# Patient Record
Sex: Female | Born: 1970 | Hispanic: No | Marital: Married | Smoking: Never smoker
Health system: Southern US, Community
[De-identification: ages and names within clinical notes are randomized; demographics above are authoritative.]

## PROBLEM LIST (undated history)

## (undated) DIAGNOSIS — N83209 Unspecified ovarian cyst, unspecified side: Secondary | ICD-10-CM

## (undated) DIAGNOSIS — N736 Female pelvic peritoneal adhesions (postinfective): Secondary | ICD-10-CM

## (undated) DIAGNOSIS — D649 Anemia, unspecified: Secondary | ICD-10-CM

## (undated) DIAGNOSIS — I10 Essential (primary) hypertension: Secondary | ICD-10-CM

## (undated) DIAGNOSIS — K259 Gastric ulcer, unspecified as acute or chronic, without hemorrhage or perforation: Secondary | ICD-10-CM

## (undated) HISTORY — PX: ECTOPIC PREGNANCY SURGERY: SHX613

## (undated) HISTORY — DX: Gastric ulcer, unspecified as acute or chronic, without hemorrhage or perforation: K25.9

## (undated) HISTORY — PX: OVARIAN CYST SURGERY: SHX726

## (undated) HISTORY — DX: Female pelvic peritoneal adhesions (postinfective): N73.6

## (undated) HISTORY — DX: Unspecified ovarian cyst, unspecified side: N83.209

## (undated) HISTORY — DX: Anemia, unspecified: D64.9

---

## 2000-02-11 ENCOUNTER — Ambulatory Visit (HOSPITAL_COMMUNITY): Admission: RE | Admit: 2000-02-11 | Discharge: 2000-02-11 | Payer: Self-pay | Admitting: *Deleted

## 2000-04-06 ENCOUNTER — Ambulatory Visit (HOSPITAL_COMMUNITY): Admission: RE | Admit: 2000-04-06 | Discharge: 2000-04-06 | Payer: Self-pay | Admitting: *Deleted

## 2000-09-07 ENCOUNTER — Inpatient Hospital Stay (HOSPITAL_COMMUNITY): Admission: AD | Admit: 2000-09-07 | Discharge: 2000-09-09 | Payer: Self-pay | Admitting: *Deleted

## 2000-12-14 ENCOUNTER — Inpatient Hospital Stay (HOSPITAL_COMMUNITY): Admission: AD | Admit: 2000-12-14 | Discharge: 2000-12-14 | Payer: Self-pay | Admitting: *Deleted

## 2000-12-14 ENCOUNTER — Encounter: Payer: Self-pay | Admitting: *Deleted

## 2000-12-17 ENCOUNTER — Inpatient Hospital Stay (HOSPITAL_COMMUNITY): Admission: AD | Admit: 2000-12-17 | Discharge: 2000-12-17 | Payer: Self-pay | Admitting: *Deleted

## 2001-01-04 ENCOUNTER — Encounter: Admission: RE | Admit: 2001-01-04 | Discharge: 2001-01-04 | Payer: Self-pay | Admitting: Obstetrics & Gynecology

## 2002-01-13 ENCOUNTER — Other Ambulatory Visit: Admission: RE | Admit: 2002-01-13 | Discharge: 2002-01-13 | Payer: Self-pay | Admitting: Obstetrics & Gynecology

## 2002-04-14 ENCOUNTER — Encounter: Payer: Self-pay | Admitting: Emergency Medicine

## 2002-04-14 ENCOUNTER — Emergency Department (HOSPITAL_COMMUNITY): Admission: EM | Admit: 2002-04-14 | Discharge: 2002-04-14 | Payer: Self-pay | Admitting: Emergency Medicine

## 2005-02-12 ENCOUNTER — Other Ambulatory Visit: Admission: RE | Admit: 2005-02-12 | Discharge: 2005-02-12 | Payer: Self-pay | Admitting: Obstetrics and Gynecology

## 2005-12-14 ENCOUNTER — Inpatient Hospital Stay (HOSPITAL_COMMUNITY): Admission: AD | Admit: 2005-12-14 | Discharge: 2005-12-15 | Payer: Self-pay | Admitting: Obstetrics and Gynecology

## 2005-12-19 ENCOUNTER — Inpatient Hospital Stay (HOSPITAL_COMMUNITY): Admission: AD | Admit: 2005-12-19 | Discharge: 2005-12-19 | Payer: Self-pay | Admitting: Obstetrics and Gynecology

## 2005-12-22 ENCOUNTER — Inpatient Hospital Stay (HOSPITAL_COMMUNITY): Admission: AD | Admit: 2005-12-22 | Discharge: 2005-12-22 | Payer: Self-pay | Admitting: Obstetrics and Gynecology

## 2005-12-25 ENCOUNTER — Inpatient Hospital Stay (HOSPITAL_COMMUNITY): Admission: AD | Admit: 2005-12-25 | Discharge: 2005-12-25 | Payer: Self-pay | Admitting: Obstetrics and Gynecology

## 2006-01-01 ENCOUNTER — Inpatient Hospital Stay (HOSPITAL_COMMUNITY): Admission: AD | Admit: 2006-01-01 | Discharge: 2006-01-01 | Payer: Self-pay | Admitting: Obstetrics and Gynecology

## 2006-01-10 ENCOUNTER — Inpatient Hospital Stay (HOSPITAL_COMMUNITY): Admission: AD | Admit: 2006-01-10 | Discharge: 2006-01-10 | Payer: Self-pay | Admitting: Obstetrics and Gynecology

## 2006-01-26 ENCOUNTER — Ambulatory Visit (HOSPITAL_COMMUNITY): Admission: RE | Admit: 2006-01-26 | Discharge: 2006-01-26 | Payer: Self-pay | Admitting: Obstetrics and Gynecology

## 2006-02-15 ENCOUNTER — Other Ambulatory Visit: Admission: RE | Admit: 2006-02-15 | Discharge: 2006-02-15 | Payer: Self-pay | Admitting: Obstetrics and Gynecology

## 2006-10-27 ENCOUNTER — Emergency Department (HOSPITAL_COMMUNITY): Admission: EM | Admit: 2006-10-27 | Discharge: 2006-10-27 | Payer: Self-pay | Admitting: Emergency Medicine

## 2006-11-30 HISTORY — PX: PELVIC LAPAROSCOPY: SHX162

## 2007-07-05 ENCOUNTER — Ambulatory Visit (HOSPITAL_BASED_OUTPATIENT_CLINIC_OR_DEPARTMENT_OTHER): Admission: RE | Admit: 2007-07-05 | Discharge: 2007-07-05 | Payer: Self-pay | Admitting: Obstetrics and Gynecology

## 2007-12-12 ENCOUNTER — Ambulatory Visit (HOSPITAL_COMMUNITY): Admission: RE | Admit: 2007-12-12 | Discharge: 2007-12-12 | Payer: Self-pay | Admitting: Obstetrics and Gynecology

## 2007-12-18 ENCOUNTER — Inpatient Hospital Stay (HOSPITAL_COMMUNITY): Admission: AD | Admit: 2007-12-18 | Discharge: 2007-12-18 | Payer: Self-pay | Admitting: Obstetrics and Gynecology

## 2008-04-12 ENCOUNTER — Inpatient Hospital Stay (HOSPITAL_COMMUNITY): Admission: AD | Admit: 2008-04-12 | Discharge: 2008-04-12 | Payer: Self-pay | Admitting: Obstetrics & Gynecology

## 2008-04-28 ENCOUNTER — Inpatient Hospital Stay (HOSPITAL_COMMUNITY): Admission: AD | Admit: 2008-04-28 | Discharge: 2008-04-30 | Payer: Self-pay | Admitting: Obstetrics and Gynecology

## 2008-04-29 ENCOUNTER — Encounter: Payer: Self-pay | Admitting: Obstetrics and Gynecology

## 2008-12-01 ENCOUNTER — Emergency Department (HOSPITAL_COMMUNITY): Admission: EM | Admit: 2008-12-01 | Discharge: 2008-12-01 | Payer: Self-pay | Admitting: Emergency Medicine

## 2010-11-06 ENCOUNTER — Emergency Department (HOSPITAL_COMMUNITY)
Admission: EM | Admit: 2010-11-06 | Discharge: 2010-11-06 | Payer: Self-pay | Source: Home / Self Care | Admitting: Emergency Medicine

## 2010-11-19 ENCOUNTER — Ambulatory Visit (HOSPITAL_COMMUNITY)
Admission: RE | Admit: 2010-11-19 | Discharge: 2010-11-19 | Payer: Self-pay | Source: Home / Self Care | Attending: Gastroenterology | Admitting: Gastroenterology

## 2010-11-27 ENCOUNTER — Ambulatory Visit (HOSPITAL_COMMUNITY)
Admission: RE | Admit: 2010-11-27 | Discharge: 2010-11-27 | Payer: Self-pay | Source: Home / Self Care | Attending: Gastroenterology | Admitting: Gastroenterology

## 2011-02-09 LAB — DIFFERENTIAL
Eosinophils Relative: 1 % (ref 0–5)
Lymphocytes Relative: 31 % (ref 12–46)
Monocytes Absolute: 0.4 10*3/uL (ref 0.1–1.0)
Monocytes Relative: 6 % (ref 3–12)
Neutro Abs: 4.5 10*3/uL (ref 1.7–7.7)
Neutrophils Relative %: 62 % (ref 43–77)

## 2011-02-09 LAB — URINALYSIS, ROUTINE W REFLEX MICROSCOPIC
Bilirubin Urine: NEGATIVE
Glucose, UA: NEGATIVE mg/dL
Ketones, ur: NEGATIVE mg/dL
Urobilinogen, UA: 0.2 mg/dL (ref 0.0–1.0)
pH: 7.5 (ref 5.0–8.0)

## 2011-02-09 LAB — CBC
HCT: 33.1 % — ABNORMAL LOW (ref 36.0–46.0)
MCV: 71.2 fL — ABNORMAL LOW (ref 78.0–100.0)
RBC: 4.65 MIL/uL (ref 3.87–5.11)
RDW: 16.7 % — ABNORMAL HIGH (ref 11.5–15.5)
WBC: 7.3 10*3/uL (ref 4.0–10.5)

## 2011-02-09 LAB — COMPREHENSIVE METABOLIC PANEL
Albumin: 3.9 g/dL (ref 3.5–5.2)
Alkaline Phosphatase: 56 U/L (ref 39–117)
BUN: 8 mg/dL (ref 6–23)
CO2: 26 mEq/L (ref 19–32)
Chloride: 107 mEq/L (ref 96–112)
Creatinine, Ser: 0.63 mg/dL (ref 0.4–1.2)
Glucose, Bld: 96 mg/dL (ref 70–99)
Potassium: 4 mEq/L (ref 3.5–5.1)
Total Bilirubin: 0.6 mg/dL (ref 0.3–1.2)
Total Protein: 7.6 g/dL (ref 6.0–8.3)

## 2011-02-09 LAB — LIPASE, BLOOD: Lipase: 28 U/L (ref 11–59)

## 2011-02-09 LAB — URINE MICROSCOPIC-ADD ON

## 2011-04-14 NOTE — Op Note (Signed)
Alisha Thomas, Alisha Thomas                ACCOUNT NO.:  [id number]   MEDICAL RECORD NO.:  [id number]          PATIENT TYPE:  AMB   LOCATION:  [facility]                         FACILITY:  [facility]   PHYSICIAN:  Daniel L. Gottsegen, M.D.DATE OF BIRTH:  03/02/1971   DATE OF PROCEDURE:  07/05/2007  DATE OF DISCHARGE:                               OPERATIVE REPORT   PREOPERATIVE DIAGNOSIS:  Pelvic adhesions.   POSTOPERATIVE DIAGNOSIS:  Pelvic adhesions.   OPERATIONS:  Diagnostic laparoscopy with lysis of peritubular,  paraovarian and pelvic adhesions.   SURGEON:  Daniel L. Eda Paschal, M.D.   ANESTHESIA:  General.   INDICATIONS:  The patient is a 40 year old gravida 3, para 1, AB 2 who  had presented to the office after two ectopic pregnancies.  One of them  had been treated with a right salpingectomy, the other been treated with  a linear salpingostomy.  The patient had a postoperative HSG after the  ectopic pregnancy on the left that showed a patent left tube, right tube  was not patent obviously as it had been removed.  The patient had also  had a previous laparotomy for an ovarian cystectomy in [place] on the left  side.  As a result of the above, it was felt that if the patient tried  to conceive, she would run a very high risk of another tubal pregnancy.  In addition, the patient actually not contraceptive now for a while and  had not even reconceived.  She now enters the hospital for laparoscopy  with lysis of adhesions if appropriate.   FINDINGS:  At the time of surgery, the patient's uterus was mobile,  normal size with several small seedling myoma coming off the posterior  wall, right ovary was somewhat adherent to the broad ligament, right  fallopian tube appeared to be almost completely gone from salpingectomy,  there was a small proximal segment.  On the left side, the patient had a  normal fallopian tube with luxuriant fimbria but it was somewhat kinked  and twisted as it was adherent  both to the ovary and to the broad  ligament as well as the ovary which was densely adherent to the broad  ligament.  This certainly would have effected tubal transfer of a embryo  back into the uterus.  When indigo carmine was introduced, the left tube  was patent.  There was also significant adhesive disease involving the  patient's previous vertical incision.   PROCEDURE:  After adequate general endotracheal anesthesia, the patient  was placed in dorsal lithotomy position, prepped and draped in the usual  sterile manner.  Her bladder was emptied with a Robinson catheter.  A  Jarco catheter was placed into the uterus  both to chromotubate as well  as to elevate the uterus.  A small subumbilical incision was made in  order to place the laparoscope.  It was entered with direct insertion  using an OptiVu to direct it.  This was done without any trauma to  underlying peritoneal structures.  Initially, a very poor view of the  pelvis could be obtained because of the  adhesions from her previous  surgery.  There were a lot of avascular areas in them.  There was no  bowel attached to them and therefore the Neodymium YAG laser was placed  through the operating channel of the laparoscope.  A G4 tip was placed  over the bare fiber, power setting was 12 watts and using the laser, all  the adhesions could be taken down so that now a complete view of the  pelvis could be obtained.  There were some adhesions higher up on the  right that were not taken down they did not involve the pelvis and there  was some concern that there might be bowel adherent to them.  At this  point, now the pelvis could be adequately evaluated.  Two ports were  placed in the pelvis.  They were 5 mm ports, one was suprapubically and  one was in the left lower quadrant.  An irrigator aspirator as well as  other instrumentation was placed.  All peritubular and paraovarian  adhesions on the left could be freed up so that from a  left side that  had dense pelvic adhesive disease preoperatively, it was completely free  postoperatively.  There was one area that was oozing that was controlled  with a bipolar coagulation.  Copious irrigation was done throughout with  sterile saline.  It was finally removed.  The patient now had a much  cleaner pelvis and although it was certainly possible that she would  have repeat ectopic, the surgeon thought her chances were significantly  reduced as a result of the above.  All trocars were removed.  The  pneumoperitoneum evacuated.  The subumbilical fascial incision was  closed with 0 Vicryl.  The subumbilical skin incision was closed with 3-  0 Monocryl.  The two lower incisions were closed with Dermabond.  Blood  loss was minimal.  The patient left the operating room in satisfactory  condition.      Daniel L. Eda Paschal, M.D.  Electronically Signed     DLG/MEDQ  D:  07/05/2007  T:  07/05/2007  Job:  [id number]

## 2011-04-14 NOTE — Consult Note (Signed)
Alisha Thomas, Alisha Thomas NO.:  [id number]   MEDICAL RECORD NO.:  [id number]          PATIENT TYPE:  OUT   LOCATION:  MRI                          FACILITY:  [facility]   PHYSICIAN:  Marlan Palau, M.D.  DATE OF BIRTH:  1971-08-22   DATE OF CONSULTATION:  04/29/2008  DATE OF DISCHARGE:                                 CONSULTATION   HISTORY OF PRESENT ILLNESS:  Alisha Thomas is a 40 year old right-handed  [place] female born on 07/07/1971, with a history of prior tubal  pregnancy, but otherwise fairly unremarkable past medical history.   This patient denies any preexisting history of headaches or migraines,  although she claims she has had generalized headache for about 3 days  prior to admission and took Tylenol for this.  The patient delivered her  child on Apr 28, 2008, by vaginal delivery, but received an epidural  anesthesia for the delivery.  This occurred around 4:30 p.m. on Apr 28, 2008.  The patient was awakened with a headache at 1 a.m. on Apr 29, 2008.  The patient describes the headache as being a constant, boring,  achy pain in the right frontotemporal area.  The patient has noted that  she will get droopiness of the eyelid when the headache gets severe on  the right side.  This will come and go.  The patient denies any visual  field changes, has had no numbness or weakness on the face, arms, legs,  slurred speech, or speech changes.  The patient denies gait disturbance.  The patient, however, does note significant pain in the groin areas  since the epidural, and is not able lift or legs up well and not able to  walk well due to pain.  Due to the new onset headache, the patient is  being seen by Neurology for further evaluation.  Again, no nausea or  vomiting is noted with the headache.   PAST MEDICAL HISTORY:  Significant for;  1. New onset of right-sided headache with right eye ptosis, as above.  2. Status post delivery on Apr 28, 2008  3. History  of tubal pregnancy in the past.  4. History of iron-deficiency anemia in the past.   ALLERGIES:  The patient's allergies listed include PENICILLIN, although  the patient claims no known allergies.   CURRENT MEDICATIONS:  Include prenatal vitamins; Senokot; Tylenol, if  needed; oxycodone, if needed for pain; and Ambien 5 mg at night for  sleep.   SOCIAL HISTORY:  Does not currently smoke or drink.  This patient is  married, lives in the [place], [place] area.  Does not work.  He is a Consulting civil engineer.  This delivery represents her second child, both  children are alive and well.   FAMILY MEDICAL HISTORY:  Notable that mother is alive with hypertension.  Father died with asthma.  The patient has four brothers and four  sisters, one brother has diabetes.  There is no family history of  migraine headache.   REVIEW OF SYSTEMS:  Notable for no recent fevers or chills.  The patient  did note slight headache three days prior to delivery that was a  generalized in nature, unlike her current headache.  The patient has had  no vision changes or neck stiffness.  Denies any jaw pain.  Denies any  shortness of breath, chest pains, abdominal pain, nausea, or vomiting.  She has had some pain in the proximal legs, preventing her from walking  well and otherwise has not had any focal numbness or weakness on the  arms or legs.   PHYSICAL EXAMINATION:  VITAL SIGNS:  Blood pressure 105/63; heart rate  66; respiratory rate 20; temperature, afebrile.  GENERAL:  This patient is a fairly well-developed [place] female, who is  alert and cooperative at the time of examination.  HEENT:  Atraumatic.  Eyes, pupils at this time are equal, round, and  reactive to light.  Discs are soft and flat bilaterally.  No ptosis is  seen.  NECK:  Supple.  No carotid bruits noted.  RESPIRATORY:  Clear.  CARDIOVASCULAR:  Regular rate and rhythm.  No obvious murmurs or rubs  noted.  EXTREMITIES:  Without significant  edema.  NEUROLOGICAL:  Cranial nerves as above.  Facial symmetry is present.  Again, no ptosis is seen.  The patient has full extraocular movements,  has good sensation of face to pinprick, soft touch bilaterally.  Has  good strength to facial muscle, muscle to head turn, and shoulder shrugs  bilaterally.  Smile is symmetric.  The patient has good strength in all  fours with exception that she is not able lift the legs up off the bed  very well due to pain.  The patient has good finger-nose-finger  bilaterally.  Cannot perform toe-to-finger due to inability to lift the  legs due to pain.  The patient has good pinprick and soft touch  bilaterally, sensation throughout.  Deep tendon reflexes symmetric  throughout with depression of ankle jerk reflexes seen bilaterally.  Toes are neutral bilaterally.  The patient was not ambulated.   LABORATORY VALUES:  Notable for white count of 9.7, hemoglobin 11.3,  hematocrit 33.2, MCV of 84.6, and platelets of 144.  RPR was  unremarkable and was nonreactive.   IMPRESSION:  1. New onset headache, right frontotemporal area.  2. Recent delivery with epidural anesthesia.   This patient's current headaches are not consistent with spinal headache  as they are better with sitting and standing, worse with lying down.  Headaches are unusual for true migraine as she describes a constant  boring pain rather than a throbbing pain.  Migraine can produce a Horner  syndrome during the headache itself, but the patient has no prior  history of headache.  Given the atypical features of the headache, we  will need to pursue further workup to rule out underlying organic  neurologic disease.  Specifically with the current headache type and  Horner syndrome, would consider a right carotid dissection.   PLAN:  1. CT of the head today.  2. The patient will be sent for an MRI scan of the brain.  3. MRI angiogram of the intracranial and extracranial vessels.  4. MRV,  rule out venous sinus thrombosis.  No reported double vision      per se, I doubt pituitary apoplexy.  We will follow the patient      while in-house, headaches will be treated with analgesics.      Marlan Palau, M.D.  Electronically Signed     CKW/MEDQ  D:  04/29/2008  T:  04/30/2008  Job:  [id number]   cc:   Randye Lobo, M.D.  Fax: [phone]   [facility]  [address], [address]  [address]

## 2011-08-26 LAB — COMPREHENSIVE METABOLIC PANEL
Alkaline Phosphatase: 109
BUN: 8
CO2: 28
Chloride: 103
Creatinine, Ser: 1.17
GFR calc Af Amer: >60
GFR calc non Af Amer: 51 — ABNORMAL LOW
Potassium: 4
Sodium: 136

## 2011-08-26 LAB — CBC
Hemoglobin: 12.8
MCHC: 34.1
MCV: 87.8
Platelets: 144 — ABNORMAL LOW
Platelets: 181
RBC: 4.37
RDW: 14.6
WBC: 9.7

## 2011-08-27 LAB — COMPREHENSIVE METABOLIC PANEL
ALT: 13
AST: 19
Albumin: 2.5 — ABNORMAL LOW
Calcium: 9.2
Chloride: 101
Creatinine, Ser: 0.84
GFR calc non Af Amer: >60
Glucose, Bld: 91
Total Bilirubin: 0.5

## 2011-08-27 LAB — CBC
HCT: 33.8 — ABNORMAL LOW
Hemoglobin: 11.6 — ABNORMAL LOW
MCHC: 34.3
Platelets: 188
RBC: 3.93
RDW: 15.2

## 2011-08-27 LAB — LACTATE DEHYDROGENASE: LDH: 178

## 2011-09-03 ENCOUNTER — Encounter: Payer: Self-pay | Admitting: Gynecology

## 2011-09-03 DIAGNOSIS — N83209 Unspecified ovarian cyst, unspecified side: Secondary | ICD-10-CM | POA: Insufficient documentation

## 2011-09-03 DIAGNOSIS — N736 Female pelvic peritoneal adhesions (postinfective): Secondary | ICD-10-CM | POA: Insufficient documentation

## 2011-09-10 ENCOUNTER — Encounter: Payer: Self-pay | Admitting: Obstetrics and Gynecology

## 2011-09-10 ENCOUNTER — Ambulatory Visit (INDEPENDENT_AMBULATORY_CARE_PROVIDER_SITE_OTHER): Payer: BC Managed Care – PPO | Admitting: Obstetrics and Gynecology

## 2011-09-10 ENCOUNTER — Other Ambulatory Visit (HOSPITAL_COMMUNITY)
Admission: RE | Admit: 2011-09-10 | Discharge: 2011-09-10 | Disposition: A | Discharge status: Home / Self Care | Payer: BC Managed Care – PPO | Source: Ambulatory Visit | Attending: Obstetrics and Gynecology | Admitting: Obstetrics and Gynecology

## 2011-09-10 VITALS — BP 120/60 | Ht 64.0 in | Wt 126.0 lb

## 2011-09-10 DIAGNOSIS — Z01419 Encounter for gynecological examination (general) (routine) without abnormal findings: Secondary | ICD-10-CM | POA: Insufficient documentation

## 2011-09-10 DIAGNOSIS — N949 Unspecified condition associated with female genital organs and menstrual cycle: Secondary | ICD-10-CM

## 2011-09-10 DIAGNOSIS — N938 Other specified abnormal uterine and vaginal bleeding: Secondary | ICD-10-CM

## 2011-09-10 DIAGNOSIS — K279 Peptic ulcer, site unspecified, unspecified as acute or chronic, without hemorrhage or perforation: Secondary | ICD-10-CM

## 2011-09-10 NOTE — Progress Notes (Signed)
Patient is a 40 year old gravida 4 para 2 AB 2 who came to see me today as a new patient for both an annual GYN exam at problem visit. She used to be an infertility patient of mine but have not seen her since 2008. Since her last delivery she has had DUB consisting of frequent bleeding approximately every 15 days associated with extremely heavy periods. Recently she was diagnosed with peptic ulcer and so Dr. Evette Cristal and was also found to be anemic. She is currently on iron. I think he thinks that some of the anemia is due to her periods. She has never had a mammogram.  Physical examination: HEENT within normal limits. Neck: Thyroid not large. No masses. Supraclavicular nodes: not enlarged. Breasts: Examined in both sitting midline position. No skin changes and no masses. Abdomen: Soft no guarding rebound or masses or hernia. Pelvic: External: Within normal limits. BUS: Within normal limits. Vaginal:within normal limits. Good estrogen effect. No evidence of cystocele rectocele or enterocele. Cervix: clean. Uterus: Normal size and shape. Adnexa: No masses. Rectovaginal exam: Confirmatory and negative. Extremities: Within normal limits.  Assessment: Metromenorrhagia.  Plan: SIH scheduled. CBC and TSH done. We will get Dr. Luan Moore records. We had a preliminary discussion about her option. Information given to her. She's not sure about future pregnancies. Please note that the moment she is not having intercourse. Patient to schedule mammogram.

## 2011-09-11 ENCOUNTER — Encounter: Payer: Self-pay | Admitting: Obstetrics and Gynecology

## 2011-09-14 ENCOUNTER — Encounter: Payer: Self-pay | Admitting: Obstetrics and Gynecology

## 2011-09-28 ENCOUNTER — Ambulatory Visit: Payer: Self-pay | Admitting: Obstetrics and Gynecology

## 2011-10-07 ENCOUNTER — Ambulatory Visit (INDEPENDENT_AMBULATORY_CARE_PROVIDER_SITE_OTHER): Payer: BC Managed Care – PPO

## 2011-10-07 ENCOUNTER — Other Ambulatory Visit: Payer: Self-pay | Admitting: Obstetrics and Gynecology

## 2011-10-07 ENCOUNTER — Ambulatory Visit (INDEPENDENT_AMBULATORY_CARE_PROVIDER_SITE_OTHER): Payer: BC Managed Care – PPO | Admitting: Obstetrics and Gynecology

## 2011-10-07 ENCOUNTER — Telehealth: Payer: Self-pay | Admitting: *Deleted

## 2011-10-07 DIAGNOSIS — D219 Benign neoplasm of connective and other soft tissue, unspecified: Secondary | ICD-10-CM

## 2011-10-07 DIAGNOSIS — D252 Subserosal leiomyoma of uterus: Secondary | ICD-10-CM

## 2011-10-07 DIAGNOSIS — D251 Intramural leiomyoma of uterus: Secondary | ICD-10-CM

## 2011-10-07 DIAGNOSIS — N949 Unspecified condition associated with female genital organs and menstrual cycle: Secondary | ICD-10-CM

## 2011-10-07 DIAGNOSIS — D259 Leiomyoma of uterus, unspecified: Secondary | ICD-10-CM

## 2011-10-07 DIAGNOSIS — N938 Other specified abnormal uterine and vaginal bleeding: Secondary | ICD-10-CM

## 2011-10-07 NOTE — Telephone Encounter (Signed)
They are not big enough to remove yet.

## 2011-10-07 NOTE — Progress Notes (Signed)
Patient came back today for SIH. Her TSH and CBC were normal. Her Pap smear was normal. The study was ordered because of meno- menorrhagia for past 3 years. On ultrasound today her uterus shows a heterogeneous echo pattern. Her endometrial stripe is 9.5 mm. She has 2 small myomas in the myometrium of 2 and 1 cm. Her ovaries are normal. Saline was introduced into the endometrial cavity. She has a somewhat arcuate uterus but there is no intrauterine cavity defect. Endometrial biopsy was obtained. Cul-de-sac was free of fluid.  Assessment: Dysfunctional uterine bleeding, fibroids  Plan: We discussed oral contraceptives, fibroids, endometrial ablation, a Merina IUD. Patient had discussed ablation with her husband and they felt that was not wise idea as she may try to conceive in the future. She will decide what to do I suspect she'll do a Mirena IUD. She will inform us.

## 2011-10-07 NOTE — Telephone Encounter (Signed)
Patient informed. 

## 2011-10-07 NOTE — Telephone Encounter (Signed)
Patient had a question since she has seen you today.  Wants to know if you "Prefer" to do surgery to remove the fibroids? Not referring to the bleeding issue but just to remove the fibroids.

## 2012-01-25 ENCOUNTER — Other Ambulatory Visit: Payer: Self-pay | Admitting: *Deleted

## 2012-01-25 ENCOUNTER — Ambulatory Visit (INDEPENDENT_AMBULATORY_CARE_PROVIDER_SITE_OTHER): Payer: BC Managed Care – PPO | Admitting: Obstetrics and Gynecology

## 2012-01-25 ENCOUNTER — Other Ambulatory Visit: Payer: Self-pay | Admitting: Obstetrics and Gynecology

## 2012-01-25 ENCOUNTER — Encounter: Payer: Self-pay | Admitting: Obstetrics and Gynecology

## 2012-01-25 ENCOUNTER — Telehealth: Payer: Self-pay | Admitting: *Deleted

## 2012-01-25 DIAGNOSIS — R300 Dysuria: Secondary | ICD-10-CM

## 2012-01-25 DIAGNOSIS — N390 Urinary tract infection, site not specified: Secondary | ICD-10-CM

## 2012-01-25 DIAGNOSIS — N920 Excessive and frequent menstruation with regular cycle: Secondary | ICD-10-CM

## 2012-01-25 DIAGNOSIS — Z3049 Encounter for surveillance of other contraceptives: Secondary | ICD-10-CM

## 2012-01-25 LAB — URINALYSIS W MICROSCOPIC + REFLEX CULTURE
Glucose, UA: NEGATIVE mg/dL
Nitrite: NEGATIVE
Protein, ur: NEGATIVE mg/dL

## 2012-01-25 MED ORDER — LEVONORGESTREL 20 MCG/24HR IU IUD: INTRAUTERINE_SYSTEM | Freq: Once | INTRAUTERINE | Status: DC

## 2012-01-25 MED ORDER — NITROFURANTOIN MONOHYD MACRO 100 MG PO CAPS: 100.0000 mg | ORAL_CAPSULE | Freq: Two times a day (BID) | ORAL | Status: AC

## 2012-01-25 NOTE — Telephone Encounter (Signed)
Lm for patient to call about Mirena IUD benefits.  Covered at 100%.

## 2012-01-25 NOTE — Progress Notes (Signed)
Patient came to see me today with a 10 day history of intermittent sharp vaginal pain which only lasts several seconds. In addition starting for 5 days ago a history of severe dysuria without urgency or frequency. Patient also had a very happy cycle which is now coming to and end. She is getting approved for a Mirena IUD. She does have small fibroids.  Pelvic exam: External within normal limits. BUS within normal limits. Vaginal exam within normal limits. Cervix is clean without lesions. Uterus is top normal size and shape. Adnexa failed to reveal masses. Rectovaginal examination is confirmatory and without masses. Urinalysis done and difficult to interpret due to menstrual cycle.  Assessment: Urinary tract infection. Menorrhagia.  Plan: Macrobid twice a day for 7 days. Recheck urine in one week. Amy to get her approved for Mirena IUD.

## 2012-01-29 ENCOUNTER — Other Ambulatory Visit: Payer: Self-pay | Admitting: *Deleted

## 2012-01-29 DIAGNOSIS — Z3049 Encounter for surveillance of other contraceptives: Secondary | ICD-10-CM

## 2012-01-29 MED ORDER — LEVONORGESTREL 20 MCG/24HR IU IUD: INTRAUTERINE_SYSTEM | Freq: Once | INTRAUTERINE | Status: DC

## 2012-02-03 ENCOUNTER — Ambulatory Visit: Payer: BC Managed Care – PPO | Admitting: Obstetrics and Gynecology

## 2012-02-04 ENCOUNTER — Ambulatory Visit (INDEPENDENT_AMBULATORY_CARE_PROVIDER_SITE_OTHER): Payer: BC Managed Care – PPO | Admitting: Obstetrics and Gynecology

## 2012-02-04 ENCOUNTER — Other Ambulatory Visit: Payer: Self-pay | Admitting: Obstetrics and Gynecology

## 2012-02-04 DIAGNOSIS — Z3049 Encounter for surveillance of other contraceptives: Secondary | ICD-10-CM

## 2012-02-04 DIAGNOSIS — Z30430 Encounter for insertion of intrauterine contraceptive device: Secondary | ICD-10-CM

## 2012-02-04 DIAGNOSIS — N949 Unspecified condition associated with female genital organs and menstrual cycle: Secondary | ICD-10-CM

## 2012-02-04 DIAGNOSIS — R102 Pelvic and perineal pain: Secondary | ICD-10-CM

## 2012-02-04 LAB — URINALYSIS W MICROSCOPIC + REFLEX CULTURE
Casts: NONE SEEN
Crystals: NONE SEEN
Glucose, UA: NEGATIVE mg/dL
Ketones, ur: NEGATIVE mg/dL
Leukocytes, UA: NEGATIVE
Specific Gravity, Urine: 1.01 (ref 1.005–1.030)
pH: 7 (ref 5.0–8.0)

## 2012-02-04 NOTE — Progress Notes (Signed)
Patient came to see me today for a Mirena IUD insertion. We discussed possible risks including pelvic infection and perforation. She would like to proceed. When I last saw her her she had a UTI. She was treated with Macrobid. Her culture showed strep agalactiae. No sensitivities were done due its usual response to penicillin. She is still having discomfort but is more in her left lower quadrant without urinary symptoms. She gave [place] a urine today for followup culture. She also had microscopic hematuria last time but was bleeding. Today she is not bleeding.  Pelvic exam: External within normal limits. BUS within normal limits. Vaginal exam within normal limits. Cervix is clean without lesions. Uterus is normal size and shape. Adnexa failed to reveal masses. Rectovaginal examination is confirmatory and without masses. There is no pelvic tenderness. Kennon Portela present.  Assessment: #1. Menorrhagia #2. Possible persistent UTI #3. Pelvic pain #4. Microscopic hematuria  Plan: Urine culture obtained. Mirena IUD inserted with ease. Obviously we will treat any persistent UTI. Followup in 6 weeks with ultrasound.

## 2012-02-06 LAB — URINE CULTURE: Colony Count: NO GROWTH

## 2012-02-10 ENCOUNTER — Other Ambulatory Visit: Payer: Self-pay | Admitting: *Deleted

## 2012-02-10 ENCOUNTER — Telehealth: Payer: Self-pay | Admitting: *Deleted

## 2012-02-10 DIAGNOSIS — R3129 Other microscopic hematuria: Secondary | ICD-10-CM

## 2012-02-10 NOTE — Telephone Encounter (Signed)
Message copied by Libby Maw on Wed Feb 10, 2012  3:06 PM ------      Message from: Venora Maples      Created: Tue Feb 09, 2012 12:47 PM       THIS IS THE ONE THAT NEEDS A UROLOGY REFERRAL AND IS REQUESTING ASAP DUE TO CONTINUED PAIN. THANKS!

## 2012-02-10 NOTE — Telephone Encounter (Signed)
appt set for 02/11/12 @11 :15 with Dr. Annabell Howells. Records faxed.

## 2012-02-18 ENCOUNTER — Telehealth: Payer: Self-pay | Admitting: *Deleted

## 2012-02-18 DIAGNOSIS — R102 Pelvic and perineal pain: Secondary | ICD-10-CM

## 2012-02-18 NOTE — Telephone Encounter (Signed)
Pt informed with the below note, order in computer for ultrasound, pt transferred to appointment desk.

## 2012-02-18 NOTE — Telephone Encounter (Signed)
I could not find the report in her record. Have Dr. Belva Crome office  fax [place] to report. I do not need the CD.

## 2012-02-18 NOTE — Telephone Encounter (Signed)
Pt has appointment with Dr. Annabell Howells at [facility] urology,Dr. Annabell Howells did a CT scan which showed fluid on her fallopian tubes per pt? Pt would like you to review report.( on paper chart)  Please advise

## 2012-02-18 NOTE — Telephone Encounter (Signed)
Tell patient it appears that she may have a dilated fallopian tube. This could explain some of her pain. I believe she is scheduled for a pelvic ultrasound in our office. That is the best way to confirm whether she has a dilated tube or not. On the day of the ultrasound we will discuss it. If she does not have an appointment please make her one.

## 2012-02-18 NOTE — Telephone Encounter (Signed)
The faxed copy of the ct results are on her paper chart in your office.

## 2012-02-22 ENCOUNTER — Ambulatory Visit (INDEPENDENT_AMBULATORY_CARE_PROVIDER_SITE_OTHER): Payer: BC Managed Care – PPO | Admitting: Obstetrics and Gynecology

## 2012-02-22 ENCOUNTER — Ambulatory Visit (INDEPENDENT_AMBULATORY_CARE_PROVIDER_SITE_OTHER): Payer: BC Managed Care – PPO

## 2012-02-22 ENCOUNTER — Other Ambulatory Visit: Payer: Self-pay | Admitting: Obstetrics and Gynecology

## 2012-02-22 DIAGNOSIS — R102 Pelvic and perineal pain unspecified side: Secondary | ICD-10-CM

## 2012-02-22 DIAGNOSIS — N921 Excessive and frequent menstruation with irregular cycle: Secondary | ICD-10-CM

## 2012-02-22 DIAGNOSIS — N8300 Follicular cyst of ovary, unspecified side: Secondary | ICD-10-CM

## 2012-02-22 DIAGNOSIS — D259 Leiomyoma of uterus, unspecified: Secondary | ICD-10-CM

## 2012-02-22 DIAGNOSIS — N949 Unspecified condition associated with female genital organs and menstrual cycle: Secondary | ICD-10-CM

## 2012-02-22 DIAGNOSIS — D251 Intramural leiomyoma of uterus: Secondary | ICD-10-CM

## 2012-02-22 DIAGNOSIS — D219 Benign neoplasm of connective and other soft tissue, unspecified: Secondary | ICD-10-CM

## 2012-02-22 DIAGNOSIS — Z975 Presence of (intrauterine) contraceptive device: Secondary | ICD-10-CM

## 2012-02-22 NOTE — Progress Notes (Signed)
Patient came back today for an ultrasound with a one-month history of pelvic pain and menorrhagia. Earlier this month we inserted an IUD for control of her periods. We have referred her to urologist for microscopic hematuria and on CT scan there was a suggestion that she had a left hydrosalpinx. She said that her pain is actually improved. She continues to have spotting daily with her new IUD. On ultrasound today the uterus showed 2 small fibroids which are stable from her last ultrasound. The largest is slightly under 2 cm. Her endometrial echo is thin at 4.7 mm. Her IUD is in the proper position. Both ovaries are normal. We carefully looked for hydrosalpinx but could find none. Her cul-de-sac is free of fluid.  Assessment: Pelvic pain. Intermenstrual bleeding. Fibroids.  Plan: Patient reassured. She will inform if abnormal bleeding persists. She will inform if pain gets worse. Otherwise we will see her again in October for annual exam.

## 2012-03-09 ENCOUNTER — Telehealth: Payer: Self-pay | Admitting: *Deleted

## 2012-03-09 NOTE — Telephone Encounter (Signed)
She should not be worried as it is very common. Hopefully within 2 weeks it'll stop. If not suggest office visit.

## 2012-03-09 NOTE — Telephone Encounter (Signed)
Pt informed with the below note. 

## 2012-03-09 NOTE — Telephone Encounter (Signed)
Pt had IUD placement on 02/04/12 and has been having vaginal bleeding since 02/12/12. I explained to pt that sometimes bleeding occurs when IUD is placed. Pt told me to ask you if she should be worried & how longer should the bleeding last? Please advise

## 2012-03-10 ENCOUNTER — Other Ambulatory Visit: Payer: Self-pay | Admitting: Obstetrics and Gynecology

## 2012-03-10 ENCOUNTER — Ambulatory Visit (INDEPENDENT_AMBULATORY_CARE_PROVIDER_SITE_OTHER): Payer: BC Managed Care – PPO | Admitting: Obstetrics and Gynecology

## 2012-03-10 DIAGNOSIS — L293 Anogenital pruritus, unspecified: Secondary | ICD-10-CM

## 2012-03-10 DIAGNOSIS — Z30431 Encounter for routine checking of intrauterine contraceptive device: Secondary | ICD-10-CM

## 2012-03-10 DIAGNOSIS — R300 Dysuria: Secondary | ICD-10-CM

## 2012-03-10 DIAGNOSIS — N898 Other specified noninflammatory disorders of vagina: Secondary | ICD-10-CM

## 2012-03-10 LAB — URINALYSIS W MICROSCOPIC + REFLEX CULTURE
Bilirubin Urine: NEGATIVE
Casts: NONE SEEN
Crystals: NONE SEEN
Glucose, UA: NEGATIVE mg/dL
Ketones, ur: NEGATIVE mg/dL
Specific Gravity, Urine: 1.01 (ref 1.005–1.030)
Urobilinogen, UA: 0.2 mg/dL (ref 0.0–1.0)

## 2012-03-10 MED ORDER — FLUCONAZOLE 150 MG PO TABS: 150.0000 mg | ORAL_TABLET | Freq: Every day | ORAL | Status: AC

## 2012-03-10 NOTE — Progress Notes (Signed)
Patient came back today because she wants her IUD removed. Since we placed it 5 weeks ago she has had persistent bleeding, menstrual cramping, and mastodynia. Her mastodynia is diffuse. She can feel no masses. We had asked her to go for a mammogram but so far she has not. She has also had vulvar and vaginal itching and was treated with antibiotics last month.  Exam: Kennon Portela present. Breasts: No masses or skin changes. Patient was examined both in the sitting and lying position. Pelvic exam: External within normal limits. Vaginal exam shows fairly heavy bleeding originating from the os. Wet prep was negative but was contaminated by blood. Cervix is clean. IUD string was not visible.  Assessment: Mastodynia. Side effects from IUD. Yeast vaginitis.  Plan: Reassured her that I felt no masses on breast exam. Asked her to go get a mammogram. Treated her with Diflucan for 3 days for yeast. Mirena IUD removed. String was found in the endocervical canal. Patient is not sexually active so no birth control needed. She will return when the above symptoms go away to discuss other options for menorrhagia.

## 2012-03-12 LAB — URINE CULTURE: Colony Count: 10000

## 2012-03-16 ENCOUNTER — Other Ambulatory Visit: Payer: BC Managed Care – PPO

## 2012-03-16 ENCOUNTER — Ambulatory Visit: Payer: BC Managed Care – PPO | Admitting: Obstetrics and Gynecology

## 2012-03-17 ENCOUNTER — Ambulatory Visit: Payer: BC Managed Care – PPO | Admitting: Obstetrics and Gynecology

## 2012-03-25 ENCOUNTER — Ambulatory Visit (INDEPENDENT_AMBULATORY_CARE_PROVIDER_SITE_OTHER): Payer: BC Managed Care – PPO | Admitting: Obstetrics and Gynecology

## 2012-03-25 DIAGNOSIS — N898 Other specified noninflammatory disorders of vagina: Secondary | ICD-10-CM

## 2012-03-25 DIAGNOSIS — N899 Noninflammatory disorder of vagina, unspecified: Secondary | ICD-10-CM

## 2012-03-25 DIAGNOSIS — N9089 Other specified noninflammatory disorders of vulva and perineum: Secondary | ICD-10-CM

## 2012-03-25 LAB — WET PREP FOR TRICH, YEAST, CLUE: Yeast Wet Prep HPF POC: NONE SEEN

## 2012-03-25 MED ORDER — NYSTATIN-TRIAMCINOLONE 100000-0.1 UNIT/GM-% EX OINT: TOPICAL_OINTMENT | Freq: Two times a day (BID) | CUTANEOUS | Status: DC

## 2012-03-25 NOTE — Progress Notes (Signed)
Patient came in today with a one-week history of feeling bumps on the outside of her labia that are tender. She is finished her treatment for yeast vaginitis.  Exam: Kennon Portela present. External: On the perineum there is a firm  2 mm nodule below the surface of the skin which is slightly tender and feels like a sebaceous cyst. There is also similar one  on her left labia of comparable size. Vagina: Within normal limits with negative wet prep.  Assessment: Vulvitis with small sebacous cysts.  Plan: Warm soaks to perineum. Mytrex cream.

## 2012-03-30 ENCOUNTER — Ambulatory Visit: Payer: BC Managed Care – PPO | Admitting: Obstetrics and Gynecology

## 2012-10-20 ENCOUNTER — Other Ambulatory Visit: Payer: Self-pay | Admitting: Obstetrics and Gynecology

## 2012-10-20 DIAGNOSIS — Z1231 Encounter for screening mammogram for malignant neoplasm of breast: Secondary | ICD-10-CM

## 2012-11-09 ENCOUNTER — Ambulatory Visit (HOSPITAL_COMMUNITY)
Admission: RE | Admit: 2012-11-09 | Discharge: 2012-11-09 | Disposition: A | Discharge status: Home / Self Care | Payer: BC Managed Care – PPO | Source: Ambulatory Visit | Attending: Obstetrics and Gynecology | Admitting: Obstetrics and Gynecology

## 2012-11-09 DIAGNOSIS — Z1231 Encounter for screening mammogram for malignant neoplasm of breast: Secondary | ICD-10-CM | POA: Insufficient documentation

## 2012-11-10 ENCOUNTER — Other Ambulatory Visit (HOSPITAL_COMMUNITY)
Admission: RE | Admit: 2012-11-10 | Discharge: 2012-11-10 | Disposition: A | Discharge status: Home / Self Care | Payer: BC Managed Care – PPO | Source: Ambulatory Visit | Attending: Obstetrics and Gynecology | Admitting: Obstetrics and Gynecology

## 2012-11-10 ENCOUNTER — Encounter: Payer: Self-pay | Admitting: Obstetrics and Gynecology

## 2012-11-10 ENCOUNTER — Ambulatory Visit (INDEPENDENT_AMBULATORY_CARE_PROVIDER_SITE_OTHER): Payer: BC Managed Care – PPO | Admitting: Obstetrics and Gynecology

## 2012-11-10 VITALS — BP 120/70 | Ht 66.0 in | Wt 125.0 lb

## 2012-11-10 DIAGNOSIS — Z01419 Encounter for gynecological examination (general) (routine) without abnormal findings: Secondary | ICD-10-CM

## 2012-11-10 NOTE — Progress Notes (Signed)
Patient came to see me today for her annual GYN exam. She is complaining of dyspareunia. It actually has been a problem her entire sexual life but she did not have the courage to bring it up until today.  It occurs on entry and also she is uncomfortable at the introitus when they are done. She is unsure she lubricates well. 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 She had a Mirena IUD. We removed in April because of dysfunctional uterine bleeding. She is doing well without it without abnormal bleeding. 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 She has always had normal Pap smears. She has had previous surgeries for ovarian cystectomy, ectopic pregnancy. She only has one fallopian tube. She also had a laparoscopy in 2008 for  lysis of adhesions.She has several small fibroids on ultrasound( see report). She has always had normal Pap smears. Her last Pap smear was 2012. She does her lab through Dr. Charlott Rakes office. Patient has maternal aunt who died after developing breast cancer in her 30s. She has a very strong family history of cancer in addition to her but she is not sure where the primaries were.She is up-to-date on mammograms.  Physical examination:Kim Julian Reil present. HEENT within normal limits. Neck: Thyroid not large. No masses. Supraclavicular nodes: not enlarged. Breasts: Examined in both sitting and lying  position. No skin changes and no masses. Abdomen: Soft no guarding rebound or masses or hernia. Pelvic: Patient has had a clitoridectomy. All is healed without scarring: Other than this all else is within normal limits. BUS: Within normal limits. Vaginal:within normal limits. Fair estrogen effect. No evidence of cystocele rectocele or enterocele. Cervix: clean. Uterus: Normal size and shape. Adnexa: No masses. Rectovaginal exam: Confirmatory and negative. Extremities: Within normal limits.  Assessment: #1. Dyspareunia related to lack of lubrication and  clitoridectomy. #2. Fibroids #3. Pelvic inflammatory disease-in active  Plan:Patient to try halo gyn gel. If not the answer will return for vaginal estrogen. The new Pap smear guidelines were discussed with the patient. Pap done  at patient's request. Genetic counseling because of family history.

## 2012-11-10 NOTE — Patient Instructions (Signed)
Continue yearly mammograms. Make appointment at cancer Center for genetic testing due to family history of breast cancer and other cancers.

## 2012-11-11 LAB — URINALYSIS W MICROSCOPIC + REFLEX CULTURE
Bilirubin Urine: NEGATIVE
Casts: NONE SEEN
Ketones, ur: NEGATIVE mg/dL
Nitrite: NEGATIVE
pH: 7 (ref 5.0–8.0)

## 2012-11-12 LAB — URINE CULTURE: Colony Count: 40000

## 2012-11-13 IMAGING — US US ABDOMEN COMPLETE
1 series · 14 of 25 positions shown · non-contrast
Comparison: None.

CLINICAL DATA: Stomach pain

COMPLETE ABDOMINAL ULTRASOUND

[Series 1: us abdomen complete · 0.19mm/px · 14 of 94 slices shown]
[im 1/94]
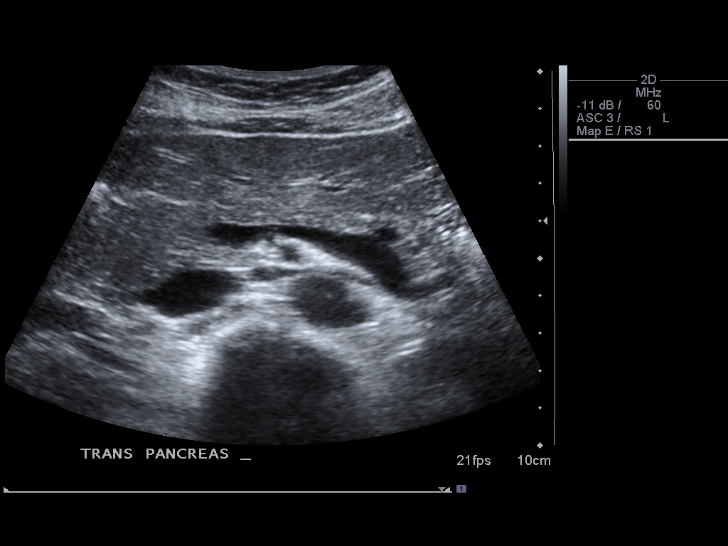
[im 8/94]
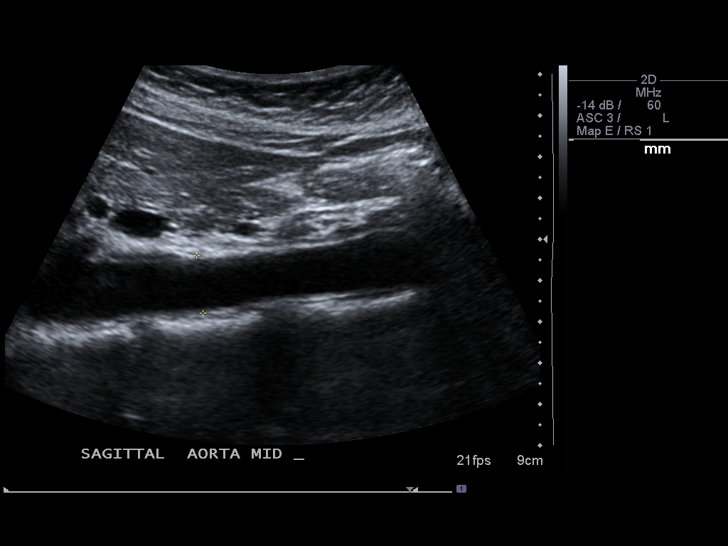
[im 16/94]
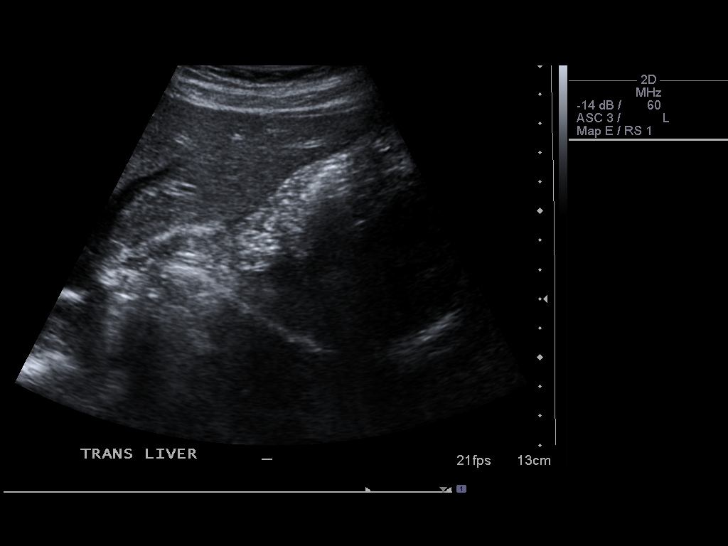
[im 24/94]
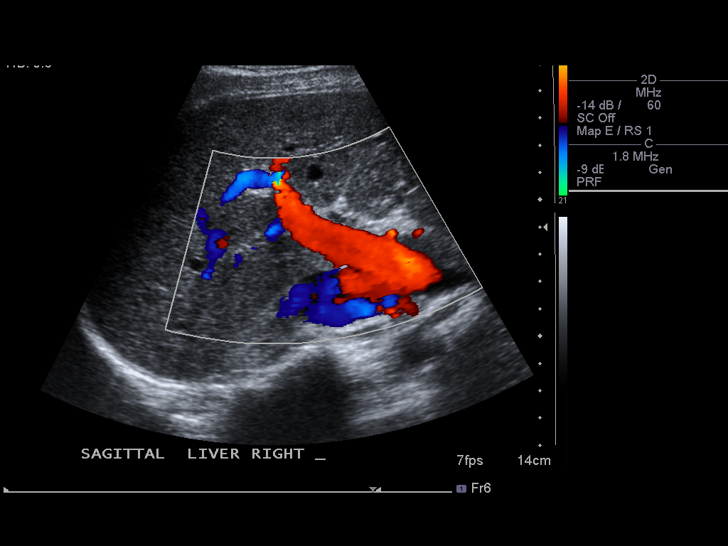
[im 32/94]
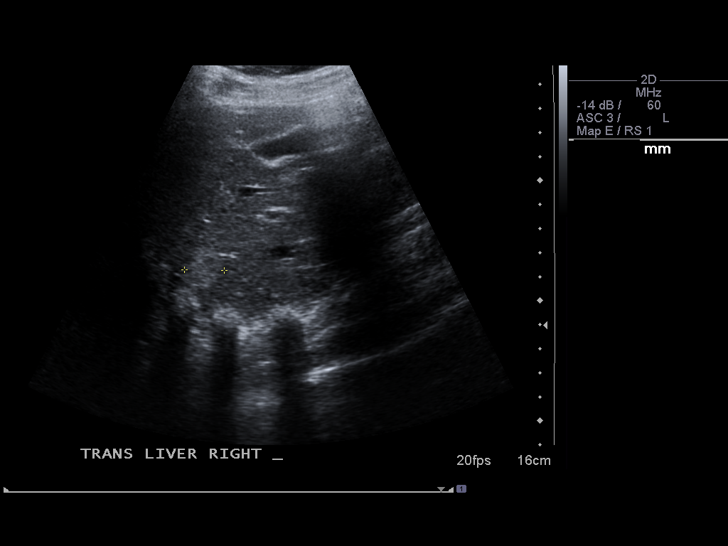
[im 35/94]
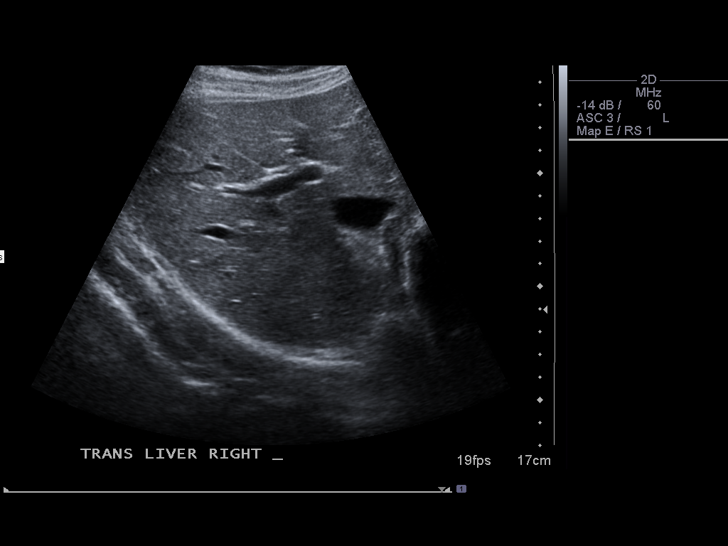
[im 43/94]
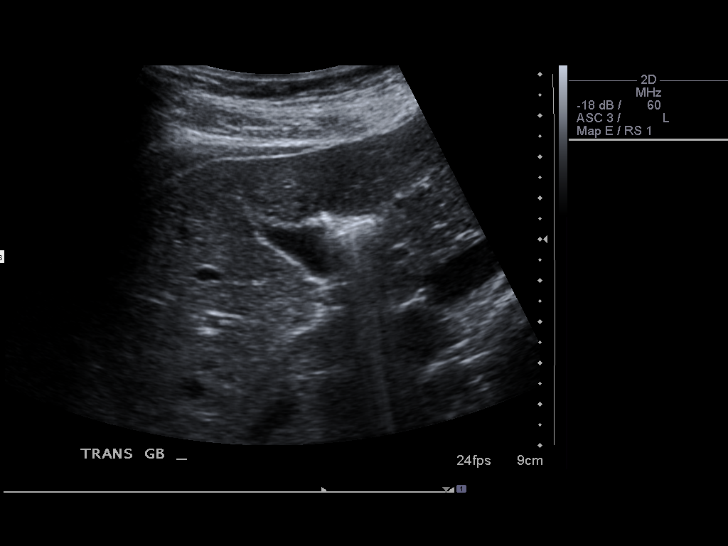
[im 51/94]
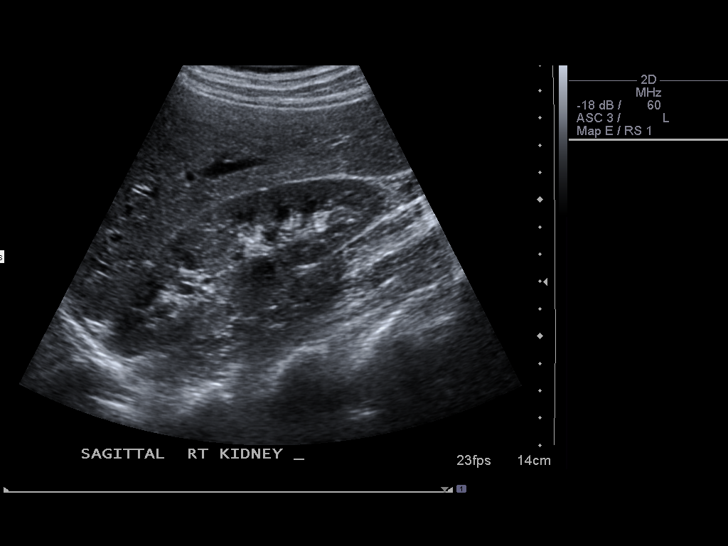
[im 59/94]
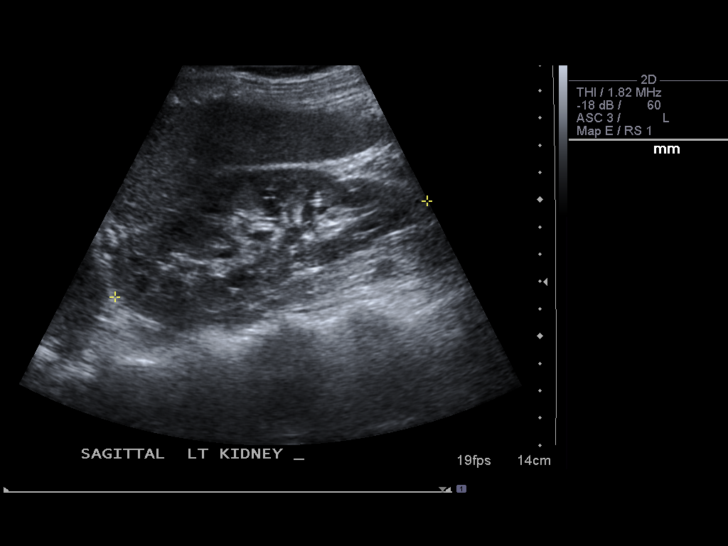
[im 63/94]
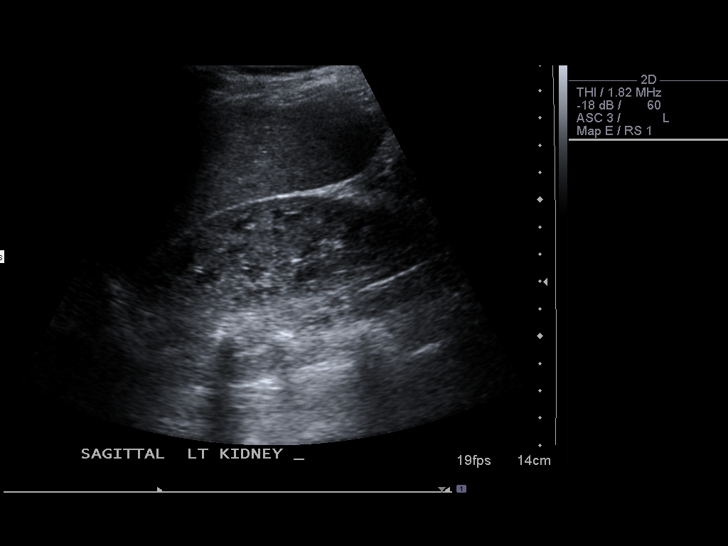
[im 70/94]
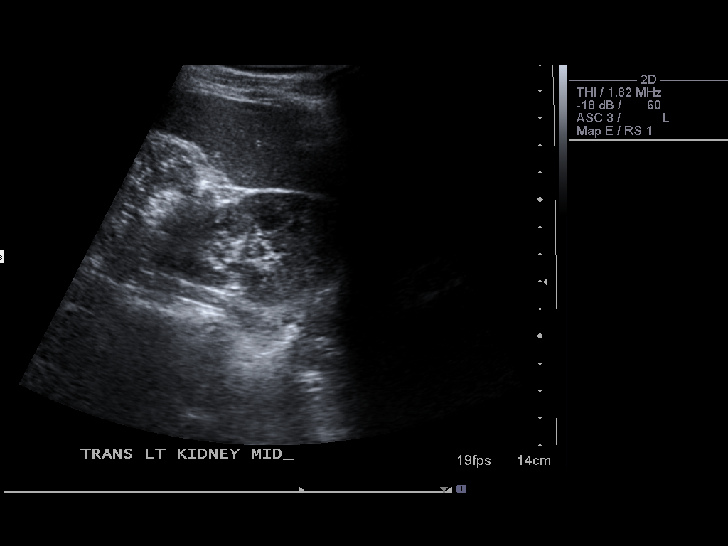
[im 78/94]
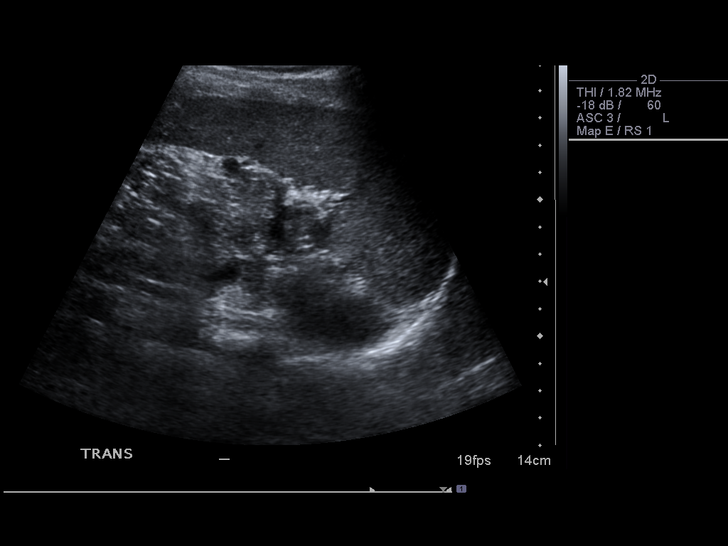
[im 86/94]
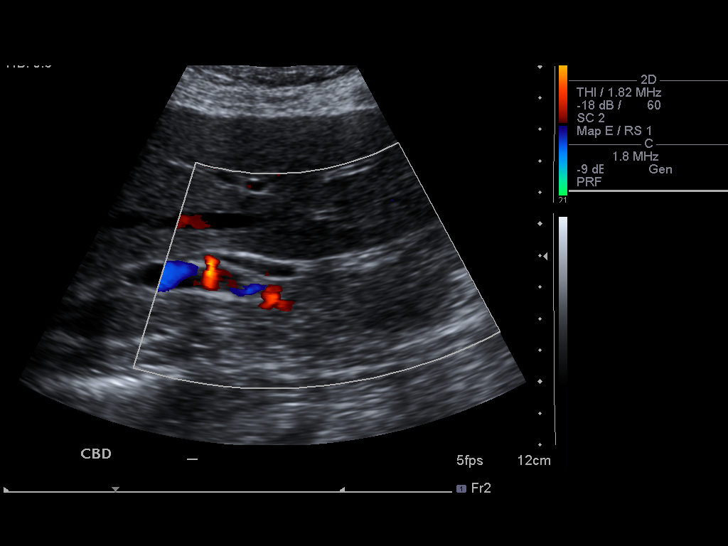
[im 94/94]
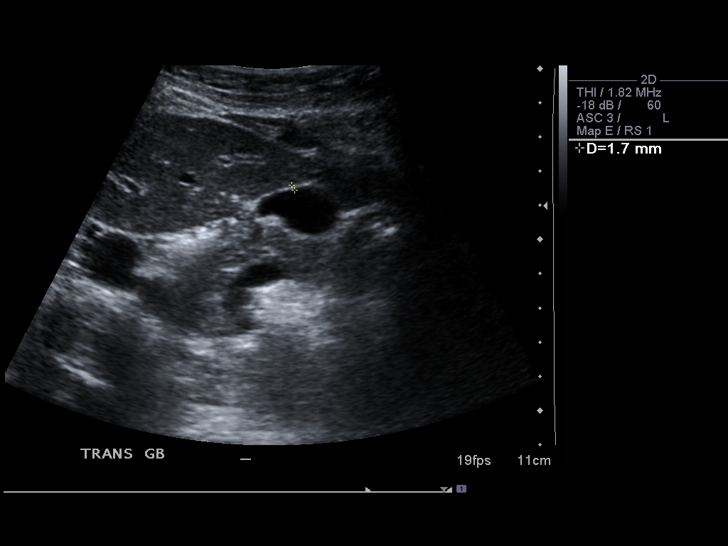

[14 of 25 positions shown; findings below may reference images not displayed]

FINDINGS: Gallbladder:  No gallstones, gallbladder wall thickening, or
pericholecystic fluid.

Common bile duct:  Normal at normal at 3.7 mm.

Liver:  Normal and parenchymal echogenicity.  No ductal dilatation.
There is a well-circumscribed hyperechoic lesion within the right
hepatic lobe which measures 1.6 x 1.5 cm.

IVC:  Appears normal.

Pancreas:  No focal abnormality seen.

Spleen:  Normal in size and echogenicity.

Right Kidney:  11.0cm in length.  No evidence of hydronephrosis or
stones.

Left Kidney:  12.0cm in length.  No evidence of hydronephrosis or
stones.

Abdominal aorta:  No aneurysm identified.
IMPRESSION: 1.  No acute abdominal process by  ultrasound.
2.  Echogenic lesion within the liver.  While this may represent
focal fat, recommend non emergent contrast MRI of the liver for
further evaluation to exclude neoplasm or infection.

## 2013-02-03 ENCOUNTER — Ambulatory Visit: Payer: BC Managed Care – PPO | Admitting: Women's Health

## 2013-02-06 ENCOUNTER — Encounter: Payer: Self-pay | Admitting: Women's Health

## 2013-02-06 ENCOUNTER — Ambulatory Visit (INDEPENDENT_AMBULATORY_CARE_PROVIDER_SITE_OTHER): Payer: BC Managed Care – PPO | Admitting: Women's Health

## 2013-02-06 DIAGNOSIS — N90810 Female genital mutilation status, unspecified: Secondary | ICD-10-CM

## 2013-02-06 DIAGNOSIS — N898 Other specified noninflammatory disorders of vagina: Secondary | ICD-10-CM

## 2013-02-06 DIAGNOSIS — N949 Unspecified condition associated with female genital organs and menstrual cycle: Secondary | ICD-10-CM

## 2013-02-06 DIAGNOSIS — L293 Anogenital pruritus, unspecified: Secondary | ICD-10-CM

## 2013-02-06 LAB — URINALYSIS W MICROSCOPIC + REFLEX CULTURE
Bilirubin Urine: NEGATIVE
Crystals: NONE SEEN
Glucose, UA: NEGATIVE mg/dL
Specific Gravity, Urine: <1.005 — ABNORMAL LOW (ref 1.005–1.030)
Urobilinogen, UA: 0.2 mg/dL (ref 0.0–1.0)

## 2013-02-06 LAB — WET PREP FOR TRICH, YEAST, CLUE: Clue Cells Wet Prep HPF POC: NONE SEEN

## 2013-02-06 MED ORDER — TERCONAZOLE 0.8 % VA CREA: 1.0000 | TOPICAL_CREAM | Freq: Every day | VAGINAL | Status: DC

## 2013-02-06 NOTE — Progress Notes (Signed)
Patient ID: Alisha Thomas, female   DOB: [birth year], 42 y.o.   MRN: [id number] Presents with vague complaints of low abdominal discomfort, occasional sharp vaginal pain, low back ache, vaginal discharge with itching. Rare sexual activity, monthly cycle. Denies pain or burning with urination. Had seen a urology for hematuria per Dr. Eda Paschal and had a negative CT scan but did not proceed with cystoscope. UA today shows small blood, 0 - 2 RBCs, 3-6 WBCs, few bacteria. Denies fever, nausea, change in bowel elimination. Reports home stressors, poor relationship with husband, denies physical abuse.  Exam: Appears well. No CVAT,  pain is more in the sacral area, abdomen soft, no rebound, radiation or pain with deep palpation. External genitalia, female circumcision, speculum exam cervix pink without lesion scant discharge, wet prep positive for yeast. Bimanual no CMT or adnexal fullness or tenderness with exam.  Yeast  Plan: Terazol 3 one applicator at bedtime x3, urine culture pending, instructed to call if pain persists, will check an ultrasound.

## 2013-02-06 NOTE — Patient Instructions (Addendum)
Monilial Vaginitis Vaginitis in a soreness, swelling and redness (inflammation) of the vagina and vulva. Monilial vaginitis is not a sexually transmitted infection. CAUSES  Yeast vaginitis is caused by yeast (candida) that is normally found in your vagina. With a yeast infection, the candida has overgrown in number to a point that upsets the chemical balance. SYMPTOMS   White, thick vaginal discharge.  Swelling, itching, redness and irritation of the vagina and possibly the lips of the vagina (vulva).  Burning or painful urination.  Painful intercourse. DIAGNOSIS  Things that may contribute to monilial vaginitis are:  Postmenopausal and virginal states.  Pregnancy.  Infections.  Being tired, sick or stressed, especially if you had monilial vaginitis in the past.  Diabetes. Good control will help lower the chance.  Birth control pills.  Tight fitting garments.  Using bubble bath, feminine sprays, douches or deodorant tampons.  Taking certain medications that kill germs (antibiotics).  Sporadic recurrence can occur if you become ill. TREATMENT  Your caregiver will give you medication.  There are several kinds of anti monilial vaginal creams and suppositories specific for monilial vaginitis. For recurrent yeast infections, use a suppository or cream in the vagina 2 times a week, or as directed.  Anti-monilial or steroid cream for the itching or irritation of the vulva may also be used. Get your caregiver's permission.  Painting the vagina with methylene blue solution may help if the monilial cream does not work.  Eating yogurt may help prevent monilial vaginitis. HOME CARE INSTRUCTIONS   Finish all medication as prescribed.  Do not have sex until treatment is completed or after your caregiver tells you it is okay.  Take warm sitz baths.  Do not douche.  Do not use tampons, especially scented ones.  Wear cotton underwear.  Avoid tight pants and panty  hose.  Tell your sexual partner that you have a yeast infection. They should go to their caregiver if they have symptoms such as mild rash or itching.  Your sexual partner should be treated as well if your infection is difficult to eliminate.  Practice safer sex. Use condoms.  Some vaginal medications cause latex condoms to fail. Vaginal medications that harm condoms are:  Cleocin cream.  Butoconazole (Femstat).  Terconazole (Terazol) vaginal suppository.  Miconazole (Monistat) (may be purchased over the counter). SEEK MEDICAL CARE IF:   You have a temperature by mouth above 102 F (38.9 C).  The infection is getting worse after 2 days of treatment.  The infection is not getting better after 3 days of treatment.  You develop blisters in or around your vagina.  You develop vaginal bleeding, and it is not your menstrual period.  You have pain when you urinate.  You develop intestinal problems.  You have pain with sexual intercourse. Document Released: 08/26/2005 Document Revised: 02/08/2012 Document Reviewed: 05/10/2009 Exit[employer] Patient Information 2013 [employer].  

## 2013-02-09 ENCOUNTER — Encounter: Payer: Self-pay | Admitting: Women's Health

## 2013-04-20 ENCOUNTER — Encounter: Payer: Self-pay | Admitting: Women's Health

## 2013-04-20 ENCOUNTER — Ambulatory Visit (INDEPENDENT_AMBULATORY_CARE_PROVIDER_SITE_OTHER): Payer: BC Managed Care – PPO | Admitting: Women's Health

## 2013-04-20 DIAGNOSIS — N899 Noninflammatory disorder of vagina, unspecified: Secondary | ICD-10-CM

## 2013-04-20 DIAGNOSIS — N898 Other specified noninflammatory disorders of vagina: Secondary | ICD-10-CM

## 2013-04-20 LAB — WET PREP FOR TRICH, YEAST, CLUE
Clue Cells Wet Prep HPF POC: NONE SEEN
Yeast Wet Prep HPF POC: NONE SEEN

## 2013-04-20 NOTE — Progress Notes (Signed)
Patient ID: Alisha Thomas, female   DOB: 05-Oct-1971, 42 y.o.   MRN: [id number] Presents with complaint of small bump on the right side of labia that was painful to touch, drained a small amount of  white drainage has now reduced in size but still has some external irritation. Denies any urinary symptoms, vaginal itching, or fever. History of female circumcision.  Exam: Right upper inner labia small erythematous spot, appears to be a papule that drained. Minimal erythema externally, speculum exam scant discharge, minimal erythema. Wet prep negative.  Resolved vaginal irritation  Plan loose clothes, instructed to call if persistent symptoms or problems.

## 2014-02-12 ENCOUNTER — Ambulatory Visit (INDEPENDENT_AMBULATORY_CARE_PROVIDER_SITE_OTHER): Payer: BC Managed Care – PPO | Admitting: Gynecology

## 2014-02-12 ENCOUNTER — Encounter: Payer: Self-pay | Admitting: Gynecology

## 2014-02-12 DIAGNOSIS — N899 Noninflammatory disorder of vagina, unspecified: Secondary | ICD-10-CM

## 2014-02-12 DIAGNOSIS — M549 Dorsalgia, unspecified: Secondary | ICD-10-CM

## 2014-02-12 DIAGNOSIS — N898 Other specified noninflammatory disorders of vagina: Secondary | ICD-10-CM

## 2014-02-12 LAB — URINALYSIS W MICROSCOPIC + REFLEX CULTURE
BILIRUBIN URINE: NEGATIVE
CRYSTALS: NONE SEEN
Casts: NONE SEEN
GLUCOSE, UA: NEGATIVE mg/dL
KETONES UR: NEGATIVE mg/dL
Leukocytes, UA: NEGATIVE
Nitrite: NEGATIVE
Protein, ur: NEGATIVE mg/dL
SPECIFIC GRAVITY, URINE: 1.015 (ref 1.005–1.030)
Urobilinogen, UA: 0.2 mg/dL (ref 0.0–1.0)
pH: 5.5 (ref 5.0–8.0)

## 2014-02-12 MED ORDER — FLUCONAZOLE 150 MG PO TABS: 150.0000 mg | ORAL_TABLET | Freq: Once | ORAL | Status: DC

## 2014-02-12 MED ORDER — CIPROFLOXACIN HCL 250 MG PO TABS
250.0000 mg | ORAL_TABLET | Freq: Two times a day (BID) | ORAL | Status: DC
Start: 2014-02-12 — End: 2014-03-07

## 2014-02-12 NOTE — Patient Instructions (Signed)
Take antibiotics twice daily for 7 days. Take Diflucan pill once. Repeat a clean-catch urinalysis in several weeks. Followup if her symptoms persist, worsen or recur.

## 2014-02-12 NOTE — Progress Notes (Signed)
Alisha Thomas 05/16/1971 [id number]        43 y.o.  [id number] presents with several weeks of vaginal irritation and low back pain. No real dysuria frequency urgency. Patient notes she gets low back pain whenever she is getting a UTI. No real discharge but more vaginal irritation.  Past medical history,surgical history, problem list, medications, allergies, family history and social history were all reviewed and documented in the EPIC chart.  Exam: Kim assistant General appearance  Normal Spine straight without CVA tenderness Abdomen soft nontender without masses guarding rebound Pelvic external BUS vagina with early menses flow. Cervix normal. Uterus normal size midline mobile nontender. Adnexa without masses or tenderness.  Assessment/Plan:  43 y.o. [id number] low back pain and vaginal irritation.  I did not do a wet prep as she's bleeding. Her urinalysis shows hematuria which I think is due to her vaginal bleeding. She does note that she thinks she is due for her menses now.  They show some bacteria and a few squamous cells. We'll cover her UTI with ciprofloxacin 250 mg twice a day x7 days. Diflucan 150 mg x1 dose given her irritation history and the recommendation to repeat a urinalysis regardless after her menses and antibiotics are over to make sure the hematuria clears. followup if symptoms persist, worsen or recur.   Note: This document was prepared with digital dictation and possible smart phrase technology. Any transcriptional errors that result from this process are unintentional.   Anastasio Auerbach MD, 11:35 AM 02/12/2014

## 2014-02-13 LAB — URINE CULTURE
Colony Count: NO GROWTH
Organism ID, Bacteria: NO GROWTH

## 2014-03-05 ENCOUNTER — Telehealth: Payer: Self-pay | Admitting: *Deleted

## 2014-03-05 DIAGNOSIS — M549 Dorsalgia, unspecified: Secondary | ICD-10-CM

## 2014-03-05 NOTE — Telephone Encounter (Signed)
I would go ahead and schedule an ultrasound with appointment for reexamination afterwards. Also to recheck a clean catch urinalysis at that time

## 2014-03-05 NOTE — Telephone Encounter (Signed)
Pt was seen on 02/12/14 OV c/o low back pain and vaginal irritation prescribed ciprofloxacin 250 mg twice a day x7 days, Diflucan 150 mg x1. Pt said back pain and pelvic pain has returned it comes and goes at times. Pt asked ultrasound should be done? Please advise

## 2014-03-05 NOTE — Telephone Encounter (Signed)
Pt informed with the below note. Front desk will call.

## 2014-03-07 ENCOUNTER — Encounter: Payer: Self-pay | Admitting: Gynecology

## 2014-03-07 ENCOUNTER — Ambulatory Visit (INDEPENDENT_AMBULATORY_CARE_PROVIDER_SITE_OTHER): Payer: BC Managed Care – PPO

## 2014-03-07 ENCOUNTER — Other Ambulatory Visit: Payer: Self-pay | Admitting: Gynecology

## 2014-03-07 ENCOUNTER — Ambulatory Visit (INDEPENDENT_AMBULATORY_CARE_PROVIDER_SITE_OTHER): Payer: BC Managed Care – PPO | Admitting: Gynecology

## 2014-03-07 DIAGNOSIS — N831 Corpus luteum cyst of ovary, unspecified side: Secondary | ICD-10-CM

## 2014-03-07 DIAGNOSIS — D251 Intramural leiomyoma of uterus: Secondary | ICD-10-CM

## 2014-03-07 DIAGNOSIS — M549 Dorsalgia, unspecified: Secondary | ICD-10-CM

## 2014-03-07 DIAGNOSIS — D259 Leiomyoma of uterus, unspecified: Secondary | ICD-10-CM

## 2014-03-07 LAB — URINALYSIS W MICROSCOPIC + REFLEX CULTURE
BILIRUBIN URINE: NEGATIVE
Casts: NONE SEEN
Crystals: NONE SEEN
Glucose, UA: NEGATIVE mg/dL
Ketones, ur: NEGATIVE mg/dL
NITRITE: NEGATIVE
PROTEIN: NEGATIVE mg/dL
UROBILINOGEN UA: 0.2 mg/dL (ref 0.0–1.0)
pH: 5.5 (ref 5.0–8.0)

## 2014-03-07 NOTE — Patient Instructions (Signed)
Office will contact you with urine culture results. Call the office if you do not hear within 2-3 days.

## 2014-03-07 NOTE — Progress Notes (Signed)
Alisha Thomas 10-01-1971 [id number]        43 y.o.  [id number] presents in followup with intermittent low back pain. Was evaluated 02/12/2014 and treated for a UTI with ciprofloxacin 250 mg 3 times a day x7 days and a Diflucan 150 mg 4 vaginal irritation. She notes that her blood back pain and some suprapubic discomfort has continued on and off since then. She called on the phone and I asked her to come back and schedule an ultrasound. And also to repeat her urinalysis as it did have blood in it before.  Past medical history,surgical history, problem list, medications, allergies, family history and social history were all reviewed and documented in the EPIC chart.  Exam: General appearance  Normal Spine straight without CVA tenderness Abdomen soft with minimal suprapubic tenderness. No guarding rebound masses.  Ultrasound shows uterus grossly normal in size with several small myomas, largest measuring 19 mm. Endometrial echo 10 mm. Right ovary with corpus luteal cyst. Left ovary normal. Cul-de-sac negative.  Assessment/Plan:  43 y.o. [id number] persistent low back pain/suprapubic discomfort. Other overt urinary symptoms such as frequency dysuria. No fever chills diarrhea constipation. Ultrasound shows several small myomas but otherwise normal. Repeat urinalysis today shows 7-10 rbc's few bacteria a few squamous cells. 3-6 WBC. Will await culture results. If positive we'll retreat with sensitivity directed antibiotics.  If negative culture then will refer to urology due to the persistent hematuria rule out interstitial cystitis or other pathology.   Note: This document was prepared with digital dictation and possible smart phrase technology. Any transcriptional errors that result from this process are unintentional.   Anastasio Auerbach MD, 11:26 AM 03/07/2014

## 2014-03-07 NOTE — Addendum Note (Signed)
Addended by: Nelva Nay on: 03/07/2014 12:26 PM   Modules accepted: Orders

## 2014-03-08 LAB — URINE CULTURE
Colony Count: NO GROWTH
ORGANISM ID, BACTERIA: NO GROWTH

## 2014-03-13 ENCOUNTER — Telehealth: Payer: Self-pay | Admitting: *Deleted

## 2014-03-13 NOTE — Telephone Encounter (Signed)
Appointment on 03/13/14 @ 9:45 am with Dr.Wrenn pt informed.

## 2014-03-13 NOTE — Telephone Encounter (Signed)
Message copied by Thamas Jaegers on Tue Mar 13, 2014 11:13 AM ------      Message from: Ramond Craver      Created: Mon Mar 12, 2014 10:54 AM      Regarding: urology referral       Tell patient urine culture was negative. Due to her symptoms and the persistent blood in her urine I want her to see a urologist. Arrange a urology appointment for her.            Patient has been informed. Knows she will hear from you.            Thanks!!!!! ------

## 2014-03-21 ENCOUNTER — Ambulatory Visit (INDEPENDENT_AMBULATORY_CARE_PROVIDER_SITE_OTHER): Payer: BC Managed Care – PPO | Admitting: Emergency Medicine

## 2014-03-21 VITALS — BP 106/76 | HR 74 | Temp 97.7°F | Resp 18 | Ht 65.0 in | Wt 123.4 lb

## 2014-03-21 DIAGNOSIS — Z0000 Encounter for general adult medical examination without abnormal findings: Secondary | ICD-10-CM

## 2014-03-21 DIAGNOSIS — Z111 Encounter for screening for respiratory tuberculosis: Secondary | ICD-10-CM

## 2014-03-21 NOTE — Progress Notes (Signed)
  Tuberculosis Risk Questionnaire  1. Yes  Were you born outside the [place] in one of the following parts of the world: [place], [place], [place], [place] or [place]?yes    2.  Have you traveled outside the [place] and lived for more than one month in one of the following parts of the world: [place], [place], [place], [place] or [place]?yes    3. No Do you have a compromised immune system such as from any of the following conditions:HIV/AIDS, organ or bone marrow transplantation, diabetes, immunosuppressive medicines (e.g. Prednisone, Remicaide), leukemia, lymphoma, cancer of the head or neck, gastrectomy or jejunal bypass, end-stage renal disease (on dialysis), or silicosis?no     4. No Have you ever or do you plan on working in: a residential care center, a health care facility, a jail or prison or homeless shelter?no    5. No Have you ever: injected illegal drugs, used crack cocaine, lived in a homeless shelter  or been in jail or prison? no    6. No Have you ever been exposed to anyone with infectious tuberculosis?no    Tuberculosis Symptom Questionnaire  Do you currently have any of the following symptoms?  1. No Unexplained cough lasting more than 3 weeks? no  2. No Unexplained fever lasting more than 3 weeks. no  3. No Night Sweats (sweating that leaves the bedclothes and sheets wet) no    4. No Shortness of Breath no  5. No Chest Pain no  6. No Unintentional weight loss no   7. No Unexplained fatigue (very tired for no reason) no

## 2014-03-21 NOTE — Progress Notes (Signed)
[facility] [address] [place] [address] [phone]  Date:  03/21/2014   Name:  Alisha Thomas   DOB:  February 03, 1971   MRN:  [id number]  PCP:   Melinda Crutch, MD    Chief Complaint: Employment Physical   History of Present Illness:  Alisha Thomas is a 43 y.o. very pleasant female patient who presents with the following:  Patient comes for examination for wellness purposes Denies other complaint or health concern today.   Patient Active Problem List   Diagnosis Date Noted  . 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 Peptic ulcer 09/10/2011  . Ovarian cyst   . Pelvic adhesions     Past Medical History  Diagnosis Date  . Ovarian cyst   . Pelvic adhesions   . Stomach ulcer     Past Surgical History  Procedure Laterality Date  . Ectopic pregnancy surgery    . Ovarian cyst surgery    . Pelvic laparoscopy  2008    Diag Lap lysis of adhesions    History  Substance Use Topics  . Smoking status: Never Smoker   . Smokeless tobacco: Never Used  . Alcohol Use: No    Family History  Problem Relation Age of Onset  . Hypertension Mother   . Breast cancer Paternal Aunt     Age 73  . Breast cancer Paternal Aunt     Age 51's    Allergies  Allergen Reactions  . Penicillins Rash    Medication list has been reviewed and updated.  Current Outpatient Prescriptions on File Prior to Visit  Medication Sig Dispense Refill  . Acetaminophen (TYLENOL 8 HOUR PO) Take by mouth.        . IBUPROFEN PO Take by mouth as needed.        . IRON PO Take by mouth.         No current facility-administered medications on file prior to visit.    Review of Systems:  As per HPI, otherwise negative.    Physical Examination: Filed Vitals:   03/21/14 1830  BP: 106/76  Pulse: 74  Temp: 97.7 F (36.5 C)  Resp: 18   Filed Vitals:   03/21/14 1830  Height: 5\' 5"  (1.651 m)  Weight: 123 lb 6.4 oz (55.974 kg)   Body mass index is 20.53 kg/(m^2). Ideal Body Weight:  Weight in (lb) to have BMI = 25: 149.9  .GEN: WDWN, NAD, Non-toxic, A & O x 3 HEENT: Atraumatic, Normocephalic. Neck supple. No masses, No LAD. Ears and Nose: No external deformity. CV: RRR, No M/G/R. No JVD. No thrill. No extra heart sounds. PULM: CTA B, no wheezes, crackles, rhonchi. No retractions. No resp. distress. No accessory muscle use. ABD: S, NT, ND, +BS. No rebound. No HSM. EXTR: No c/c/e NEURO Normal gait.  PSYCH: Normally interactive. Conversant. Not depressed or anxious appearing.  Calm demeanor.    Assessment and Plan: Wellness examination  Signed,  Ellison Carwin, MD

## 2014-03-24 ENCOUNTER — Ambulatory Visit (INDEPENDENT_AMBULATORY_CARE_PROVIDER_SITE_OTHER): Payer: BC Managed Care – PPO | Admitting: *Deleted

## 2014-03-24 DIAGNOSIS — Z111 Encounter for screening for respiratory tuberculosis: Secondary | ICD-10-CM

## 2014-03-24 LAB — TB SKIN TEST
Induration: 0 mm
TB SKIN TEST: NEGATIVE

## 2014-04-11 ENCOUNTER — Encounter: Payer: BC Managed Care – PPO | Admitting: Gynecology

## 2014-08-27 ENCOUNTER — Other Ambulatory Visit: Payer: Self-pay | Admitting: Family Medicine

## 2014-08-27 DIAGNOSIS — E010 Iodine-deficiency related diffuse (endemic) goiter: Secondary | ICD-10-CM

## 2014-09-04 ENCOUNTER — Ambulatory Visit
Admission: RE | Admit: 2014-09-04 | Discharge: 2014-09-04 | Disposition: A | Discharge status: Home / Self Care | Payer: BC Managed Care – PPO | Source: Ambulatory Visit | Attending: Family Medicine | Admitting: Family Medicine

## 2014-09-04 DIAGNOSIS — E010 Iodine-deficiency related diffuse (endemic) goiter: Secondary | ICD-10-CM

## 2015-03-27 ENCOUNTER — Ambulatory Visit (INDEPENDENT_AMBULATORY_CARE_PROVIDER_SITE_OTHER): Payer: BLUE CROSS/BLUE SHIELD | Admitting: Women's Health

## 2015-03-27 ENCOUNTER — Encounter: Payer: Self-pay | Admitting: Women's Health

## 2015-03-27 VITALS — BP 120/80 | Ht 65.0 in | Wt 126.0 lb

## 2015-03-27 DIAGNOSIS — N898 Other specified noninflammatory disorders of vagina: Secondary | ICD-10-CM

## 2015-03-27 LAB — WET PREP FOR TRICH, YEAST, CLUE
Clue Cells Wet Prep HPF POC: NONE SEEN
Trich, Wet Prep: NONE SEEN
WBC, Wet Prep HPF POC: NONE SEEN
Yeast Wet Prep HPF POC: NONE SEEN

## 2015-03-27 NOTE — Progress Notes (Signed)
Patient ID: Alisha Thomas, female   DOB: 08/29/1971, 44 y.o.   MRN: [id number] Presents with complaint of questionable vaginal cyst causing  Discomfort especially when sitting. Denies discharge, urinary symptoms, abdominal pain or fever. 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 Occasional dyspareunia.  Exam: Appears well. External genitalia female circumcision, no visible lesions, erythema or discharge noted, +1 cystocele. Discomfort at posterior  Fornix, no palpable cyst, probable scar tissue. Speculum exam scant discharge wet prep negative.  Reviewed normality of exam, reviewed area of discomfort most likely from scar tissue. Encouraged vaginal lubricants with intercourse. Overdue for annual exam, instructed to schedule.

## 2015-04-04 ENCOUNTER — Encounter: Payer: Self-pay | Admitting: Women's Health

## 2015-07-04 ENCOUNTER — Other Ambulatory Visit (HOSPITAL_COMMUNITY)
Admission: RE | Admit: 2015-07-04 | Discharge: 2015-07-04 | Disposition: A | Discharge status: Home / Self Care | Payer: BLUE CROSS/BLUE SHIELD | Source: Ambulatory Visit | Attending: Women's Health | Admitting: Women's Health

## 2015-07-04 ENCOUNTER — Ambulatory Visit (INDEPENDENT_AMBULATORY_CARE_PROVIDER_SITE_OTHER): Payer: BLUE CROSS/BLUE SHIELD | Admitting: Women's Health

## 2015-07-04 ENCOUNTER — Encounter: Payer: Self-pay | Admitting: Women's Health

## 2015-07-04 VITALS — BP 130/80 | Ht 66.0 in | Wt 127.0 lb

## 2015-07-04 DIAGNOSIS — Z01419 Encounter for gynecological examination (general) (routine) without abnormal findings: Secondary | ICD-10-CM | POA: Insufficient documentation

## 2015-07-04 DIAGNOSIS — Z1151 Encounter for screening for human papillomavirus (HPV): Secondary | ICD-10-CM | POA: Diagnosis present

## 2015-07-04 DIAGNOSIS — D219 Benign neoplasm of connective and other soft tissue, unspecified: Secondary | ICD-10-CM | POA: Insufficient documentation

## 2015-07-04 DIAGNOSIS — D259 Leiomyoma of uterus, unspecified: Secondary | ICD-10-CM | POA: Diagnosis not present

## 2015-07-04 DIAGNOSIS — N946 Dysmenorrhea, unspecified: Secondary | ICD-10-CM

## 2015-07-04 MED ORDER — IBUPROFEN 600 MG PO TABS: 600.0000 mg | ORAL_TABLET | Freq: Three times a day (TID) | ORAL | Status: DC | PRN

## 2015-07-04 NOTE — Addendum Note (Signed)
Addended by: Thurnell Garbe A on: 07/04/2015 11:49 AM   Modules accepted: Orders, SmartSet

## 2015-07-04 NOTE — Progress Notes (Signed)
Alisha Thomas [birth year] [id number]    History:    Presents for annual exam.  Monthly cycle/no contraception history of secondary infertility. Normal Pap and mammogram history. History of small fibroids 3 1-1/2 cm fibroids, dysmenorrhea. 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 History of a cystectomy and ectopic pregnancy. (Reports age as being 1, paperwork with green card age was wrong.)  Past medical history, past surgical history, family history and social history were all reviewed and documented in the EPIC chart. Substitute teacher. Daughter 2, son 7 both doing well.  ROS:  A ROS was performed and pertinent positives and negatives are included.  Exam:  Filed Vitals:   07/04/15 1011  BP: 130/80    General appearance:  Normal Thyroid:  Symmetrical, normal in size, without palpable masses or nodularity. Respiratory  Auscultation:  Clear without wheezing or rhonchi Cardiovascular  Auscultation:  Regular rate, without rubs, murmurs or gallops  Edema/varicosities:  Not grossly evident Abdominal  Soft,nontender, without masses, guarding or rebound.  Liver/spleen:  No organomegaly noted  Hernia:  None appreciated  Skin  Inspection:  Grossly normal   Breasts: Examined lying and sitting.     Right: Without masses, retractions, discharge or axillary adenopathy.     Left: Without masses, retractions, discharge or axillary adenopathy. Gentitourinary   Inguinal/mons:  Normal without inguinal adenopathy  External genitalia: Female circumcision  BUS/Urethra/Skene's glands:  Normal  Vagina:  Normal  Cervix:  Normal  Uterus:   normal in size, shape and contour.  Midline and mobile  Adnexa/parametria:     Rt: Without masses or tenderness.   Lt: Without masses or tenderness.  Anus and perineum: Normal  Digital rectal exam: Normal sphincter tone without palpated masses or tenderness  Assessment/Plan:  44 y.o. MBFG3 P2 for annual exam.    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 Contraception options reviewed and declined,  pregnancy okay. Encouraged lubricants with intercourse. UA, Pap with HR HPV typing, new screening guidelines reviewed. Pap normal 2013. SBE's, reviewed importance of annual screening mammogram, normal mammogram 2013. Breast center information given and reviewed instructed to schedule. Regular exercise, calcium rich diet, vitamin D 1000 daily encouraged.   Huel Cote [facility], 10:57 AM 07/04/2015

## 2015-07-04 NOTE — Patient Instructions (Signed)

## 2015-07-05 LAB — URINALYSIS W MICROSCOPIC + REFLEX CULTURE
BILIRUBIN URINE: NEGATIVE
Casts: NONE SEEN [LPF]
Crystals: NONE SEEN [HPF]
Glucose, UA: NEGATIVE
HGB URINE DIPSTICK: NEGATIVE
Ketones, ur: NEGATIVE
Leukocytes, UA: NEGATIVE
Nitrite: NEGATIVE
PH: 7.5 (ref 5.0–8.0)
Protein, ur: NEGATIVE
Specific Gravity, Urine: 1.01 (ref 1.001–1.035)
WBC, UA: NONE SEEN WBC/HPF (ref <=5)
Yeast: NONE SEEN [HPF]

## 2015-07-06 LAB — URINE CULTURE

## 2015-07-08 LAB — CYTOLOGY - PAP

## 2016-08-06 ENCOUNTER — Ambulatory Visit (INDEPENDENT_AMBULATORY_CARE_PROVIDER_SITE_OTHER): Payer: BLUE CROSS/BLUE SHIELD | Admitting: Women's Health

## 2016-08-06 ENCOUNTER — Encounter: Payer: Self-pay | Admitting: Women's Health

## 2016-08-06 VITALS — BP 124/80 | Ht 66.0 in | Wt 129.0 lb

## 2016-08-06 DIAGNOSIS — N3000 Acute cystitis without hematuria: Secondary | ICD-10-CM

## 2016-08-06 DIAGNOSIS — Z01419 Encounter for gynecological examination (general) (routine) without abnormal findings: Secondary | ICD-10-CM

## 2016-08-06 DIAGNOSIS — Z13220 Encounter for screening for lipoid disorders: Secondary | ICD-10-CM | POA: Diagnosis not present

## 2016-08-06 LAB — CBC WITH DIFFERENTIAL/PLATELET
BASOS ABS: 56 {cells}/uL (ref 0–200)
BASOS PCT: 1 %
Eosinophils Absolute: 112 cells/uL (ref 15–500)
Eosinophils Relative: 2 %
HCT: 35.2 % (ref 35.0–45.0)
HEMOGLOBIN: 11.3 g/dL — AB (ref 11.7–15.5)
LYMPHS ABS: 2128 {cells}/uL (ref 850–3900)
Lymphocytes Relative: 38 %
MCH: 24.9 pg — ABNORMAL LOW (ref 27.0–33.0)
MCHC: 32.1 g/dL (ref 32.0–36.0)
MCV: 77.7 fL — ABNORMAL LOW (ref 80.0–100.0)
MONO ABS: 448 {cells}/uL (ref 200–950)
MPV: 10.6 fL (ref 7.5–12.5)
Monocytes Relative: 8 %
Neutro Abs: 2856 cells/uL (ref 1500–7800)
Neutrophils Relative %: 51 %
Platelets: 356 10*3/uL (ref 140–400)
RBC: 4.53 MIL/uL (ref 3.80–5.10)
RDW: 16.3 % — ABNORMAL HIGH (ref 11.0–15.0)
WBC: 5.6 10*3/uL (ref 3.8–10.8)

## 2016-08-06 LAB — LIPID PANEL
CHOLESTEROL: 167 mg/dL (ref 125–200)
HDL: 83 mg/dL (ref >=46)
LDL Cholesterol: 71 mg/dL (ref <130)
Total CHOL/HDL Ratio: 2 Ratio (ref <=5.0)
Triglycerides: 67 mg/dL (ref <150)
VLDL: 13 mg/dL (ref <30)

## 2016-08-06 LAB — URINALYSIS W MICROSCOPIC + REFLEX CULTURE
Bilirubin Urine: NEGATIVE
Casts: NONE SEEN [LPF]
Crystals: NONE SEEN [HPF]
GLUCOSE, UA: NEGATIVE
Ketones, ur: NEGATIVE
NITRITE: NEGATIVE
Protein, ur: NEGATIVE
SPECIFIC GRAVITY, URINE: 1.01 (ref 1.001–1.035)
pH: 7 (ref 5.0–8.0)

## 2016-08-06 LAB — COMPREHENSIVE METABOLIC PANEL
ALBUMIN: 4.2 g/dL (ref 3.6–5.1)
ALK PHOS: 54 U/L (ref 33–115)
ALT: 8 U/L (ref 6–29)
AST: 13 U/L (ref 10–35)
BUN: 8 mg/dL (ref 7–25)
CO2: 22 mmol/L (ref 20–31)
Calcium: 9.5 mg/dL (ref 8.6–10.2)
Chloride: 104 mmol/L (ref 98–110)
Creat: 0.66 mg/dL (ref 0.50–1.10)
Glucose, Bld: 85 mg/dL (ref 65–99)
POTASSIUM: 4.4 mmol/L (ref 3.5–5.3)
Sodium: 136 mmol/L (ref 135–146)
TOTAL PROTEIN: 7.2 g/dL (ref 6.1–8.1)
Total Bilirubin: 0.3 mg/dL (ref 0.2–1.2)

## 2016-08-06 MED ORDER — SULFAMETHOXAZOLE-TRIMETHOPRIM 800-160 MG PO TABS: 1.0000 | ORAL_TABLET | Freq: Two times a day (BID) | ORAL | 0 refills | Status: DC

## 2016-08-06 MED ORDER — FLUCONAZOLE 150 MG PO TABS: 150.0000 mg | ORAL_TABLET | Freq: Once | ORAL | 1 refills | Status: AC

## 2016-08-06 NOTE — Progress Notes (Signed)
Alisha Thomas [birth year] [id number]    History:    Presents for annual exam.  Monthly cycle using no contraception history of infertility. Normal Pap history overdue for mammogram. History of 3 small fibroids mild cramping. Complains of frequency with pain and burning with urination and bladder pain without fever.   Past medical history, past surgical history, family history and social history were all reviewed and documented in the EPIC chart. Daughter 6, son 8 both doing well. Father died from asthma. Mother hypertension.  ROS:  A ROS was performed and pertinent positives and negatives are included.  Exam:  Vitals:   08/06/16 0905  BP: 124/80  Weight: 129 lb (58.5 kg)  Height: 5\' 6"  (1.676 m)   Body mass index is 20.82 kg/m.   General appearance:  Normal Thyroid:  Symmetrical, normal in size, without palpable masses or nodularity. Respiratory  Auscultation:  Clear without wheezing or rhonchi Cardiovascular  Auscultation:  Regular rate, without rubs, murmurs or gallops  Edema/varicosities:  Not grossly evident Abdominal  Soft,nontender, without masses, guarding or rebound.  Liver/spleen:  No organomegaly noted  Hernia:  None appreciated  Skin  Inspection:  Grossly normal   Breasts: Examined lying and sitting.     Right: Without masses, retractions, discharge or axillary adenopathy.     Left: Without masses, retractions, discharge or axillary adenopathy. Gentitourinary   Inguinal/mons:  Normal without inguinal adenopathy  External genitalia:  Female circumcision  BUS/Urethra/Skene's glands:  Normal  Vagina:  Normal  Cervix:  Normal  Uterus:   normal in size, shape and contour.  Midline and mobile  Adnexa/parametria:     Rt: Without masses or tenderness.   Lt: Without masses or tenderness.  Anus and perineum: Normal  Digital rectal exam: Normal sphincter tone without palpated masses or tenderness  Assessment/Plan:  45 y.o. MBF G4 P2  for annual exam with urinary  complaints  UA +1 leukocytes, 10-20 WBCs, moderate bacteria, few yeast  Monthly cycle/no contraception/infertility UTI Small fibroids with mild dysmenorrhea  Plan: Septra twice daily for 3 days prescription, proper use given and reviewed. Diflucan 150 by mouth times one dose with refill. Instructed to call if no relief of symptoms UTI prevention discussed. Urine culture pending. Contraception declines need. SBE's, encouraged to schedule 3-D mammogram has several aunts with breast cancer history (in [place]). Encouraged regular exercise, calcium rich diet, vitamin D 2000 IUs daily. Instructed to call if menstrual cramping persists. CBC, lipid panel, CMP, vitamin D, UA, Pap normal 2016, new screening guidelines reviewed.  Huel Cote [facility], 10:23 AM 08/06/2016

## 2016-08-06 NOTE — Patient Instructions (Signed)
[facility] [phone]  Health Maintenance, Female Adopting a healthy lifestyle and getting preventive care can go a long way to promote health and wellness. Talk with your health care provider about what schedule of regular examinations is right for you. This is a good chance for you to check in with your provider about disease prevention and staying healthy. In between checkups, there are plenty of things you can do on your own. Experts have done a lot of research about which lifestyle changes and preventive measures are most likely to keep you healthy. Ask your health care provider for more information. WEIGHT AND DIET  Eat a healthy diet  Be sure to include plenty of vegetables, fruits, low-fat dairy products, and lean protein.  Do not eat a lot of foods high in solid fats, added sugars, or salt.  Get regular exercise. This is one of the most important things you can do for your health.  Most adults should exercise for at least 150 minutes each week. The exercise should increase your heart rate and make you sweat (moderate-intensity exercise).  Most adults should also do strengthening exercises at least twice a week. This is in addition to the moderate-intensity exercise.  Maintain a healthy weight  Body mass index (BMI) is a measurement that can be used to identify possible weight problems. It estimates body fat based on height and weight. Your health care provider can help determine your BMI and help you achieve or maintain a healthy weight.  For females 20 years of age and older:   A BMI below 18.5 is considered underweight.  A BMI of 18.5 to 24.9 is normal.  A BMI of 25 to 29.9 is considered overweight.  A BMI of 30 and above is considered obese.  Watch levels of cholesterol and blood lipids  You should start having your blood tested for lipids and cholesterol at 47 years of age, then have this test every 5 years.  You may need to have your cholesterol levels checked  more often if:  Your lipid or cholesterol levels are high.  You are older than 47 years of age.  You are at high risk for heart disease.  CANCER SCREENING   Lung Cancer  Lung cancer screening is recommended for adults 55-80 years old who are at high risk for lung cancer because of a history of smoking.  A yearly low-dose CT scan of the lungs is recommended for people who:  Currently smoke.  Have quit within the past 15 years.  Have at least a 30-pack-year history of smoking. A pack year is smoking an average of one pack of cigarettes a day for 1 year.  Yearly screening should continue until it has been 15 years since you quit.  Yearly screening should stop if you develop a health problem that would prevent you from having lung cancer treatment.  Breast Cancer  Practice breast self-awareness. This means understanding how your breasts normally appear and feel.  It also means doing regular breast self-exams. Let your health care provider know about any changes, no matter how small.  If you are in your 20s or 30s, you should have a clinical breast exam (CBE) by a health care provider every 1-3 years as part of a regular health exam.  If you are 40 or older, have a CBE every year. Also consider having a breast X-ray (mammogram) every year.  If you have a family history of breast cancer, talk to your health care provider about genetic   screening.  If you are at high risk for breast cancer, talk to your health care provider about having an MRI and a mammogram every year.  Breast cancer gene (BRCA) assessment is recommended for women who have family members with BRCA-related cancers. BRCA-related cancers include:  Breast.  Ovarian.  Tubal.  Peritoneal cancers.  Results of the assessment will determine the need for genetic counseling and BRCA1 and BRCA2 testing. Cervical Cancer Your health care provider may recommend that you be screened regularly for cancer of the pelvic  organs (ovaries, uterus, and vagina). This screening involves a pelvic examination, including checking for microscopic changes to the surface of your cervix (Pap test). You may be encouraged to have this screening done every 3 years, beginning at age 21.  For women ages 30-65, health care providers may recommend pelvic exams and Pap testing every 3 years, or they may recommend the Pap and pelvic exam, combined with testing for human papilloma virus (HPV), every 5 years. Some types of HPV increase your risk of cervical cancer. Testing for HPV may also be done on women of any age with unclear Pap test results.  Other health care providers may not recommend any screening for nonpregnant women who are considered low risk for pelvic cancer and who do not have symptoms. Ask your health care provider if a screening pelvic exam is right for you.  If you have had past treatment for cervical cancer or a condition that could lead to cancer, you need Pap tests and screening for cancer for at least 20 years after your treatment. If Pap tests have been discontinued, your risk factors (such as having a new sexual partner) need to be reassessed to determine if screening should resume. Some women have medical problems that increase the chance of getting cervical cancer. In these cases, your health care provider may recommend more frequent screening and Pap tests. Colorectal Cancer  This type of cancer can be detected and often prevented.  Routine colorectal cancer screening usually begins at 47 years of age and continues through 47 years of age.  Your health care provider may recommend screening at an earlier age if you have risk factors for colon cancer.  Your health care provider may also recommend using home test kits to check for hidden blood in the stool.  A small camera at the end of a tube can be used to examine your colon directly (sigmoidoscopy or colonoscopy). This is done to check for the earliest forms  of colorectal cancer.  Routine screening usually begins at age 50.  Direct examination of the colon should be repeated every 5-10 years through 47 years of age. However, you may need to be screened more often if early forms of precancerous polyps or small growths are found. Skin Cancer  Check your skin from head to toe regularly.  Tell your health care provider about any new moles or changes in moles, especially if there is a change in a mole's shape or color.  Also tell your health care provider if you have a mole that is larger than the size of a pencil eraser.  Always use sunscreen. Apply sunscreen liberally and repeatedly throughout the day.  Protect yourself by wearing long sleeves, pants, a wide-brimmed hat, and sunglasses whenever you are outside. HEART DISEASE, DIABETES, AND HIGH BLOOD PRESSURE   High blood pressure causes heart disease and increases the risk of stroke. High blood pressure is more likely to develop in:  People who have blood pressure   in the high end of the normal range (130-139/85-89 mm Hg).  People who are overweight or obese.  People who are African American.  If you are 13-33 years of age, have your blood pressure checked every 3-5 years. If you are 45 years of age or older, have your blood pressure checked every year. You should have your blood pressure measured twice--once when you are at a hospital or clinic, and once when you are not at a hospital or clinic. Record the average of the two measurements. To check your blood pressure when you are not at a hospital or clinic, you can use:  An automated blood pressure machine at a pharmacy.  A home blood pressure monitor.  If you are between 23 years and 90 years old, ask your health care provider if you should take aspirin to prevent strokes.  Have regular diabetes screenings. This involves taking a blood sample to check your fasting blood sugar level.  If you are at a normal weight and have a low risk for  diabetes, have this test once every three years after 47 years of age.  If you are overweight and have a high risk for diabetes, consider being tested at a younger age or more often. PREVENTING INFECTION  Hepatitis B  If you have a higher risk for hepatitis B, you should be screened for this virus. You are considered at high risk for hepatitis B if:  You were born in a country where hepatitis B is common. Ask your health care provider which countries are considered high risk.  Your parents were born in a high-risk country, and you have not been immunized against hepatitis B (hepatitis B vaccine).  You have HIV or AIDS.  You use needles to inject street drugs.  You live with someone who has hepatitis B.  You have had sex with someone who has hepatitis B.  You get hemodialysis treatment.  You take certain medicines for conditions, including cancer, organ transplantation, and autoimmune conditions. Hepatitis C  Blood testing is recommended for:  Everyone born from [birth year] through 1967.  Anyone with known risk factors for hepatitis C. Sexually transmitted infections (STIs)  You should be screened for sexually transmitted infections (STIs) including gonorrhea and chlamydia if:  You are sexually active and are younger than 47 years of age.  You are older than 47 years of age and your health care provider tells you that you are at risk for this type of infection.  Your sexual activity has changed since you were last screened and you are at an increased risk for chlamydia or gonorrhea. Ask your health care provider if you are at risk.  If you do not have HIV, but are at risk, it may be recommended that you take a prescription medicine daily to prevent HIV infection. This is called pre-exposure prophylaxis (PrEP). You are considered at risk if:  You are sexually active and do not regularly use condoms or know the HIV status of your partner(s).  You take drugs by injection.  You are  sexually active with a partner who has HIV. Talk with your health care provider about whether you are at high risk of being infected with HIV. If you choose to begin PrEP, you should first be tested for HIV. You should then be tested every 3 months for as long as you are taking PrEP.  PREGNANCY   If you are premenopausal and you may become pregnant, ask your health care provider about preconception counseling.  If you may become pregnant, take 400 to 800 micrograms (mcg) of folic acid every day.  If you want to prevent pregnancy, talk to your health care provider about birth control (contraception). OSTEOPOROSIS AND MENOPAUSE   Osteoporosis is a disease in which the bones lose minerals and strength with aging. This can result in serious bone fractures. Your risk for osteoporosis can be identified using a bone density scan.  If you are 67 years of age or older, or if you are at risk for osteoporosis and fractures, ask your health care provider if you should be screened.  Ask your health care provider whether you should take a calcium or vitamin D supplement to lower your risk for osteoporosis.  Menopause may have certain physical symptoms and risks.  Hormone replacement therapy may reduce some of these symptoms and risks. Talk to your health care provider about whether hormone replacement therapy is right for you.  HOME CARE INSTRUCTIONS   Schedule regular health, dental, and eye exams.  Stay current with your immunizations.   Do not use any tobacco products including cigarettes, chewing tobacco, or electronic cigarettes.  If you are pregnant, do not drink alcohol.  If you are breastfeeding, limit how much and how often you drink alcohol.  Limit alcohol intake to no more than 1 drink per day for nonpregnant women. One drink equals 12 ounces of beer, 5 ounces of wine, or 1 ounces of hard liquor.  Do not use street drugs.  Do not share needles.  Ask your health care provider for  help if you need support or information about quitting drugs.  Tell your health care provider if you often feel depressed.  Tell your health care provider if you have ever been abused or do not feel safe at home.   This information is not intended to replace advice given to you by your health care provider. Make sure you discuss any questions you have with your health care provider.   Document Released: 06/01/2011 Document Revised: 12/07/2014 Document Reviewed: 10/18/2013 Elsevier Interactive Patient Education [employer].

## 2016-08-07 LAB — VITAMIN D 25 HYDROXY (VIT D DEFICIENCY, FRACTURES): VIT D 25 HYDROXY: 60 ng/mL (ref 30–100)

## 2016-08-07 LAB — URINE CULTURE: ORGANISM ID, BACTERIA: NO GROWTH

## 2016-08-10 ENCOUNTER — Other Ambulatory Visit: Payer: Self-pay | Admitting: Women's Health

## 2016-08-10 DIAGNOSIS — Z1231 Encounter for screening mammogram for malignant neoplasm of breast: Secondary | ICD-10-CM

## 2016-09-08 ENCOUNTER — Ambulatory Visit (INDEPENDENT_AMBULATORY_CARE_PROVIDER_SITE_OTHER): Payer: BLUE CROSS/BLUE SHIELD | Admitting: Women's Health

## 2016-09-08 ENCOUNTER — Other Ambulatory Visit: Payer: Self-pay | Admitting: Women's Health

## 2016-09-08 ENCOUNTER — Ambulatory Visit (INDEPENDENT_AMBULATORY_CARE_PROVIDER_SITE_OTHER): Payer: BLUE CROSS/BLUE SHIELD

## 2016-09-08 ENCOUNTER — Encounter: Payer: Self-pay | Admitting: Women's Health

## 2016-09-08 VITALS — BP 126/80 | Ht 66.0 in | Wt 129.0 lb

## 2016-09-08 DIAGNOSIS — N946 Dysmenorrhea, unspecified: Secondary | ICD-10-CM | POA: Diagnosis not present

## 2016-09-08 DIAGNOSIS — N839 Noninflammatory disorder of ovary, fallopian tube and broad ligament, unspecified: Secondary | ICD-10-CM

## 2016-09-08 DIAGNOSIS — D251 Intramural leiomyoma of uterus: Secondary | ICD-10-CM | POA: Diagnosis not present

## 2016-09-08 DIAGNOSIS — R9389 Abnormal findings on diagnostic imaging of other specified body structures: Secondary | ICD-10-CM

## 2016-09-08 DIAGNOSIS — N898 Other specified noninflammatory disorders of vagina: Secondary | ICD-10-CM

## 2016-09-08 DIAGNOSIS — N83201 Unspecified ovarian cyst, right side: Secondary | ICD-10-CM | POA: Diagnosis not present

## 2016-09-08 DIAGNOSIS — N838 Other noninflammatory disorders of ovary, fallopian tube and broad ligament: Secondary | ICD-10-CM

## 2016-09-08 DIAGNOSIS — B9689 Other specified bacterial agents as the cause of diseases classified elsewhere: Secondary | ICD-10-CM | POA: Diagnosis not present

## 2016-09-08 DIAGNOSIS — R1030 Lower abdominal pain, unspecified: Secondary | ICD-10-CM

## 2016-09-08 DIAGNOSIS — N760 Acute vaginitis: Secondary | ICD-10-CM

## 2016-09-08 DIAGNOSIS — R938 Abnormal findings on diagnostic imaging of other specified body structures: Secondary | ICD-10-CM | POA: Diagnosis not present

## 2016-09-08 DIAGNOSIS — R350 Frequency of micturition: Secondary | ICD-10-CM

## 2016-09-08 LAB — WET PREP FOR TRICH, YEAST, CLUE
TRICH WET PREP: NONE SEEN
YEAST WET PREP: NONE SEEN

## 2016-09-08 LAB — URINALYSIS W MICROSCOPIC + REFLEX CULTURE
Bilirubin Urine: NEGATIVE
CRYSTALS: NONE SEEN [HPF]
Casts: NONE SEEN [LPF]
GLUCOSE, UA: NEGATIVE
KETONES UR: NEGATIVE
NITRITE: NEGATIVE
PH: 5.5 (ref 5.0–8.0)
Protein, ur: NEGATIVE
Specific Gravity, Urine: 1.01 (ref 1.001–1.035)
Yeast: NONE SEEN [HPF]

## 2016-09-08 MED ORDER — METRONIDAZOLE 0.75 % VA GEL: VAGINAL | 0 refills | Status: DC

## 2016-09-08 MED ORDER — METRONIDAZOLE 500 MG PO TABS: 500.0000 mg | ORAL_TABLET | Freq: Two times a day (BID) | ORAL | 0 refills | Status: DC

## 2016-09-08 MED ORDER — IBUPROFEN 600 MG PO TABS
600.0000 mg | ORAL_TABLET | Freq: Three times a day (TID) | ORAL | 1 refills | Status: DC | PRN
Start: 2016-09-08 — End: 2018-09-04

## 2016-09-08 NOTE — Progress Notes (Signed)
Presents with complaint of low abdominal sharp intermittent pain, irritation with initiation of urination, denies pain at end of stream of urination. Was treated for UTI in September symptoms resolved for 1 week, urine culture was negative. Painful periods that have increased over the last few years. Does have a monthly cycle, history of infertility. Has occasional nausea none today. Denies vaginal discharge, fever, obstipation or change in elimination.  Exam: Appears uncomfortable. UA: Trace blood, +2 leukocytes, 40-60 WBCs, 3-10 RBCs, 20-40 squamous epithelials many bacteria, clue cells present. External genitalia, female circumcision, speculum exam scant discharge without odor noted, wet prep positive for few clues, many bacteria Ultrasound: T/V anteverted uterus with intramural fibroids 27 x 28 mm, 20 x 18 mm, 20 x 21 mm. Fluid in cul-de-sac 35 x 8 mm. Right ovary thick walled corpus luteal cysts with positive CFD periphery 25 x 20 x 13 mm. Left ovary rim of ovarian tissue with thin walled cystic mass 38 x 37 x 37 reticular echo pattern. Positive CFD to periphery arterial blood flow seen in ovary normal. Prominent endometrium.  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 Repeat ultrasound in 3 months after cycle. Reviewed possible fibroids causing dysmenorrhea, cysts functional. Reviewed fluid in the cul-de-sac possible ruptured cyst. Motrin 600 every 8 hours with menses.

## 2016-09-08 NOTE — Patient Instructions (Signed)
Bacterial Vaginosis Bacterial vaginosis is a vaginal infection that occurs when the normal balance of bacteria in the vagina is disrupted. It results from an overgrowth of certain bacteria. This is the most common vaginal infection in women of childbearing age. Treatment is important to prevent complications, especially in pregnant women, as it can cause a premature delivery. CAUSES  Bacterial vaginosis is caused by an increase in harmful bacteria that are normally present in smaller amounts in the vagina. Several different kinds of bacteria can cause bacterial vaginosis. However, the reason that the condition develops is not fully understood. RISK FACTORS Certain activities or behaviors can put you at an increased risk of developing bacterial vaginosis, including:  Having a new sex partner or multiple sex partners.  Douching.  Using an intrauterine device (IUD) for contraception. Women do not get bacterial vaginosis from toilet seats, bedding, swimming pools, or contact with objects around them. SIGNS AND SYMPTOMS  Some women with bacterial vaginosis have no signs or symptoms. Common symptoms include:  Grey vaginal discharge.  A fishlike odor with discharge, especially after sexual intercourse.  Itching or burning of the vagina and vulva.  Burning or pain with urination. DIAGNOSIS  Your health care provider will take a medical history and examine the vagina for signs of bacterial vaginosis. A sample of vaginal fluid may be taken. Your health care provider will look at this sample under a microscope to check for bacteria and abnormal cells. A vaginal pH test may also be done.  TREATMENT  Bacterial vaginosis may be treated with antibiotic medicines. These may be given in the form of a pill or a vaginal cream. A second round of antibiotics may be prescribed if the condition comes back after treatment. Because bacterial vaginosis increases your risk for sexually transmitted diseases, getting  treated can help reduce your risk for chlamydia, gonorrhea, HIV, and herpes. HOME CARE INSTRUCTIONS   Only take over-the-counter or prescription medicines as directed by your health care provider.  If antibiotic medicine was prescribed, take it as directed. Make sure you finish it even if you start to feel better.  Tell all sexual partners that you have a vaginal infection. They should see their health care provider and be treated if they have problems, such as a mild rash or itching.  During treatment, it is important that you follow these instructions:  Avoid sexual activity or use condoms correctly.  Do not douche.  Avoid alcohol as directed by your health care provider.  Avoid breastfeeding as directed by your health care provider. SEEK MEDICAL CARE IF:   Your symptoms are not improving after 3 days of treatment.  You have increased discharge or pain.  You have a fever. MAKE SURE YOU:   Understand these instructions.  Will watch your condition.  Will get help right away if you are not doing well or get worse. FOR MORE INFORMATION  Centers for Disease Control and Prevention, Division of STD Prevention: [employer] American Sexual Health Association (ASHA): www.ashastd.org    This information is not intended to replace advice given to you by your health care provider. Make sure you discuss any questions you have with your health care provider.   Document Released: 11/16/2005 Document Revised: 12/07/2014 Document Reviewed: 06/28/2013 Elsevier Interactive Patient Education 2016 [employer]. Ovarian Cyst An ovarian cyst is a fluid-filled sac that forms on an ovary. The ovaries are small organs that produce eggs in women. Various types of cysts can form on the ovaries. Most are not cancerous.  Many do not cause problems, and they often go away on their own. Some may cause symptoms and require treatment. Common types of ovarian cysts include:  Functional cysts--These  cysts may occur every month during the menstrual cycle. This is normal. The cysts usually go away with the next menstrual cycle if the woman does not get pregnant. Usually, there are no symptoms with a functional cyst.  Endometrioma cysts--These cysts form from the tissue that lines the uterus. They are also called "chocolate cysts" because they become filled with blood that turns brown. This type of cyst can cause pain in the lower abdomen during intercourse and with your menstrual period.  Cystadenoma cysts--This type develops from the cells on the outside of the ovary. These cysts can get very big and cause lower abdomen pain and pain with intercourse. This type of cyst can twist on itself, cut off its blood supply, and cause severe pain. It can also easily rupture and cause a lot of pain.  Dermoid cysts--This type of cyst is sometimes found in both ovaries. These cysts may contain different kinds of body tissue, such as skin, teeth, hair, or cartilage. They usually do not cause symptoms unless they get very big.  Theca lutein cysts--These cysts occur when too much of a certain hormone (human chorionic gonadotropin) is produced and overstimulates the ovaries to produce an egg. This is most common after procedures used to assist with the conception of a baby (in vitro fertilization). CAUSES   Fertility drugs can cause a condition in which multiple large cysts are formed on the ovaries. This is called ovarian hyperstimulation syndrome.  A condition called polycystic ovary syndrome can cause hormonal imbalances that can lead to nonfunctional ovarian cysts. SIGNS AND SYMPTOMS  Many ovarian cysts do not cause symptoms. If symptoms are present, they may include:  Pelvic pain or pressure.  Pain in the lower abdomen.  Pain during sexual intercourse.  Increasing girth (swelling) of the abdomen.  Abnormal menstrual periods.  Increasing pain with menstrual periods.  Stopping having menstrual  periods without being pregnant. DIAGNOSIS  These cysts are commonly found during a routine or annual pelvic exam. Tests may be ordered to find out more about the cyst. These tests may include:  Ultrasound.  X-ray of the pelvis.  CT scan.  MRI.  Blood tests. TREATMENT  Many ovarian cysts go away on their own without treatment. Your health care provider may want to check your cyst regularly for 2-3 months to see if it changes. For women in menopause, it is particularly important to monitor a cyst closely because of the higher rate of ovarian cancer in menopausal women. When treatment is needed, it may include any of the following:  A procedure to drain the cyst (aspiration). This may be done using a long needle and ultrasound. It can also be done through a laparoscopic procedure. This involves using a thin, lighted tube with a tiny camera on the end (laparoscope) inserted through a small incision.  Surgery to remove the whole cyst. This may be done using laparoscopic surgery or an open surgery involving a larger incision in the lower abdomen.  Hormone treatment or birth control pills. These methods are sometimes used to help dissolve a cyst. HOME CARE INSTRUCTIONS   Only take over-the-counter or prescription medicines as directed by your health care provider.  Follow up with your health care provider as directed.  Get regular pelvic exams and Pap tests. SEEK MEDICAL CARE IF:   Your periods  are late, irregular, or painful, or they stop.  Your pelvic pain or abdominal pain does not go away.  Your abdomen becomes larger or swollen.  You have pressure on your bladder or trouble emptying your bladder completely.  You have pain during sexual intercourse.  You have feelings of fullness, pressure, or discomfort in your stomach.  You lose weight for no apparent reason.  You feel generally ill.  You become constipated.  You lose your appetite.  You develop acne.  You have an  increase in body and facial hair.  You are gaining weight, without changing your exercise and eating habits.  You think you are pregnant. SEEK IMMEDIATE MEDICAL CARE IF:   You have increasing abdominal pain.  You feel sick to your stomach (nauseous), and you throw up (vomit).  You develop a fever that comes on suddenly.  You have abdominal pain during a bowel movement.  Your menstrual periods become heavier than usual. MAKE SURE YOU:  Understand these instructions.  Will watch your condition.  Will get help right away if you are not doing well or get worse.   This information is not intended to replace advice given to you by your health care provider. Make sure you discuss any questions you have with your health care provider.   Document Released: 11/16/2005 Document Revised: 11/21/2013 Document Reviewed: 07/24/2013 Elsevier Interactive Patient Education [employer].

## 2016-09-09 LAB — URINE CULTURE

## 2016-09-18 ENCOUNTER — Ambulatory Visit: Payer: BLUE CROSS/BLUE SHIELD

## 2018-01-09 ENCOUNTER — Encounter (HOSPITAL_COMMUNITY): Payer: Self-pay

## 2018-01-09 ENCOUNTER — Emergency Department (HOSPITAL_COMMUNITY): Payer: Medicaid Other

## 2018-01-09 ENCOUNTER — Emergency Department (HOSPITAL_COMMUNITY)
Admission: EM | Admit: 2018-01-09 | Discharge: 2018-01-09 | Disposition: A | Discharge status: Home / Self Care | Payer: Medicaid Other | Attending: Emergency Medicine | Admitting: Emergency Medicine

## 2018-01-09 ENCOUNTER — Other Ambulatory Visit: Payer: Self-pay

## 2018-01-09 DIAGNOSIS — Z79899 Other long term (current) drug therapy: Secondary | ICD-10-CM | POA: Insufficient documentation

## 2018-01-09 DIAGNOSIS — R05 Cough: Secondary | ICD-10-CM | POA: Diagnosis present

## 2018-01-09 DIAGNOSIS — R059 Cough, unspecified: Secondary | ICD-10-CM

## 2018-01-09 LAB — CBC WITH DIFFERENTIAL/PLATELET
BASOS ABS: 0.1 10*3/uL (ref 0.0–0.1)
Basophils Relative: 1 %
EOS PCT: 2 %
Eosinophils Absolute: 0.1 10*3/uL (ref 0.0–0.7)
HEMATOCRIT: 30.1 % — AB (ref 36.0–46.0)
Hemoglobin: 8.4 g/dL — ABNORMAL LOW (ref 12.0–15.0)
Lymphocytes Relative: 24 %
Lymphs Abs: 1.5 10*3/uL (ref 0.7–4.0)
MCH: 18.3 pg — ABNORMAL LOW (ref 26.0–34.0)
MCHC: 27.9 g/dL — ABNORMAL LOW (ref 30.0–36.0)
MCV: 65.7 fL — AB (ref 78.0–100.0)
MONOS PCT: 7 %
Monocytes Absolute: 0.4 10*3/uL (ref 0.1–1.0)
NEUTROS PCT: 66 %
Neutro Abs: 4.3 10*3/uL (ref 1.7–7.7)
Platelets: 487 10*3/uL — ABNORMAL HIGH (ref 150–400)
RBC: 4.58 MIL/uL (ref 3.87–5.11)
RDW: 18.3 % — ABNORMAL HIGH (ref 11.5–15.5)
WBC: 6.4 10*3/uL (ref 4.0–10.5)

## 2018-01-09 LAB — I-STAT TROPONIN, ED: Troponin i, poc: 0 ng/mL (ref 0.00–0.08)

## 2018-01-09 LAB — URINALYSIS, ROUTINE W REFLEX MICROSCOPIC
Bilirubin Urine: NEGATIVE
GLUCOSE, UA: NEGATIVE mg/dL
HGB URINE DIPSTICK: NEGATIVE
KETONES UR: NEGATIVE mg/dL
Leukocytes, UA: NEGATIVE
Nitrite: NEGATIVE
Protein, ur: NEGATIVE mg/dL
Specific Gravity, Urine: 1.008 (ref 1.005–1.030)
pH: 7 (ref 5.0–8.0)

## 2018-01-09 LAB — TSH: TSH: 2.476 u[IU]/mL (ref 0.350–4.500)

## 2018-01-09 LAB — COMPREHENSIVE METABOLIC PANEL
ALT: 22 U/L (ref 14–54)
AST: 24 U/L (ref 15–41)
Albumin: 3.6 g/dL (ref 3.5–5.0)
Alkaline Phosphatase: 56 U/L (ref 38–126)
Anion gap: 11 (ref 5–15)
BUN: 7 mg/dL (ref 6–20)
CHLORIDE: 103 mmol/L (ref 101–111)
CO2: 23 mmol/L (ref 22–32)
Calcium: 9.3 mg/dL (ref 8.9–10.3)
Creatinine, Ser: 0.63 mg/dL (ref 0.44–1.00)
Glucose, Bld: 96 mg/dL (ref 65–99)
POTASSIUM: 3.6 mmol/L (ref 3.5–5.1)
Sodium: 137 mmol/L (ref 135–145)
Total Bilirubin: 0.5 mg/dL (ref 0.3–1.2)
Total Protein: 6.9 g/dL (ref 6.5–8.1)

## 2018-01-09 LAB — I-STAT BETA HCG BLOOD, ED (MC, WL, AP ONLY)

## 2018-01-09 MED ORDER — BENZONATATE 100 MG PO CAPS: 100.0000 mg | ORAL_CAPSULE | Freq: Three times a day (TID) | ORAL | 0 refills | Status: DC

## 2018-01-09 MED ORDER — FERROUS SULFATE 325 (65 FE) MG PO TABS: 325.0000 mg | ORAL_TABLET | Freq: Every day | ORAL | 0 refills | Status: DC

## 2018-01-09 NOTE — ED Triage Notes (Signed)
Patient complains of cold symptoms with cough and URI symptoms intermittent since Thanksgiving. Has been taking otc meds with minimal relief. States she has a feeling that her heart is beating fast and unsure if related to meds. no Cp

## 2018-01-09 NOTE — ED Provider Notes (Signed)
[facility] EMERGENCY DEPARTMENT Provider Note   CSN: [id number] Arrival date & time: 01/09/18  4268     History   Chief Complaint No chief complaint on file.   HPI Alisha Thomas is a 47 y.o. female.  HPI   Alisha Thomas is a 47 y.o. female, with a history of ovarian cyst and pelvic adhesions, presenting to the ED with cough, congestion, fatigue, headache, and fever intermittent for the last three months. States symptoms will last for about 2 weeks, resolve for two weeks, then recur.  Also complains of intermittent feeling of heart racing. Has lost weight, states she has dropped from a size 8 to size 6 in last few months. Endorses brittle hair, brittle nails, and heat intolerance.   Denies shortness of breath, chest pain, abdominal pain, N/V/D, rash, neck pain/stiffness, neuro deficits, syncope, dizziness, or any other complaints.     Past Medical History:  Diagnosis Date  . Ovarian cyst   . Pelvic adhesions   . Stomach ulcer     Patient Active Problem List   Diagnosis Date Noted  . Fibroids 07/04/2015  . 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 Peptic ulcer 09/10/2011  . Ovarian cyst   . Pelvic adhesions     Past Surgical History:  Procedure Laterality Date  . ECTOPIC PREGNANCY SURGERY    . OVARIAN CYST SURGERY    . PELVIC LAPAROSCOPY  2008   Diag Lap lysis of adhesions    OB History    Gravida Para Term Preterm AB Living   4 2 2   2 2    SAB TAB Ectopic Multiple Live Births   2               Home Medications    Prior to Admission medications   Medication Sig Start Date End Date Taking? Authorizing Provider  Acetaminophen (TYLENOL 8 HOUR PO) Take by mouth.      [provider]  benzonatate (TESSALON) 100 MG capsule Take 1 capsule (100 mg total) by mouth every 8 (eight) hours. 01/09/18   Joy, Shawn C, PA-C  ferrous sulfate 325 (65 FE) MG tablet Take 1 tablet (325 mg total) by mouth daily. 01/09/18   Joy, Shawn C, PA-C  ibuprofen  (ADVIL,MOTRIN) 600 MG tablet Take 1 tablet (600 mg total) by mouth every 8 (eight) hours as needed. 09/08/16   Huel Cote, NP  IRON PO Take 1 tablet by mouth daily.     [provider]  metroNIDAZOLE (FLAGYL) 500 MG tablet Take 1 tablet (500 mg total) by mouth 2 (two) times daily. 09/08/16   Huel Cote, NP  metroNIDAZOLE (METROGEL VAGINAL) 0.75 % vaginal gel 1 applicator per vagina at HS x 5 09/08/16   Huel Cote, NP    Family History Family History  Problem Relation Age of Onset  . Hypertension Mother   . Breast cancer Paternal Aunt        Age 54  . Breast cancer Paternal Aunt        Age 17's    Social History Social History   Tobacco Use  . Smoking status: Never Smoker  . Smokeless tobacco: Never Used  Substance Use Topics  . Alcohol use: No    Alcohol/week: 0.0 oz  . Drug use: No     Allergies   Penicillins   Review of Systems Review of Systems  Constitutional: Positive for fatigue and fever.  HENT: Positive for congestion.   Eyes: Negative for visual  disturbance.  Respiratory: Positive for cough. Negative for shortness of breath.   Cardiovascular: Negative for chest pain.  Gastrointestinal: Negative for abdominal pain, diarrhea, nausea and vomiting.  Endocrine: Positive for heat intolerance.       Brittle hair and nails  Genitourinary: Negative for difficulty urinating.  Musculoskeletal: Negative for back pain, neck pain and neck stiffness.  Skin: Negative for rash.  Neurological: Positive for headaches. Negative for weakness and numbness.  All other systems reviewed and are negative.    Physical Exam Updated Vital Signs BP (!) 150/79 (BP Location: Right Arm)   Pulse 74   Temp 97.7 F (36.5 C) (Oral)   Resp 16   SpO2 98%   Physical Exam  Constitutional: She appears well-developed and well-nourished. No distress.  HENT:  Head: Normocephalic and atraumatic.  Eyes: Conjunctivae and EOM are normal. Pupils are equal, round, and  reactive to light.  Neck: Normal range of motion. Neck supple. No thyromegaly present.  Cardiovascular: Normal rate, regular rhythm, normal heart sounds and intact distal pulses.  Pulmonary/Chest: Effort normal and breath sounds normal. No respiratory distress.  Patient appears relaxed.  No increased work of breathing.  Speaks in full sentences without difficulty.  Abdominal: Soft. There is no tenderness. There is no guarding.  Musculoskeletal: She exhibits no edema.  Lymphadenopathy:    She has no cervical adenopathy.  Neurological: She is alert.  No sensory deficits.  No noted speech deficits. No aphasia. Patient handles oral secretions without difficulty. No noted swallowing defects.  Equal grip strength bilaterally. Strength 5/5 in the upper extremities. Strength 5/5 with flexion and extension of the hips, knees, and ankles bilaterally.  Patellar DTRs 2+ bilaterally. Negative Romberg. No gait disturbance.  Coordination intact including heel to shin and finger to nose.  Cranial nerves III-XII grossly intact.  No facial droop.   Skin: Skin is warm and dry. Capillary refill takes less than 2 seconds. She is not diaphoretic.  Psychiatric: She has a normal mood and affect. Her behavior is normal.  Nursing note and vitals reviewed.    ED Treatments / Results  Labs (all labs ordered are listed, but only abnormal results are displayed) Labs Reviewed  URINALYSIS, ROUTINE W REFLEX MICROSCOPIC - Abnormal; Notable for the following components:      Result Value   Color, Urine STRAW (*)    All other components within normal limits  CBC WITH DIFFERENTIAL/PLATELET - Abnormal; Notable for the following components:   Hemoglobin 8.4 (*)    HCT 30.1 (*)    MCV 65.7 (*)    MCH 18.3 (*)    MCHC 27.9 (*)    RDW 18.3 (*)    Platelets 487 (*)    All other components within normal limits  COMPREHENSIVE METABOLIC PANEL  TSH  I-STAT TROPONIN, ED  I-STAT BETA HCG BLOOD, ED ([facility], [facility], [facility] ONLY)    Hemoglobin  Date Value Ref Range Status  01/09/2018 8.4 (L) 12.0 - 15.0 g/dL Final  08/06/2016 11.3 (L) 11.7 - 15.5 g/dL Final  11/06/2010 10.2 (L) 12.0 - 15.0 g/dL Final  04/30/2008 11.6 (L)  Final    EKG  EKG Interpretation  Date/Time:  Sunday January 09 2018 08:45:00 EST Ventricular Rate:  75 PR Interval:  128 QRS Duration: 82 QT Interval:  368 QTC Calculation: 410 R Axis:   80 Text Interpretation:  Normal sinus rhythm Cannot rule out Anterior infarct , age undetermined Abnormal ECG No STEMI.  Confirmed by Nanda Quinton [phone]) on 01/09/2018 10:49:10  AM       Radiology Dg Chest 2 View  Result Date: 01/09/2018 CLINICAL DATA:  Cough. EXAM: CHEST  2 VIEW COMPARISON:  12/13/2014 FINDINGS: Normal heart size and mediastinal contours. No acute infiltrate or edema. No effusion or pneumothorax. No acute osseous findings. IMPRESSION: Negative chest. Electronically Signed   By: Monte Fantasia M.D.   On: 01/09/2018 09:07    Procedures Procedures (including critical care time)  Medications Ordered in ED Medications - No data to display   Initial Impression / Assessment and Plan / ED Course  I have reviewed the triage vital signs and the nursing notes.  Pertinent labs & imaging results that were available during my care of the patient were reviewed by me and considered in my medical decision making (see chart for details).  Clinical Course as of Jan 09 1054  Sun Jan 09, 2018  1042 Patient states she is supposed to be taking iron supplements, but stopped because they were making her constipated. She verifies she is not bleeding from anywhere.  Denies SOB, CP, or any other emergent symptoms.  Hemoglobin: (!) 8.4 [SJ]    Clinical Course User Index [SJ] Joy, Shawn C, PA-C    Patient presents with recurrent upper respiratory symptoms for the past few months.  Also notes some symptoms that give suspicion for thyroid dysfunction.  Doubt emergent conditions, such as thyroid storm  at this time. PERC negative.  Troponin negative.  Anemia noted, however, patient does not appear to be emergently symptomatic to this at this time.  Recommend PCP follow-up. The patient was given instructions for home care as well as return precautions. Patient voices understanding of these instructions, accepts the plan, and is comfortable with discharge.   Findings and plan of care discussed with Gara Kroner, MD.    Final Clinical Impressions(s) / ED Diagnoses   Final diagnoses:  Cough    ED Discharge Orders        Ordered    benzonatate (TESSALON) 100 MG capsule  Every 8 hours     01/09/18 1044    ferrous sulfate 325 (65 FE) MG tablet  Daily     01/09/18 1044       Lorayne Bender, PA-C 01/09/18 1057    Margette Fast, MD 01/10/18 [phone]

## 2018-01-09 NOTE — Discharge Instructions (Signed)
There were no acute abnormalities on the chest x-ray.  There was evidence of anemia on the blood work.  Your lab results were otherwise encouraging.  Hand washing: Wash your hands throughout the day, but especially before and after touching the face, using the restroom, sneezing, coughing, or touching surfaces that have been coughed or sneezed upon. Hydration: Symptoms will be intensified and complicated by dehydration. Dehydration can also extend the duration of symptoms. Drink plenty of fluids and get plenty of rest. You should be drinking at least half a liter of water an hour to stay hydrated. Electrolyte drinks (ex. Gatorade, Powerade, Pedialyte) are also encouraged. You should be drinking enough fluids to make your urine light yellow, almost clear. If this is not the case, you are not drinking enough water. Please note that some of the treatments indicated below will not be effective if you are not adequately hydrated. Pain or fever: Ibuprofen, Naproxen, or Tylenol for pain or fever.  Cough: Use the Tessalon for cough.  Zyrtec or Claritin: May add these medication daily to control underlying symptoms of congestion, sneezing, and other signs of allergies. Sleep: May use Benadryl or melatonin.  Use caution as these medications can make you drowsy. Flonase: Use this medication, as directed, for nasal and sinus congestion. Congestion: Plain Mucinex may help relieve congestion. Saline sinus rinses and saline nasal sprays may also help relieve congestion.  Sore throat: Warm liquids or Chloraseptic spray may help soothe a sore throat. Gargle twice a day with a salt water solution made from a half teaspoon of salt in a cup of warm water.  Follow up: Follow up with a primary care provider as soon as possible for your symptoms and on the anemia.

## 2018-02-24 DIAGNOSIS — D500 Iron deficiency anemia secondary to blood loss (chronic): Secondary | ICD-10-CM | POA: Insufficient documentation

## 2018-09-04 ENCOUNTER — Encounter (HOSPITAL_COMMUNITY): Payer: Self-pay

## 2018-09-04 ENCOUNTER — Ambulatory Visit (HOSPITAL_COMMUNITY)
Admission: EM | Admit: 2018-09-04 | Discharge: 2018-09-04 | Disposition: A | Discharge status: Home / Self Care | Payer: Medicaid Other | Attending: Internal Medicine | Admitting: Internal Medicine

## 2018-09-04 DIAGNOSIS — R69 Illness, unspecified: Secondary | ICD-10-CM | POA: Diagnosis not present

## 2018-09-04 DIAGNOSIS — J040 Acute laryngitis: Secondary | ICD-10-CM

## 2018-09-04 DIAGNOSIS — J111 Influenza due to unidentified influenza virus with other respiratory manifestations: Secondary | ICD-10-CM

## 2018-09-04 MED ORDER — BENZONATATE 100 MG PO CAPS: 200.0000 mg | ORAL_CAPSULE | Freq: Three times a day (TID) | ORAL | 0 refills | Status: DC | PRN

## 2018-09-04 MED ORDER — IBUPROFEN 800 MG PO TABS: 800.0000 mg | ORAL_TABLET | Freq: Three times a day (TID) | ORAL | 0 refills | Status: DC

## 2018-09-04 NOTE — Discharge Instructions (Signed)
Push fluids to ensure adequate hydration and keep secretions thin.  Tylenol and/or ibuprofen as needed for pain or fevers.  May continue with over the counter treatments as needed.  Tessalon as needed for cough.  Rest, rest voice as able. Throat lozenges, gargles, chloraseptic spray, warm teas, popsicles etc to help with throat pain.   If symptoms worsen or do not improve in the next week to return to be seen or to follow up with your PCP.

## 2018-09-04 NOTE — ED Triage Notes (Signed)
Pt presents with complaints of body aches, fever, loss of voice and fatigue since Thursday.

## 2018-09-04 NOTE — ED Provider Notes (Signed)
Alisha Thomas    CSN: [id number] Arrival date & time: 09/04/18  1049     History   Chief Complaint Chief Complaint  Patient presents with  . Influenza    HPI Alisha Thomas is a 47 y.o. female.   [name] presents with complaints of body aches, headache, cough, loss of voice, fevers which started three days ago. Cough is productive. Some congestion. Generalized head, neck, eye and ear pain. Has tried OTC cough treatments which have not helped. No gi/gu complaints. 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 No rash. Upper abdomen sore with coughing. No chest pain . Tylenol last at 0800 this morning. Hx of fibroids, ovarian cyst. No asthma, copd, DM or cardiac history.    ROS per HPI.      Past Medical History:  Diagnosis Date  . Ovarian cyst   . Pelvic adhesions   . Stomach ulcer     Patient Active Problem List   Diagnosis Date Noted  . Fibroids 07/04/2015  . 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 Peptic ulcer 09/10/2011  . Ovarian cyst   . Pelvic adhesions     Past Surgical History:  Procedure Laterality Date  . ECTOPIC PREGNANCY SURGERY    . OVARIAN CYST SURGERY    . PELVIC LAPAROSCOPY  2008   Diag Lap lysis of adhesions    OB History    Gravida  4   Para  2   Term  2   Preterm      AB  2   Living  2     SAB  2   TAB      Ectopic      Multiple      Live Births               Home Medications    Prior to Admission medications   Medication Sig Start Date End Date Taking? Authorizing Provider  Acetaminophen (TYLENOL 8 HOUR PO) Take by mouth.      [provider]  benzonatate (TESSALON) 100 MG capsule Take 2 capsules (200 mg total) by mouth 3 (three) times daily as needed for cough. 09/04/18   Augusto Gamble B, NP  ferrous sulfate 325 (65 FE) MG tablet Take 1 tablet (325 mg total) by mouth daily. 01/09/18   Joy, Shawn C, PA-C  ibuprofen (ADVIL,MOTRIN) 800 MG tablet Take 1 tablet (800 mg total) by mouth 3  (three) times daily. 09/04/18   Augusto Gamble B, NP  IRON PO Take 1 tablet by mouth daily.     [provider]    Family History Family History  Problem Relation Age of Onset  . Hypertension Mother   . Breast cancer Paternal Aunt        Age 43  . Breast cancer Paternal Aunt        Age 48's    Social History Social History   Tobacco Use  . Smoking status: Never Smoker  . Smokeless tobacco: Never Used  Substance Use Topics  . Alcohol use: No    Alcohol/week: 0.0 standard drinks  . Drug use: No     Allergies   Penicillins   Review of Systems Review of Systems   Physical Exam Triage Vital Signs ED Triage Vitals  Enc Vitals Group     BP 09/04/18 1133 130/79     Pulse Rate 09/04/18 1133 81     Resp 09/04/18 1133 18     Temp 09/04/18 1133 99.1 F (37.3  C)     Temp src --      SpO2 09/04/18 1133 100 %     Weight --      Height --      Head Circumference --      Peak Flow --      Pain Score 09/04/18 1131 8     Pain Loc --      Pain Edu? --      Excl. in [place]? --    No data found.  Updated Vital Signs BP 130/79   Pulse 81   Temp 99.1 F (37.3 C)   Resp 18   LMP 08/20/2018   SpO2 100%   Physical Exam  Constitutional: She is oriented to person, place, and time. She appears well-developed and well-nourished. No distress.  HENT:  Head: Normocephalic and atraumatic.  Right Ear: Tympanic membrane, external ear and ear canal normal.  Left Ear: Tympanic membrane, external ear and ear canal normal.  Nose: Nose normal.  Mouth/Throat: Uvula is midline, oropharynx is clear and moist and mucous membranes are normal. No tonsillar exudate.  Eyes: Pupils are equal, round, and reactive to light. Conjunctivae and EOM are normal.  Cardiovascular: Normal rate, regular rhythm and normal heart sounds.  Pulmonary/Chest: Effort normal and breath sounds normal.  Occasional strong cough noted; noted hoarse voice   Lymphadenopathy:    She has cervical adenopathy.    Neurological: She is alert and oriented to person, place, and time.  Skin: Skin is warm and dry.     UC Treatments / Results  Labs (all labs ordered are listed, but only abnormal results are displayed) Labs Reviewed - No data to display  EKG None  Radiology No results found.  Procedures Procedures (including critical care time)  Medications Ordered in UC Medications - No data to display  Initial Impression / Assessment and Plan / UC Course  I have reviewed the triage vital signs and the nursing notes.  Pertinent labs & imaging results that were available during my care of the patient were reviewed by me and considered in my medical decision making (see chart for details).     Non toxic, afebrile. History and physical consistent with viral illness.  Supportive cares recommended. Return precautions provided. If symptoms worsen or do not improve in the next week to return to be seen or to follow up with PCP.  Patient verbalized understanding and agreeable to plan.   Final Clinical Impressions(s) / UC Diagnoses   Final diagnoses:  Influenza-like illness  Laryngitis     Discharge Instructions     Push fluids to ensure adequate hydration and keep secretions thin.  Tylenol and/or ibuprofen as needed for pain or fevers.  May continue with over the counter treatments as needed.  Tessalon as needed for cough.  Rest, rest voice as able. Throat lozenges, gargles, chloraseptic spray, warm teas, popsicles etc to help with throat pain.   If symptoms worsen or do not improve in the next week to return to be seen or to follow up with your PCP.     ED Prescriptions    Medication Sig Dispense Auth. Provider   benzonatate (TESSALON) 100 MG capsule Take 2 capsules (200 mg total) by mouth 3 (three) times daily as needed for cough. 21 capsule Augusto Gamble B, NP   ibuprofen (ADVIL,MOTRIN) 800 MG tablet Take 1 tablet (800 mg total) by mouth 3 (three) times daily. 21 tablet Zigmund Gottron, NP     Controlled Substance  Prescriptions [place] Controlled Substance Registry consulted? Not Applicable   Zigmund Gottron, NP 09/04/18 1218

## 2018-09-06 ENCOUNTER — Encounter

## 2019-12-01 HISTORY — PX: BREAST CYST ASPIRATION: SHX578

## 2019-12-25 ENCOUNTER — Ambulatory Visit (INDEPENDENT_AMBULATORY_CARE_PROVIDER_SITE_OTHER): Payer: Medicaid Other | Admitting: Women's Health

## 2019-12-25 ENCOUNTER — Encounter: Payer: Self-pay | Admitting: Women's Health

## 2019-12-25 ENCOUNTER — Other Ambulatory Visit: Payer: Self-pay

## 2019-12-25 ENCOUNTER — Other Ambulatory Visit: Payer: Self-pay | Admitting: Women's Health

## 2019-12-25 VITALS — BP 124/80 | Ht 65.0 in | Wt 132.0 lb

## 2019-12-25 DIAGNOSIS — Z0000 Encounter for general adult medical examination without abnormal findings: Secondary | ICD-10-CM

## 2019-12-25 DIAGNOSIS — Z01419 Encounter for gynecological examination (general) (routine) without abnormal findings: Secondary | ICD-10-CM

## 2019-12-25 DIAGNOSIS — N841 Polyp of cervix uteri: Secondary | ICD-10-CM

## 2019-12-25 DIAGNOSIS — Z1231 Encounter for screening mammogram for malignant neoplasm of breast: Secondary | ICD-10-CM

## 2019-12-25 NOTE — Patient Instructions (Addendum)
Breast center [phone] Vit D 2000 iu daily Colonoscopy  [phone]  [facility]  Vag lubrication, replens or Roberts Maintenance, Female Adopting a healthy lifestyle and getting preventive care are important in promoting health and wellness. Ask your health care provider about:  The right schedule for you to have regular tests and exams.  Things you can do on your own to prevent diseases and keep yourself healthy. What should I know about diet, weight, and exercise? Eat a healthy diet   Eat a diet that includes plenty of vegetables, fruits, low-fat dairy products, and lean protein.  Do not eat a lot of foods that are high in solid fats, added sugars, or sodium. Maintain a healthy weight Body mass index (BMI) is used to identify weight problems. It estimates body fat based on height and weight. Your health care provider can help determine your BMI and help you achieve or maintain a healthy weight. Get regular exercise Get regular exercise. This is one of the most important things you can do for your health. Most adults should:  Exercise for at least 150 minutes each week. The exercise should increase your heart rate and make you sweat (moderate-intensity exercise).  Do strengthening exercises at least twice a week. This is in addition to the moderate-intensity exercise.  Spend less time sitting. Even light physical activity can be beneficial. Watch cholesterol and blood lipids Have your blood tested for lipids and cholesterol at 51 years of age, then have this test every 5 years. Have your cholesterol levels checked more often if:  Your lipid or cholesterol levels are high.  You are older than 51 years of age.  You are at high risk for heart disease. What should I know about cancer screening? Depending on your health history and family history, you may need to have cancer screening at various ages. This may include screening for:  Breast cancer.  Cervical cancer.  Colorectal  cancer.  Skin cancer.  Lung cancer. What should I know about heart disease, diabetes, and high blood pressure? Blood pressure and heart disease  High blood pressure causes heart disease and increases the risk of stroke. This is more likely to develop in people who have high blood pressure readings, are of African descent, or are overweight.  Have your blood pressure checked: ? Every 3-5 years if you are 101-30 years of age. ? Every year if you are 100 years old or older. Diabetes Have regular diabetes screenings. This checks your fasting blood sugar level. Have the screening done:  Once every three years after age 76 if you are at a normal weight and have a low risk for diabetes.  More often and at a younger age if you are overweight or have a high risk for diabetes. What should I know about preventing infection? Hepatitis B If you have a higher risk for hepatitis B, you should be screened for this virus. Talk with your health care provider to find out if you are at risk for hepatitis B infection. Hepatitis C Testing is recommended for:  Everyone born from [birth year] through 1967.  Anyone with known risk factors for hepatitis C. Sexually transmitted infections (STIs)  Get screened for STIs, including gonorrhea and chlamydia, if: ? You are sexually active and are younger than 51 years of age. ? You are older than 51 years of age and your health care provider tells you that you are at risk for this type of infection. ? Your sexual activity has changed since  you were last screened, and you are at increased risk for chlamydia or gonorrhea. Ask your health care provider if you are at risk.  Ask your health care provider about whether you are at high risk for HIV. Your health care provider may recommend a prescription medicine to help prevent HIV infection. If you choose to take medicine to prevent HIV, you should first get tested for HIV. You should then be tested every 3 months for as long as  you are taking the medicine. Pregnancy  If you are about to stop having your period (premenopausal) and you may become pregnant, seek counseling before you get pregnant.  Take 400 to 800 micrograms (mcg) of folic acid every day if you become pregnant.  Ask for birth control (contraception) if you want to prevent pregnancy. Osteoporosis and menopause Osteoporosis is a disease in which the bones lose minerals and strength with aging. This can result in bone fractures. If you are 47 years old or older, or if you are at risk for osteoporosis and fractures, ask your health care provider if you should:  Be screened for bone loss.  Take a calcium or vitamin D supplement to lower your risk of fractures.  Be given hormone replacement therapy (HRT) to treat symptoms of menopause. Follow these instructions at home: Lifestyle  Do not use any products that contain nicotine or tobacco, such as cigarettes, e-cigarettes, and chewing tobacco. If you need help quitting, ask your health care provider.  Do not use street drugs.  Do not share needles.  Ask your health care provider for help if you need support or information about quitting drugs. Alcohol use  Do not drink alcohol if: ? Your health care provider tells you not to drink. ? You are pregnant, may be pregnant, or are planning to become pregnant.  If you drink alcohol: ? Limit how much you use to 0-1 drink a day. ? Limit intake if you are breastfeeding.  Be aware of how much alcohol is in your drink. In the [place] one drink equals one 12 oz bottle of beer (355 mL), one 5 oz glass of wine (148 mL), or one 1 oz glass of hard liquor (44 mL). General instructions  Schedule regular health, dental, and eye exams.  Stay current with your vaccines.  Tell your health care provider if: ? You often feel depressed. ? You have ever been abused or do not feel safe at home. Summary  Adopting a healthy lifestyle and getting preventive care are  important in promoting health and wellness.  Follow your health care provider's instructions about healthy diet, exercising, and getting tested or screened for diseases.  Follow your health care provider's instructions on monitoring your cholesterol and blood pressure. This information is not intended to replace advice given to you by your health care provider. Make sure you discuss any questions you have with your health care provider. Document Revised: 11/09/2018 Document Reviewed: 11/09/2018 Elsevier Patient Education  2020 [employer].

## 2019-12-25 NOTE — Progress Notes (Addendum)
Alisha Thomas 10/03/1971 [id number]    History:    Presents for annual exam.  Regular monthly cycle.  Reports painful intercourse, remarried, second partner for both after several years of abstinence.  Reports great relationship.  Normal Pap and mammogram history overdue for mammogram.  History of 2 ectopics one with surgical removal of tube and ovary other resolved  with methotrexate.  Past medical history, past surgical history, family history and social history were all reviewed and documented in the EPIC chart.Works for the school system part-time.  Daughter 40 at [facility] doing well son 40.  Father deceased asthma, mother hypertension.  ROS:  A ROS was performed and pertinent positives and negatives are included.  Exam:  Vitals:   12/25/19 1427  BP: 124/80  Weight: 132 lb (59.9 kg)  Height: 5\' 5"  (1.651 m)   Body mass index is 21.97 kg/m.   General appearance:  Normal Thyroid:  Symmetrical, normal in size, without palpable masses or nodularity. Respiratory  Auscultation:  Clear without wheezing or rhonchi Cardiovascular  Auscultation:  Regular rate, without rubs, murmurs or gallops  Edema/varicosities:  Not grossly evident Abdominal  Soft,nontender, without masses, guarding or rebound.  Liver/spleen:  No organomegaly noted  Hernia:  None appreciated  Skin  Inspection:  Grossly normal   Breasts: Examined lying and sitting.     Right: Without masses, retractions, discharge or axillary adenopathy.     Left: Without masses, retractions, discharge or axillary adenopathy. 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  Midline and mobile  Adnexa/parametria:     Rt: Without masses or tenderness.   Lt: Without masses or tenderness.  Anus and  perineum: Normal  Digital rectal exam: Normal sphincter tone without palpated masses or tenderness  Assessment/Plan:  49 y.o. MBF G4, P2 for annual exam with dyspareunia.  Regular monthly cycle ,no spotting or menopausal symptoms Vaginismus Probable 2 cm cervical polyp sent for biopsy  Plan: Reviewed importance of self-care, leisure activities, relaxation.  Encouraged vaginal lubricants, able to relax vaginal muscles with some assistance, instructed to call if continued problems.   SBEs, annual screening mammogram, calcium rich foods, vitamin D 2000 IUs daily encouraged.  Overdue for mammogram instructed to schedule.  Screening colonoscopy [facility] information given.  Reports normal labs at primary care health screening this year.  Pap with HR HPV typing.  Screening guidelines reviewed.[place], 3:14 PM 12/25/2019

## 2019-12-26 LAB — PAP, TP IMAGING W/ HPV RNA, RFLX HPV TYPE 16,18/45: HPV DNA High Risk: NOT DETECTED

## 2020-01-27 ENCOUNTER — Ambulatory Visit: Payer: Medicaid Other

## 2020-01-30 ENCOUNTER — Other Ambulatory Visit: Payer: Self-pay

## 2020-01-30 ENCOUNTER — Ambulatory Visit
Admission: RE | Admit: 2020-01-30 | Discharge: 2020-01-30 | Disposition: A | Discharge status: Home / Self Care | Payer: Medicaid Other | Source: Ambulatory Visit | Attending: Women's Health | Admitting: Women's Health

## 2020-01-30 DIAGNOSIS — Z1231 Encounter for screening mammogram for malignant neoplasm of breast: Secondary | ICD-10-CM

## 2020-02-05 ENCOUNTER — Other Ambulatory Visit: Payer: Self-pay | Admitting: Women's Health

## 2020-02-05 DIAGNOSIS — R928 Other abnormal and inconclusive findings on diagnostic imaging of breast: Secondary | ICD-10-CM

## 2020-02-09 ENCOUNTER — Telehealth: Payer: Self-pay

## 2020-02-09 NOTE — Telephone Encounter (Signed)
Patient called because she received a letter from [facility] saying she needed additional imaging. She was concerned what to do.  I explained that they saw an area that the radiologist wanted to look closer at and recommended breast u/s. I told her she could call and schedule the appointment since there is an order in the system or I would be happy to call for her. She took the [facility] phone number and said she would call for the appointment.

## 2020-02-13 ENCOUNTER — Other Ambulatory Visit: Payer: Self-pay

## 2020-02-13 ENCOUNTER — Ambulatory Visit
Admission: RE | Admit: 2020-02-13 | Discharge: 2020-02-13 | Disposition: A | Discharge status: Home / Self Care | Payer: Medicaid Other | Source: Ambulatory Visit | Attending: Women's Health | Admitting: Women's Health

## 2020-02-13 DIAGNOSIS — R928 Other abnormal and inconclusive findings on diagnostic imaging of breast: Secondary | ICD-10-CM

## 2020-02-20 ENCOUNTER — Telehealth: Payer: Self-pay | Admitting: *Deleted

## 2020-02-20 ENCOUNTER — Other Ambulatory Visit: Payer: Self-pay | Admitting: Women's Health

## 2020-02-20 DIAGNOSIS — N6002 Solitary cyst of left breast: Secondary | ICD-10-CM

## 2020-02-20 NOTE — Telephone Encounter (Signed)
Patient informed, would like to schedule , I called breast center and scheduled on 02/29/20 @ 2:45pm.  Patient informed.

## 2020-02-20 NOTE — Telephone Encounter (Signed)
Patient had diag mammogram and left breast ultrasound last week. She called to get your recommendations regarding breast cyst, patient said she was told fluid in left breast which can be drained however she will need order for this. Patient said she does not feel comfortable having "fluid" in her left breast and it hurts when laying on this side. She would like your input on this as well. Please advise

## 2020-02-20 NOTE — Telephone Encounter (Signed)
Please call and review the area in question does appear to be a 3 cm benign-appearing cyst fluid-filled and will most likely decrease the discomfort once it is drained./will collapse it.    It is not solid which is a good sign that it will resolve when drained.  Please let her know that I think it would be safe and a good idea to have it drained since causing discomfort.  Let me know if she has any further questions

## 2020-02-26 ENCOUNTER — Telehealth: Payer: Self-pay | Admitting: *Deleted

## 2020-02-26 NOTE — Telephone Encounter (Addendum)
Breast center called stating the ultrasound only needs prior authorization for appointment on 02/29/20. I called [employer] [phone]  PA approved with authorization # [id number]  And case/approval # [id number] effective 02/26/20-08/24/20. CPT [id number]  Dx code:N 60.0

## 2020-02-27 ENCOUNTER — Other Ambulatory Visit: Payer: Medicaid Other

## 2020-02-29 ENCOUNTER — Ambulatory Visit
Admission: RE | Admit: 2020-02-29 | Discharge: 2020-02-29 | Disposition: A | Discharge status: Home / Self Care | Payer: Medicaid Other | Source: Ambulatory Visit | Attending: Women's Health | Admitting: Women's Health

## 2020-02-29 ENCOUNTER — Other Ambulatory Visit: Payer: Self-pay

## 2020-02-29 DIAGNOSIS — N6002 Solitary cyst of left breast: Secondary | ICD-10-CM

## 2020-03-26 DIAGNOSIS — D649 Anemia, unspecified: Secondary | ICD-10-CM | POA: Insufficient documentation

## 2020-12-27 ENCOUNTER — Encounter (HOSPITAL_COMMUNITY): Payer: Self-pay

## 2020-12-27 ENCOUNTER — Ambulatory Visit (INDEPENDENT_AMBULATORY_CARE_PROVIDER_SITE_OTHER): Payer: Medicaid Other

## 2020-12-27 ENCOUNTER — Other Ambulatory Visit: Payer: Self-pay

## 2020-12-27 ENCOUNTER — Ambulatory Visit (HOSPITAL_COMMUNITY)
Admission: EM | Admit: 2020-12-27 | Discharge: 2020-12-27 | Disposition: A | Discharge status: Home / Self Care | Payer: Medicaid Other | Attending: Medical Oncology | Admitting: Medical Oncology

## 2020-12-27 DIAGNOSIS — W19XXXA Unspecified fall, initial encounter: Secondary | ICD-10-CM | POA: Diagnosis not present

## 2020-12-27 DIAGNOSIS — S82891A Other fracture of right lower leg, initial encounter for closed fracture: Secondary | ICD-10-CM | POA: Diagnosis present

## 2020-12-27 DIAGNOSIS — S92001G Unspecified fracture of right calcaneus, subsequent encounter for fracture with delayed healing: Secondary | ICD-10-CM | POA: Insufficient documentation

## 2020-12-27 DIAGNOSIS — M549 Dorsalgia, unspecified: Secondary | ICD-10-CM

## 2020-12-27 DIAGNOSIS — M25571 Pain in right ankle and joints of right foot: Secondary | ICD-10-CM | POA: Diagnosis not present

## 2020-12-27 DIAGNOSIS — R109 Unspecified abdominal pain: Secondary | ICD-10-CM | POA: Diagnosis present

## 2020-12-27 DIAGNOSIS — Z3202 Encounter for pregnancy test, result negative: Secondary | ICD-10-CM

## 2020-12-27 LAB — POCT URINALYSIS DIPSTICK, ED / UC
Bilirubin Urine: NEGATIVE
Glucose, UA: NEGATIVE mg/dL
Ketones, ur: NEGATIVE mg/dL
Leukocytes,Ua: NEGATIVE
Nitrite: NEGATIVE
Protein, ur: NEGATIVE mg/dL
Specific Gravity, Urine: 1.01 (ref 1.005–1.030)
Urobilinogen, UA: 0.2 mg/dL (ref 0.0–1.0)
pH: 5.5 (ref 5.0–8.0)

## 2020-12-27 LAB — POC URINE PREG, ED: Preg Test, Ur: NEGATIVE

## 2020-12-27 MED ORDER — HYDROCODONE-ACETAMINOPHEN 5-325 MG PO TABS: 1.0000 | ORAL_TABLET | Freq: Four times a day (QID) | ORAL | 0 refills | Status: AC | PRN

## 2020-12-27 NOTE — Discharge Instructions (Addendum)
Please call [facility] on Monday to schedule a follow up next week.

## 2020-12-27 NOTE — Progress Notes (Signed)
Orthopedic Tech Progress Note Patient Details:  Alisha Thomas 02/09/1971 [id number]  Ortho Devices Type of Ortho Device: Post (short leg) splint,Stirrup splint Ortho Device/Splint Location: rle Ortho Device/Splint Interventions: Ordered,Application,Adjustment   Post Interventions Patient Tolerated: Well Instructions Provided: Care of device,Poper ambulation with device   Mearl Olver 12/27/2020, 8:35 PM

## 2020-12-27 NOTE — ED Triage Notes (Addendum)
Pt in with c/o back and abdominal pain that has been going on for the last few days. States that the pain was so bad she lost her balance today and twisted her right ankle  Pt has applied ice for relief  +2 pedal pulse, edema noted to right ankle

## 2020-12-27 NOTE — ED Provider Notes (Signed)
[facility]    CSN: [id number] Arrival date & time: 12/27/20  1658      History   Chief Complaint Chief Complaint  Patient presents with  . Back Pain  . Abdominal Pain  . Ankle Pain    HPI Alisha Thomas is a 50 y.o. female.   HPI  Back/ABD Pain: Pt reports that she has has back pain for the past few days. She also has had associated right flank pain.  She reports that today during a bout of back pain she lost her balance and twisted her ankle coming down the stairs. No other injury. Pain rated 7 out of 10 in nature.  She states that the pain is mainly constant but does worsen at times without known cause.  She has had some urinary frequency and urgency but denies any significant dysuria or any vaginal discharge fever or abdominal pain.  In terms of her ankle she states that it is so uncomfortable that she is not able to ambulate on the extremity.  It is swollen from her fall.  No previous injury.  She has not tried anything for her pain.   Past Medical History:  Diagnosis Date  . Ovarian cyst   . Pelvic adhesions   . Stomach ulcer     Patient Active Problem List   Diagnosis Date Noted  . Fibroids 07/04/2015  . 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 Peptic ulcer 09/10/2011  . Ovarian cyst   . Pelvic adhesions     Past Surgical History:  Procedure Laterality Date  . ECTOPIC PREGNANCY SURGERY    . OVARIAN CYST SURGERY    . PELVIC LAPAROSCOPY  2008   Diag Lap lysis of adhesions    OB History    Gravida  4   Para  2   Term  2   Preterm      AB  2   Living  2     SAB  2   IAB      Ectopic      Multiple      Live Births               Home Medications    Prior to Admission medications   Medication Sig Start Date End Date Taking? Authorizing Provider  Ascorbic Acid (VITA-C PO) Take by mouth.    [provider]  ibuprofen (ADVIL,MOTRIN) 800 MG tablet Take 1 tablet (800 mg total) by mouth 3 (three) times daily. 09/04/18   Augusto Gamble B, NP  IRON PO Take 1 tablet by mouth daily.     [provider]  VITAMIN D PO Take by mouth.    [provider]    Family History Family History  Problem Relation Age of Onset  . Hypertension Mother   . Breast cancer Paternal Aunt        Age 85  . Breast cancer Paternal Aunt        Age 16's    Social History Social History   Tobacco Use  . Smoking status: Never Smoker  . Smokeless tobacco: Never Used  Substance Use Topics  . Alcohol use: No    Alcohol/week: 0.0 standard drinks  . Drug use: No     Allergies   Penicillins   Review of Systems Review of Systems   Physical Exam Triage Vital Signs ED Triage Vitals  Enc Vitals Group     BP 12/27/20 1708 (!) 163/109     Pulse Rate 12/27/20  1708 92     Resp 12/27/20 1708 17     Temp 12/27/20 1711 98.4 F (36.9 C)     Temp src --      SpO2 12/27/20 1708 99 %     Weight --      Height --      Head Circumference --      Peak Flow --      Pain Score 12/27/20 1709 7     Pain Loc --      Pain Edu? --      Excl. in [place]? --    No data found.  Updated Vital Signs BP (!) 163/109   Pulse 92   Temp 98.4 F (36.9 C)   Resp 17   LMP 11/09/2020 (Exact Date)   SpO2 99%   Physical Exam Vitals and nursing note reviewed.  Constitutional:      General: She is not in acute distress.    Appearance: She is not ill-appearing, toxic-appearing or diaphoretic.  HENT:     Head: Normocephalic and atraumatic.  Cardiovascular:     Rate and Rhythm: Normal rate and regular rhythm.     Heart sounds: Normal heart sounds.  Pulmonary:     Effort: Pulmonary effort is normal.     Breath sounds: Normal breath sounds.  Abdominal:     General: Abdomen is flat. Bowel sounds are normal. There is no distension.     Palpations: Abdomen is soft. There is no mass.     Tenderness: There is no abdominal tenderness. There is no right CVA tenderness, left CVA tenderness, guarding or rebound. Negative signs include  Murphy's sign and McBurney's sign.     Hernia: No hernia is present.  Musculoskeletal:       Feet:  Skin:    Findings: No rash.  Neurological:     Mental Status: She is alert.      UC Treatments / Results  Labs (all labs ordered are listed, but only abnormal results are displayed) Labs Reviewed  POCT URINALYSIS DIPSTICK, ED / UC - Abnormal; Notable for the following components:      Result Value   Hgb urine dipstick TRACE (*)    All other components within normal limits  URINE CULTURE  POC URINE PREG, ED   Radiology No results found.  Procedures Procedures (including critical care time)  Medications Ordered in UC Medications - No data to display  Initial Impression / Assessment and Plan / UC Course  I have reviewed the triage vital signs and the nursing notes.  Pertinent labs & imaging results that were available during my care of the patient were reviewed by me and considered in my medical decision making (see chart for details).     New.  In terms of her flank pain this appears to be a kidney stone that is not obstructive at this time. Urine culture is pending given frequency and urgency. I have discussed this with patient.  Starting her on Flomax and I am going to have her follow-up with her PCP in just a few days.  She will stay hydrated with water.  We discussed precautions that would encourage her to go to the emergency room for further evaluation.  In terms of her ankle pain her x-ray shows both an avulsion fracture of her anterior navicular and lateral calcaneus.  We discussed this in detail.  She will be splinted appropriately and be given crutches.  It has been discussed with patient that she  needs to follow-up with the on-call orthopedic provider on Monday to schedule an appointment for follow-up next week.  I will be giving her a small limited supply of Norco to help with her pain.  We discussed how to use this medication along with common potential side effects,  precautions, blackbox warnings.  She is aware not to drive on this medication, she should avoid alcohol and benzodiazepines.  Given the location of her symptoms she will need a driver and a work note. Final Clinical Impressions(s) / UC Diagnoses   Final diagnoses:  Flank pain  Acute right ankle pain   Discharge Instructions   None    ED Prescriptions    None     PDMP not reviewed this encounter.   Hughie Closs, [place] 12/27/20 1934

## 2020-12-27 NOTE — ED Notes (Signed)
Called ortho tech for lower leg splint placement.

## 2020-12-29 LAB — URINE CULTURE: Culture: NO GROWTH

## 2021-02-26 ENCOUNTER — Emergency Department (HOSPITAL_BASED_OUTPATIENT_CLINIC_OR_DEPARTMENT_OTHER)
Admission: EM | Admit: 2021-02-26 | Discharge: 2021-02-26 | Disposition: A | Discharge status: Home / Self Care | Payer: Medicaid Other | Attending: Emergency Medicine | Admitting: Emergency Medicine

## 2021-02-26 ENCOUNTER — Encounter (HOSPITAL_BASED_OUTPATIENT_CLINIC_OR_DEPARTMENT_OTHER): Payer: Self-pay | Admitting: Emergency Medicine

## 2021-02-26 ENCOUNTER — Other Ambulatory Visit: Payer: Self-pay

## 2021-02-26 DIAGNOSIS — R21 Rash and other nonspecific skin eruption: Secondary | ICD-10-CM | POA: Diagnosis present

## 2021-02-26 MED ORDER — TRIAMCINOLONE ACETONIDE 0.5 % EX OINT: 1.0000 "application " | TOPICAL_OINTMENT | Freq: Two times a day (BID) | CUTANEOUS | 0 refills | Status: DC

## 2021-02-26 NOTE — ED Triage Notes (Signed)
  Patient comes in with rash to upper and lower back.  Patient states it has been going on for about a month.  Patient was using OTC anti itch cream with no relief.  Saw PCP and received a prescription for another cream but isnt sure what its called.  Patient has some skin peeling on lower back, and some redness on upper back.  No pain, just itching badly.

## 2021-02-26 NOTE — Discharge Instructions (Addendum)
Call your primary care doctor or specialist as discussed in the next 2-3 days.   Return immediately back to the ER if:  Your symptoms worsen within the next 12-24 hours. You develop new symptoms such as new fevers, persistent vomiting, new pain, shortness of breath, or new weakness or numbness, or if you have any other concerns.  

## 2021-02-26 NOTE — ED Provider Notes (Signed)
[facility] EM[facility] DEPT Provider Note   CSN: [id number] Arrival date & time: 02/26/21  1912     History Chief Complaint  Patient presents with  . Rash    Alisha Thomas is a 50 y.o. female.  Patient presents chief complaint of rash to the upper and lower back.  She states she had a for about a month.  She seen her primary care doctor who prescribed a steroid cream but she feels like is not working well.  She is tried Atarax but has continued itching.  She states she could not sleep last night due to increased itching so presented to the ER today.  She denies any fevers or cough or chills.  No vomiting or diarrhea.        Past Medical History:  Diagnosis Date  . Ovarian cyst   . Pelvic adhesions   . Stomach ulcer     Patient Active Problem List   Diagnosis Date Noted  . Fibroids 07/04/2015  . 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 Peptic ulcer 09/10/2011  . Ovarian cyst   . Pelvic adhesions     Past Surgical History:  Procedure Laterality Date  . ECTOPIC PREGNANCY SURGERY    . OVARIAN CYST SURGERY    . PELVIC LAPAROSCOPY  2008   Diag Lap lysis of adhesions     OB History    Gravida  4   Para  2   Term  2   Preterm      AB  2   Living  2     SAB  2   IAB      Ectopic      Multiple      Live Births              Family History  Problem Relation Age of Onset  . Hypertension Mother   . Breast cancer Paternal Aunt        Age 73  . Breast cancer Paternal Aunt        Age 55's    Social History   Tobacco Use  . Smoking status: Never Smoker  . Smokeless tobacco: Never Used  Substance Use Topics  . Alcohol use: No    Alcohol/week: 0.0 standard drinks  . Drug use: No    Home Medications Prior to Admission medications   Medication Sig Start Date End Date Taking? Authorizing Provider  triamcinolone ointment (KENALOG) 0.5 % Apply 1 application topically 2 (two) times daily. To upper and lower back affected regions. 02/26/21   Yes Luna Fuse, MD  Ascorbic Acid (VITA-C PO) Take by mouth.    [provider]  IRON PO Take 1 tablet by mouth daily.     [provider]  VITAMIN D PO Take by mouth.    [provider]    Allergies    Penicillins  Review of Systems   Review of Systems  Constitutional: Negative for fever.  HENT: Negative for ear pain.   Eyes: Negative for pain.  Respiratory: Negative for cough.   Cardiovascular: Negative for chest pain.  Gastrointestinal: Negative for abdominal pain.  Genitourinary: Negative for flank pain.  Musculoskeletal: Negative for back pain.  Skin: Positive for rash.  Neurological: Negative for headaches.    Physical Exam Updated Vital Signs BP 131/72 (BP Location: Right Arm)   Pulse 80   Temp 98.8 F (37.1 C) (Oral)   Resp 18   Ht 5\' 5"  (1.651 m)   Wt 78 kg  SpO2 100%   BMI 28.62 kg/m   Physical Exam Constitutional:      General: She is not in acute distress.    Appearance: Normal appearance.  HENT:     Head: Normocephalic.     Nose: Nose normal.  Eyes:     Extraocular Movements: Extraocular movements intact.  Cardiovascular:     Rate and Rhythm: Normal rate.  Pulmonary:     Effort: Pulmonary effort is normal.  Musculoskeletal:        General: Normal range of motion.     Cervical back: Normal range of motion.  Skin:    Comments: 2 x 3 cm flat discoloration in the right side upper mid back seen.  No abnormal warmth to touch.  No tenderness to touch.  Of lower back along L3-4 paraspinal region she has an area approximately 10 x 6 cm region of indurated skin and itchiness.  Nontender to palpation no blistering or vesicles noted.  No ulceration noted.  No cellulitis noted.  Neurological:     General: No focal deficit present.     Mental Status: She is alert. Mental status is at baseline.     ED Results / Procedures / Treatments   Labs (all labs ordered are listed, but only abnormal results are displayed) Labs Reviewed  - No data to display  EKG None  Radiology No results found.  Procedures Procedures   Medications Ordered in ED Medications - No data to display  ED Course  I have reviewed the triage vital signs and the nursing notes.  Pertinent labs & imaging results that were available during my care of the patient were reviewed by me and considered in my medical decision making (see chart for details).    MDM Rules/Calculators/A&P                          Patient presents with persistent rash for 1 month.  No evidence of cellulitis or abscess on exam.  New soaps or new detergents or new linens.  Will advise outpatient follow-up with dermatology within the week.  Advising immediate return for fevers or worsening symptoms.  Final Clinical Impression(s) / ED Diagnoses Final diagnoses:  Rash    Rx / DC Orders ED Discharge Orders         Ordered    triamcinolone ointment (KENALOG) 0.5 %  2 times daily        02/26/21 1955           Luna Fuse, MD 02/26/21 [phone]

## 2021-03-19 ENCOUNTER — Other Ambulatory Visit: Payer: Self-pay | Admitting: Physician Assistant

## 2021-03-19 DIAGNOSIS — Z1231 Encounter for screening mammogram for malignant neoplasm of breast: Secondary | ICD-10-CM

## 2021-05-07 ENCOUNTER — Other Ambulatory Visit: Payer: Self-pay

## 2021-05-07 ENCOUNTER — Ambulatory Visit
Admission: RE | Admit: 2021-05-07 | Discharge: 2021-05-07 | Disposition: A | Discharge status: Home / Self Care | Payer: Medicaid Other | Source: Ambulatory Visit | Attending: Physician Assistant | Admitting: Physician Assistant

## 2021-05-07 DIAGNOSIS — Z1231 Encounter for screening mammogram for malignant neoplasm of breast: Secondary | ICD-10-CM

## 2021-10-09 ENCOUNTER — Encounter: Payer: Self-pay | Admitting: Nurse Practitioner

## 2021-10-09 ENCOUNTER — Ambulatory Visit (INDEPENDENT_AMBULATORY_CARE_PROVIDER_SITE_OTHER): Payer: Medicaid Other | Admitting: Nurse Practitioner

## 2021-10-09 ENCOUNTER — Other Ambulatory Visit: Payer: Self-pay

## 2021-10-09 VITALS — BP 116/76

## 2021-10-09 DIAGNOSIS — N939 Abnormal uterine and vaginal bleeding, unspecified: Secondary | ICD-10-CM

## 2021-10-09 NOTE — Progress Notes (Signed)
   Acute Office Visit  Subjective:    Patient ID: Alisha Thomas, female    DOB: 14-Feb-1971, 50 y.o.   MRN: [id number]   HPI 50 y.o. presents today for abnormal vaginal bleeding. These changes started about 1 year ago when she was in [place] and she assumed it was from travel. But since then she has experienced twice monthly cycles some months and sometimes bleeding lasts 10-13 days (her normal is 4-5 days). Flow ranges from moderate to spotting. She is on day 10 of bleeding today.  Denies menopausal symptoms. Sees hematology for anemia.    Review of Systems  Constitutional: Negative.   Genitourinary:  Positive for menstrual problem.      Objective:    Physical Exam Constitutional:      Appearance: Normal appearance.  GU: declines  BP 116/76  Wt Readings from Last 3 Encounters:  02/26/21 172 lb (78 kg)  12/25/19 132 lb (59.9 kg)  09/08/16 129 lb (58.5 kg)        Assessment & Plan:   Problem List Items Addressed This Visit   None Visit Diagnoses     Abnormal uterine bleeding    -  Primary   Relevant Orders   CBC with Differential/Platelet   TSH   US PELVIS TRANSVAGINAL NON-OB (TV ONLY)      Plan: CBC, TSH today. Discussed perimenopause and bleeding patterns that can occur during this transition. We will schedule pelvic ultrasound to rule out uterine abnormalities. We did briefly discuss hormonal contraception for bleeding management if ultrasound is unremarkable. I also offered Megace/Provera to suppress current bleeding and she declined. She has never been on hormonal contraception but she is open to it. All questions answered.      Tamela Gammon DNP, 10:44 AM 10/09/2021

## 2021-10-10 LAB — CBC WITH DIFFERENTIAL/PLATELET
Absolute Monocytes: 484 cells/uL (ref 200–950)
Basophils Absolute: 39 cells/uL (ref 0–200)
Basophils Relative: 0.7 %
Eosinophils Absolute: 77 cells/uL (ref 15–500)
Eosinophils Relative: 1.4 %
HCT: 37.6 % (ref 35.0–45.0)
Hemoglobin: 12.1 g/dL (ref 11.7–15.5)
Lymphs Abs: 1716 cells/uL (ref 850–3900)
MCH: 26.7 pg — ABNORMAL LOW (ref 27.0–33.0)
MCHC: 32.2 g/dL (ref 32.0–36.0)
MCV: 83 fL (ref 80.0–100.0)
MPV: 10.6 fL (ref 7.5–12.5)
Monocytes Relative: 8.8 %
Neutro Abs: 3185 cells/uL (ref 1500–7800)
Neutrophils Relative %: 57.9 %
Platelets: 391 10*3/uL (ref 140–400)
RBC: 4.53 10*6/uL (ref 3.80–5.10)
RDW: 12.9 % (ref 11.0–15.0)
Total Lymphocyte: 31.2 %
WBC: 5.5 10*3/uL (ref 3.8–10.8)

## 2021-10-10 LAB — TSH: TSH: 3.34 mIU/L

## 2021-10-28 ENCOUNTER — Ambulatory Visit (INDEPENDENT_AMBULATORY_CARE_PROVIDER_SITE_OTHER): Payer: Medicaid Other | Admitting: Obstetrics and Gynecology

## 2021-10-28 ENCOUNTER — Other Ambulatory Visit: Payer: Self-pay

## 2021-10-28 ENCOUNTER — Ambulatory Visit (INDEPENDENT_AMBULATORY_CARE_PROVIDER_SITE_OTHER): Payer: Medicaid Other

## 2021-10-28 DIAGNOSIS — N939 Abnormal uterine and vaginal bleeding, unspecified: Secondary | ICD-10-CM

## 2021-10-28 NOTE — Progress Notes (Signed)
Erroneous encounter

## 2021-10-31 ENCOUNTER — Telehealth: Payer: Self-pay | Admitting: Obstetrics and Gynecology

## 2021-10-31 DIAGNOSIS — R9389 Abnormal findings on diagnostic imaging of other specified body structures: Secondary | ICD-10-CM

## 2021-10-31 NOTE — Telephone Encounter (Signed)
Please let the patient know that her ultrasound shows multiple small fibroids (not in her cavity), a thickened endometrium without obvious polyps and normal ovaries bilaterally. I have reviewed her records. Given her abnormal cycles and her age I would recommend that she have an endometrial biopsy to evaluate the lining of her uterus. As long as this is normal there are different options to help control her abnormal uterine bleeding. Please set this up.

## 2021-10-31 NOTE — Telephone Encounter (Signed)
Left message for patient to call.

## 2021-11-03 NOTE — Telephone Encounter (Signed)
Patient informed with below, order placed. Will sent message to appointments to call and schedule.

## 2021-11-06 NOTE — Telephone Encounter (Signed)
Appt scheduled 11/26/21.

## 2021-11-26 ENCOUNTER — Other Ambulatory Visit (HOSPITAL_COMMUNITY)
Admission: RE | Admit: 2021-11-26 | Discharge: 2021-11-26 | Disposition: A | Discharge status: Home / Self Care | Payer: Medicaid Other | Source: Ambulatory Visit | Attending: Obstetrics and Gynecology | Admitting: Obstetrics and Gynecology

## 2021-11-26 ENCOUNTER — Encounter: Payer: Self-pay | Admitting: Obstetrics and Gynecology

## 2021-11-26 ENCOUNTER — Other Ambulatory Visit: Payer: Self-pay

## 2021-11-26 ENCOUNTER — Ambulatory Visit (INDEPENDENT_AMBULATORY_CARE_PROVIDER_SITE_OTHER): Payer: Medicaid Other | Admitting: Obstetrics and Gynecology

## 2021-11-26 VITALS — BP 150/88 | HR 74 | Ht 66.0 in | Wt 137.0 lb

## 2021-11-26 DIAGNOSIS — D259 Leiomyoma of uterus, unspecified: Secondary | ICD-10-CM

## 2021-11-26 DIAGNOSIS — R300 Dysuria: Secondary | ICD-10-CM | POA: Diagnosis not present

## 2021-11-26 DIAGNOSIS — N939 Abnormal uterine and vaginal bleeding, unspecified: Secondary | ICD-10-CM | POA: Insufficient documentation

## 2021-11-26 NOTE — Progress Notes (Signed)
GYNECOLOGY  VISIT   HPI: 50 y.o.   Legally Separated Other or two or more races Not Hispanic or Latino  female   [phone] with Patient's last menstrual period was 11/21/2021.   here for endo biopsy.   She saw Marny Lowenstein, NP in 11/22. C/o AUB Typically cycles are every 30-31 days, sometimes 21 days.  She bleeds for 7-13 days. Can fill a pad in up to 1 hour at times, this heavy flow can last for 2 days. No BTB.   No contraception for over 13 years. She is on her cycle now.  She has some burning with urination.  10/09/21 TSH 3.34, Hgb 12.1   Ultrasound from 10/28/21: Pelvic ultrasound   Indications: abnormal uterine bleeding   Findings:   Uterus 10.29 x 7.92 x 6.19 cm, anteverted Fibroids: 1) 1.88 x 1.77 cm, anterior, intramural 2) 1.09 x 1.38 cm, intramural, lateral 3) 0.86 x 1.26 cm, posterior, subserosal 4) 0.71 x 0.75 cm, posterior, subserosal 5) 2.23 x 1.84 cm, posterior, intramural   Endometrium 10.3 mm, tri-layered, no masses    Left ovary 2.74 x 1.89 x 1.49 cm 2.83 x 1.51 cm follicle   Right ovary 7.61 x 2.01 x 1.35 cm   Trace free fluid     Impression:  Enlarged, anteverted uterus Multiple small intramural and subserosal myomas Symmetrical, tri-layered endometrium, no masses seen Normal ovaries bilaterally, simple follicle in left ovary       GYNECOLOGIC HISTORY: Patient's last menstrual period was 11/21/2021. 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      Multiple Vitamin (MULTI-VITAMIN PO) Take by mouth.     No current facility-administered medications for this visit.     ALLERGIES: Penicillins  Family History  Problem Relation Age of Onset   Hypertension Mother    Breast cancer Paternal Aunt        Age 9   Breast cancer Paternal 78        Age 15's    Social History   Socioeconomic History   Marital status: Legally Separated    Spouse name: Not on file   Number of children: Not on file   Years of education: Not on file   Highest education level: Not on file  Occupational History   Not on file  Tobacco  Use   Smoking status: Never   Smokeless tobacco: Never  Substance and Sexual Activity   Alcohol use: No    Alcohol/week: 0.0 standard drinks   Drug use: No   Sexual activity: Not Currently    Birth control/protection: None  Other Topics Concern   Not on file  Social History Narrative   Not on file   Social Determinants of Health   Financial Resource Strain: Not on file  Food Insecurity: Not on file  Transportation Needs: Not on file  Physical Activity: Not on file  Stress: Not on file  Social Connections: Not on file  Intimate Partner Violence: Not on file    Review of Systems  All other systems reviewed and are negative.  PHYSICAL EXAMINATION:    BP (!) 150/88    Pulse 74    Ht 5\' 6"  (1.676 m)    Wt 137 lb (62.1 kg)    LMP 11/21/2021    SpO2 99%    BMI 22.11 kg/m     General appearance: alert, cooperative and appears stated age  Pelvic: External genitalia:  no lesions, evidence of female circumcision              Urethra:  normal appearing urethra with no masses, tenderness or lesions              Bartholins and Skenes: normal                  Vagina: normal appearing vagina with normal color and discharge, no lesions              Cervix: no lesions               The risks of endometrial biopsy were reviewed and a consent was obtained.  A speculum was placed in the vagina and the cervix was cleansed with betadine. The pipelle was placed into the endometrial cavity. The uterus sounded to 9 cm. The pipelle filled with blood. The uterine evacuator was then used to perform the endometrial biopsy, taking care to get a representative sample, sampling 360 degrees of the uterine cavity. A large amount of tissue and blood was obtained. The speculum was removed. There were no complications.   Chaperone was present for exam.  Ultrasound images were reviewed with the patient and her husband  1. Abnormal uterine bleeding U/S with slightly thickened stripe, no clear polyps seen - Endometrial biopsy - Pregnancy, urine - Surgical pathology( [place]/ [facility]) -We discussed possible treatment options, specifically the minipill and the mirena IUD. A handout of the mirena IUD was given.   2. Uterine leiomyoma, unspecified location Intramural, no intracavitary myomas  3. Dysuria Mild - Urinalysis,Complete w/RFL Culture (on menses so blood is expected)  In addition to the endometrial biopsy, time was spent collecting her history and reviewing possible treatment options.

## 2021-11-26 NOTE — Patient Instructions (Signed)

## 2021-11-28 LAB — URINE CULTURE
MICRO NUMBER:: 12804147
SPECIMEN QUALITY:: ADEQUATE

## 2021-11-28 LAB — URINALYSIS, COMPLETE W/RFL CULTURE
Bilirubin Urine: NEGATIVE
Glucose, UA: NEGATIVE
Hyaline Cast: NONE SEEN /LPF
Ketones, ur: NEGATIVE
Nitrites, Initial: NEGATIVE
Protein, ur: NEGATIVE
Specific Gravity, Urine: 1.02 (ref 1.001–1.035)
pH: 5.5 (ref 5.0–8.0)

## 2021-11-28 LAB — PREGNANCY, URINE: Preg Test, Ur: NEGATIVE

## 2021-11-28 LAB — SURGICAL PATHOLOGY

## 2021-11-28 LAB — CULTURE INDICATED

## 2021-12-02 ENCOUNTER — Telehealth: Payer: Self-pay

## 2021-12-02 ENCOUNTER — Other Ambulatory Visit: Payer: Self-pay

## 2021-12-02 DIAGNOSIS — R8271 Bacteriuria: Secondary | ICD-10-CM

## 2021-12-02 MED ORDER — CEPHALEXIN 500 MG PO CAPS: 500.0000 mg | ORAL_CAPSULE | Freq: Two times a day (BID) | ORAL | 0 refills | Status: AC

## 2021-12-02 NOTE — Telephone Encounter (Signed)
Pt desired to have abx sent in due to experiencing sx of UTI. However, she was hoping that you could also send in Rx for fluconazole d/t hx of yeast infection after abx. Please advise.

## 2021-12-03 MED ORDER — FLUCONAZOLE 150 MG PO TABS: 150.0000 mg | ORAL_TABLET | Freq: Once | ORAL | 0 refills | Status: AC

## 2021-12-03 NOTE — Telephone Encounter (Signed)
Left detailed VM per DPR

## 2021-12-03 NOTE — Telephone Encounter (Signed)
I sent in one diflucan that she can take if she starts developing any symptoms of yeast.

## 2022-03-08 IMAGING — US US BREAST CYST ASPIRATION 1ST CYST
1 series · 2 of 2 positions shown · non-contrast
Comparison: Previous exams.

CLINICAL DATA: 3.5 cm benign simple cyst seen in the 9 o'clock
retroareolar left breast at recent mammography and ultrasound. The
patient has requested cyst aspiration due to pain and tenderness
associated with the cyst.

EXAM:
ULTRASOUND GUIDED LEFT BREAST CYST ASPIRATION

[Series 1: us breast cyst aspiration 1st cyst · 0.07mm/px · 2 of 2 slices shown]
[im 1/2]
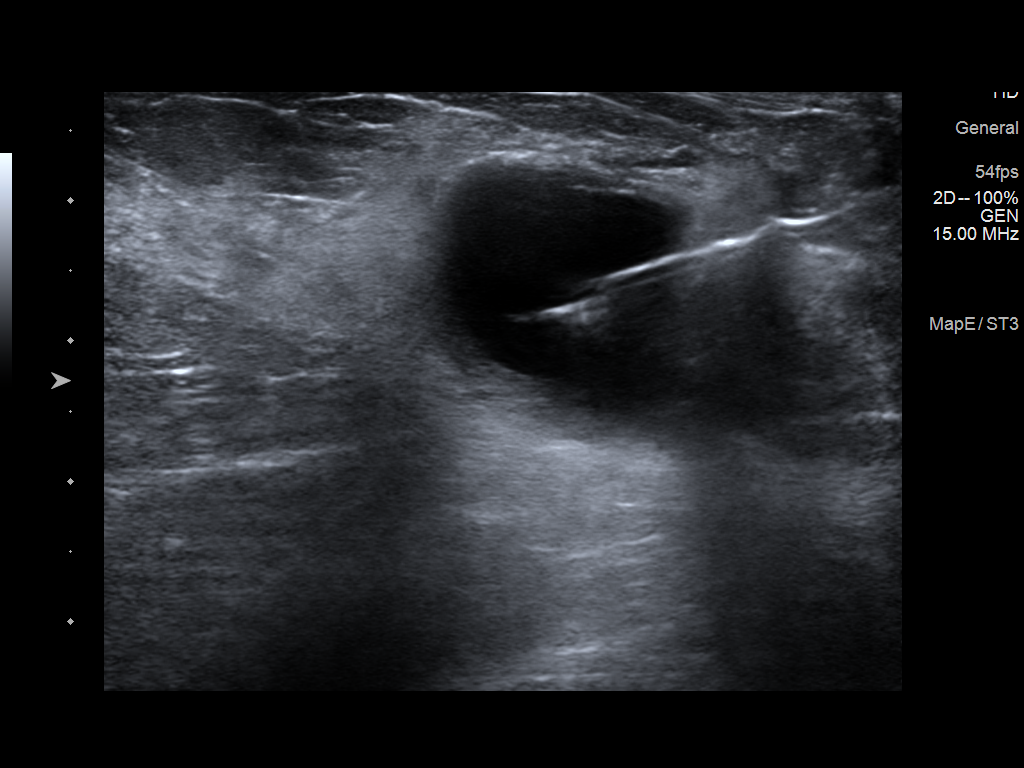
[im 2/2]
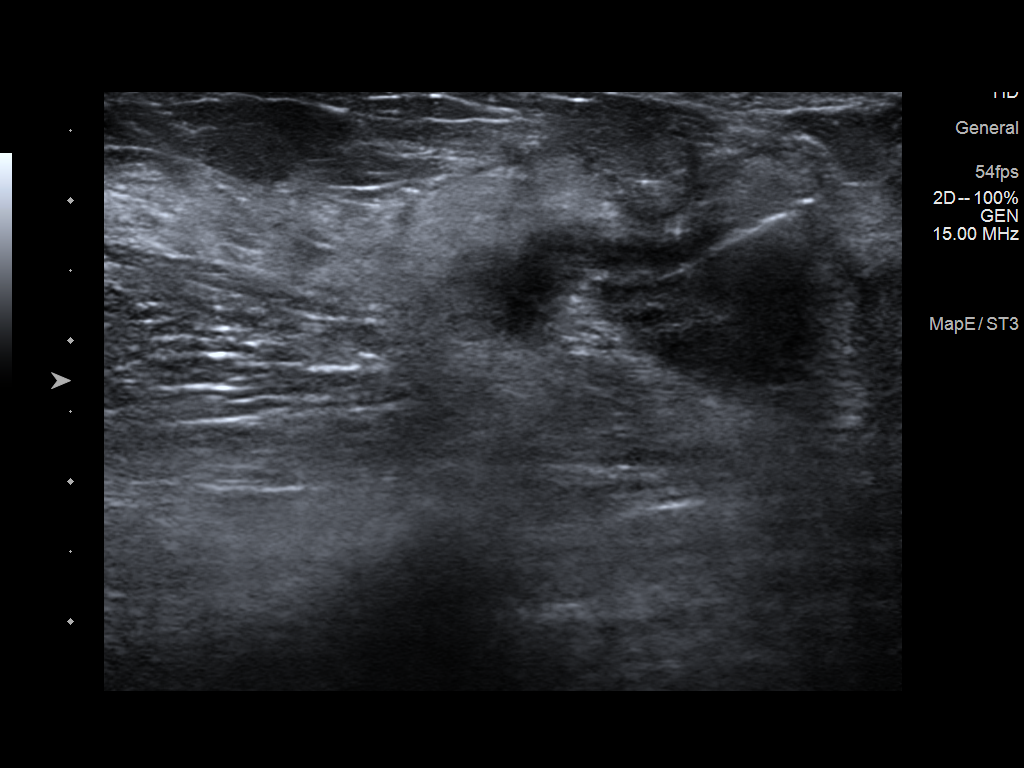

[2 of 2 positions shown; findings below may reference images not displayed]

PROCEDURE:
Using sterile technique, 1% lidocaine, under direct ultrasound
visualization, needle aspiration of the recently demonstrated 3.5 cm
simple cyst in the 9 o'clock retroareolar right breast was
performed. This yielded 7.6 cc of cloudy, yellow fluid. This
resulted in collapse of the cyst.
IMPRESSION: Ultrasound-guided aspiration of the recently demonstrated 3.5 cm
simple cyst in the 9 o'clock retroareolar left breast. No apparent
complications.

RECOMMENDATIONS:
Bilateral screening mammogram in 11 months when due.

## 2022-11-17 ENCOUNTER — Other Ambulatory Visit: Payer: Self-pay | Admitting: Physician Assistant

## 2022-11-17 DIAGNOSIS — Z1231 Encounter for screening mammogram for malignant neoplasm of breast: Secondary | ICD-10-CM

## 2022-11-20 ENCOUNTER — Ambulatory Visit
Admission: RE | Admit: 2022-11-20 | Discharge: 2022-11-20 | Disposition: A | Discharge status: Home / Self Care | Payer: Medicaid Other | Source: Ambulatory Visit | Attending: Physician Assistant | Admitting: Physician Assistant

## 2022-11-20 DIAGNOSIS — Z1231 Encounter for screening mammogram for malignant neoplasm of breast: Secondary | ICD-10-CM

## 2022-11-30 HISTORY — PX: BRAIN TUMOR EXCISION: SHX577
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
## 2023-03-14 ENCOUNTER — Other Ambulatory Visit: Payer: Self-pay

## 2023-03-14 ENCOUNTER — Encounter (HOSPITAL_BASED_OUTPATIENT_CLINIC_OR_DEPARTMENT_OTHER): Payer: Self-pay

## 2023-03-14 ENCOUNTER — Emergency Department (HOSPITAL_BASED_OUTPATIENT_CLINIC_OR_DEPARTMENT_OTHER)
Admission: EM | Admit: 2023-03-14 | Discharge: 2023-03-14 | Disposition: A | Discharge status: Home / Self Care | Payer: Medicaid Other | Attending: Emergency Medicine | Admitting: Emergency Medicine

## 2023-03-14 DIAGNOSIS — R030 Elevated blood-pressure reading, without diagnosis of hypertension: Secondary | ICD-10-CM | POA: Insufficient documentation

## 2023-03-14 DIAGNOSIS — E876 Hypokalemia: Secondary | ICD-10-CM | POA: Diagnosis not present

## 2023-03-14 LAB — BASIC METABOLIC PANEL
Anion gap: 10 (ref 5–15)
BUN: 13 mg/dL (ref 6–20)
CO2: 28 mmol/L (ref 22–32)
Calcium: 9.8 mg/dL (ref 8.9–10.3)
Chloride: 99 mmol/L (ref 98–111)
Creatinine, Ser: 0.75 mg/dL (ref 0.44–1.00)
GFR, Estimated: >60 mL/min (ref >60)
Glucose, Bld: 120 mg/dL — ABNORMAL HIGH (ref 70–99)
Potassium: 3.3 mmol/L — ABNORMAL LOW (ref 3.5–5.1)
Sodium: 137 mmol/L (ref 135–145)

## 2023-03-14 MED ORDER — AMLODIPINE BESYLATE 5 MG PO TABS: 5.0000 mg | ORAL_TABLET | Freq: Every day | ORAL | 0 refills | Status: AC

## 2023-03-14 NOTE — ED Provider Notes (Signed)
[place] EMERGENCY DEPARTMENT AT [facility] Provider Note   CSN: [id number] Arrival date & time: 03/14/23  1356     History  Chief Complaint  Patient presents with   Hypertension    Alisha Thomas is a 52 y.o. female.  Patient with no significant past medical history presents to the emergency department for evaluation of elevated blood pressure readings.  She was first alerted to this at an OB/GYN appointment several days ago where she was seen for UTI symptoms.  She had hematuria and dysuria at that time.  She was treated with Macrobid with improvement in symptoms.  Pain is now resolved.  No fevers or other abdominal pain.  She has been checking her blood pressures at home.  Her blood pressures have been running in the 160-180 systolic range in the 90-100 diastolic range.  She does not have a history of high blood pressure.  She denies stroke symptoms, headache, vision change, chest pain or shortness of breath.  Patient does admit to being anxious about her high blood pressure readings.       Home Medications Prior to Admission medications   Medication Sig Start Date End Date Taking? Authorizing Provider  IRON PO Take 1 tablet by mouth daily.     [provider]  Multiple Vitamin (MULTI-VITAMIN PO) Take by mouth.    [provider]      Allergies    Penicillins    Review of Systems   Review of Systems  Physical Exam Updated Vital Signs BP (!) 192/107 (BP Location: Right Arm)   Pulse 91   Temp 98.1 F (36.7 C) (Oral)   Resp 18   Ht  (1.676 m)   Wt 59.4 kg   SpO2 100%   BMI 21.14 kg/m   Physical Exam Vitals and nursing note reviewed.  Constitutional:      Appearance: She is well-developed. She is not diaphoretic.  HENT:     Head: Normocephalic and atraumatic.     Mouth/Throat:     Mouth: Mucous membranes are not dry.  Eyes:     Conjunctiva/sclera: Conjunctivae normal.  Neck:     Vascular: Normal carotid pulses. No JVD.      Trachea: Trachea normal. No tracheal deviation.  Cardiovascular:     Rate and Rhythm: Normal rate and regular rhythm.     Pulses: No decreased pulses.          Radial pulses are 2+ on the right side and 2+ on the left side.     Heart sounds: Normal heart sounds, S1 normal and S2 normal. No murmur heard. Pulmonary:     Effort: Pulmonary effort is normal. No respiratory distress.     Breath sounds: No wheezing.  Chest:     Chest wall: No tenderness.  Abdominal:     General: Bowel sounds are normal.     Palpations: Abdomen is soft.     Tenderness: There is no abdominal tenderness. There is no guarding or rebound.  Musculoskeletal:        General: Normal range of motion.     Cervical back: Normal range of motion and neck supple. No muscular tenderness.     Right lower leg: No edema.     Left lower leg: No edema.  Skin:    General: Skin is warm and dry.     Coloration: Skin is not pale.  Neurological:     Mental Status: She is alert.     ED Results /  Procedures / Treatments   Labs (all labs ordered are listed, but only abnormal results are displayed) Labs Reviewed  BASIC METABOLIC PANEL    EKG None  Radiology No results found.  Procedures Procedures    Medications Ordered in ED Medications - No data to display  ED Course/ Medical Decision Making/ A&P    Patient seen and examined. History obtained directly from patient.   Labs/EKG: Ordered BMP  Imaging: None ordered  Medications/Fluids: None ordered  Most recent vital signs reviewed and are as follows: BP (!) 192/107 (BP Location: Right Arm)   Pulse 91   Temp 98.1 F (36.7 C) (Oral)   Resp 18   Ht 5\' 6"  (1.676 m)   Wt 59.4 kg   SpO2 100%   BMI 21.14 kg/m   Initial impression: Elevated blood pressure  I do think that the patient's blood pressures have been running high enough to warrant treatment, especially now that the previous pain is improved from her UTI.  There may be an anxiety component.  If  BMP is reasonable, plan to start HCTZ 12.5 mg.  I asked patient to take this in the morning after checking her blood pressure and hold if systolic is less than 140, diastolic less than 90 and for her to follow-up with her PCP to determine long-term management.  Patient is agreeable.  3:31 PM Reassessment performed. Patient appears stable.  Labs personally reviewed and interpreted including: BMP with mild hypokalemia at 3.3, glucose 120 otherwise unremarkable with normal creatinine.  Reviewed pertinent lab work and imaging with patient at bedside. Questions answered.   Most current vital signs reviewed and are as follows: BP (!) 164/95   Pulse 83   Temp 98.1 F (36.7 C) (Oral)   Resp 20   Ht 5\' 6"  (1.676 m)   Wt 59.4 kg   SpO2 100%   BMI 21.14 kg/m   Plan: Discharge to home.   Prescriptions written for: Amlodipine 5 mg  Other home care instructions discussed: Blood pressure monitoring  ED return instructions discussed: Patient counseled to return if they have weakness in their arms or legs, slurred speech, trouble walking or talking, confusion, trouble with their balance, or if they have any other concerns. Patient verbalizes understanding and agrees with plan.   Follow-up instructions discussed: Patient encouraged to follow-up with their PCP in 7 days.                               Medical Decision Making Amount and/or Complexity of Data Reviewed Labs: ordered.  Risk Prescription drug management.   Patient here for persistently elevated blood pressure readings.  No signs of endorgan damage.  Blood pressures have been routinely 160-190 systolic.  Will have patient ensure her blood pressure is not low prior to taking the medication, but will initiate 5 mg amlodipine and have her follow-up with her doctor to discuss long-term management if indicated.  Indication for head CT, chest x-ray, other lab workup at this time.        Final Clinical Impression(s) / ED  Diagnoses Final diagnoses:  Elevated blood pressure reading  Hypokalemia    Rx / DC Orders ED Discharge Orders          Ordered    amLODipine (NORVASC) 5 MG tablet  Daily        03/14/23 1519              Renne Crigler,  PA-C 03/14/23 1535    Gwyneth Sprout, MD 03/17/23 1039

## 2023-03-14 NOTE — ED Triage Notes (Addendum)
Patient here POV from Home.  Endorse being at PCP Office 5 Days ago and began treatment for UTI. Noted BP to be high then and was instructed to monitor BP. Bp has steadily been above 150 Systolic and as high as 170-180 Systolic.  No Symptoms otherwise such as pain or SOB.   NAD Noted during Triage. A&Ox4. GCS 15. Ambulatory.

## 2023-03-14 NOTE — Discharge Instructions (Signed)
Please read and follow all provided instructions.  Your diagnoses today include:  1. Elevated blood pressure reading   2. Hypokalemia     Your blood pressure was high today (BP): BP (!) 164/95   Pulse 83   Temp 98.1 F (36.7 C) (Oral)   Resp 20   Ht 5\' 6"  (1.676 m)   Wt 59.4 kg   SpO2 100%   BMI 21.14 kg/m   Tests performed today include: Vital signs. See below for your results today. Basic metabolic panel: Showed low potassium otherwise normal kidney function  Medications prescribed:  Amlodipine: Take 1 tablet in the morning for elevated blood pressure  You may consider not taking the medication if you are systolic blood pressure (top number) is less than 140 or the diastolic blood pressure (bottom number) is less than 90.  Home care instructions:  Follow any educational materials contained in this packet.  Follow-up instructions: Please follow-up with your primary care provider in the next 7 days for a recheck of your symptoms and blood pressure.    Return instructions:  Please return to the Emergency Department if you experience worsening symptoms.  Return with severe chest pain, abdominal pain, or shortness of breath.  Return with severe headache, focal weakness, numbness, difficulty with speech or vision. Return with loss of vision or sudden vision change. Please return if you have any other emergent concerns.  Additional Information:  Your vital signs today were: BP (!) 164/95   Pulse 83   Temp 98.1 F (36.7 C) (Oral)   Resp 20   Ht 5\' 6"  (1.676 m)   Wt 59.4 kg   SpO2 100%   BMI 21.14 kg/m  If your blood pressure (BP) was elevated above 135/85 this visit, please have this repeated by your doctor within one month. --------------

## 2023-03-14 NOTE — ED Notes (Signed)
RN reviewed discharge instructions with pt. Pt verbalized understanding and had no further questions. VSS upon discharge.  

## 2023-03-16 ENCOUNTER — Ambulatory Visit: Payer: Medicaid Other | Admitting: Obstetrics & Gynecology

## 2023-04-10 ENCOUNTER — Emergency Department (HOSPITAL_BASED_OUTPATIENT_CLINIC_OR_DEPARTMENT_OTHER)
Admission: EM | Admit: 2023-04-10 | Discharge: 2023-04-11 | Disposition: A | Discharge status: Home / Self Care | Payer: Medicaid Other | Attending: Emergency Medicine | Admitting: Emergency Medicine

## 2023-04-10 ENCOUNTER — Other Ambulatory Visit: Payer: Self-pay

## 2023-04-10 ENCOUNTER — Emergency Department (HOSPITAL_BASED_OUTPATIENT_CLINIC_OR_DEPARTMENT_OTHER): Payer: Medicaid Other | Admitting: Radiology

## 2023-04-10 ENCOUNTER — Encounter (HOSPITAL_BASED_OUTPATIENT_CLINIC_OR_DEPARTMENT_OTHER): Payer: Self-pay | Admitting: *Deleted

## 2023-04-10 DIAGNOSIS — Z79899 Other long term (current) drug therapy: Secondary | ICD-10-CM | POA: Diagnosis not present

## 2023-04-10 DIAGNOSIS — U071 COVID-19: Secondary | ICD-10-CM | POA: Insufficient documentation

## 2023-04-10 DIAGNOSIS — I10 Essential (primary) hypertension: Secondary | ICD-10-CM | POA: Insufficient documentation

## 2023-04-10 DIAGNOSIS — R0602 Shortness of breath: Secondary | ICD-10-CM | POA: Diagnosis present

## 2023-04-10 LAB — CBC WITH DIFFERENTIAL/PLATELET
Abs Immature Granulocytes: 0.03 10*3/uL (ref 0.00–0.07)
Basophils Absolute: 0 10*3/uL (ref 0.0–0.1)
Basophils Relative: 1 %
Eosinophils Absolute: 0.2 10*3/uL (ref 0.0–0.5)
Eosinophils Relative: 3 %
HCT: 38.1 % (ref 36.0–46.0)
Hemoglobin: 12.7 g/dL (ref 12.0–15.0)
Immature Granulocytes: 1 %
Lymphocytes Relative: 19 %
Lymphs Abs: 1.1 10*3/uL (ref 0.7–4.0)
MCH: 27.6 pg (ref 26.0–34.0)
MCHC: 33.3 g/dL (ref 30.0–36.0)
MCV: 82.8 fL (ref 80.0–100.0)
Monocytes Absolute: 0.5 10*3/uL (ref 0.1–1.0)
Monocytes Relative: 8 %
Neutro Abs: 4.3 10*3/uL (ref 1.7–7.7)
Neutrophils Relative %: 68 %
Platelets: 349 10*3/uL (ref 150–400)
RBC: 4.6 MIL/uL (ref 3.87–5.11)
RDW: 12.9 % (ref 11.5–15.5)
WBC: 6.1 10*3/uL (ref 4.0–10.5)
nRBC: 0 % (ref 0.0–0.2)

## 2023-04-10 LAB — COMPREHENSIVE METABOLIC PANEL
ALT: 12 U/L (ref 0–44)
AST: 15 U/L (ref 15–41)
Albumin: 4.4 g/dL (ref 3.5–5.0)
Alkaline Phosphatase: 82 U/L (ref 38–126)
Anion gap: 10 (ref 5–15)
BUN: 14 mg/dL (ref 6–20)
CO2: 28 mmol/L (ref 22–32)
Calcium: 9.7 mg/dL (ref 8.9–10.3)
Chloride: 102 mmol/L (ref 98–111)
Creatinine, Ser: 0.72 mg/dL (ref 0.44–1.00)
GFR, Estimated: >60 mL/min (ref >60)
Glucose, Bld: 132 mg/dL — ABNORMAL HIGH (ref 70–99)
Potassium: 3.6 mmol/L (ref 3.5–5.1)
Sodium: 140 mmol/L (ref 135–145)
Total Bilirubin: 0.3 mg/dL (ref 0.3–1.2)
Total Protein: 7.8 g/dL (ref 6.5–8.1)

## 2023-04-10 LAB — GROUP A STREP BY PCR: Group A Strep by PCR: NOT DETECTED

## 2023-04-10 LAB — PREGNANCY, URINE: Preg Test, Ur: NEGATIVE

## 2023-04-10 LAB — SARS CORONAVIRUS 2 BY RT PCR: SARS Coronavirus 2 by RT PCR: POSITIVE — AB
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
## 2023-04-10 NOTE — ED Triage Notes (Signed)
Pt arrives by [facility] from home due to HTN.  Pt monitors her BP at home and takes amlodipine prn when BP is over 140/90 systolic. She took 5mg  amlodipine po at 20:45 as BP was 160/94.  Pt also called EMS.  No CP.  EKG was wnl for ems.  Pt is alert and oriented on arrival, no CP or sob.  Pt had a sore thoat this week and has been feeling generally weak this week but she is feeling better.

## 2023-04-11 NOTE — Discharge Instructions (Addendum)
Please follow-up with your primary care doctor, for your hypertension, you should monitor this at home, take your amlodipine.  Also you have COVID you should isolate for 5 days, and return to the ER if you have severe shortness of breath, chest pain, or intractable nausea or vomiting.

## 2023-04-22 ENCOUNTER — Encounter (HOSPITAL_BASED_OUTPATIENT_CLINIC_OR_DEPARTMENT_OTHER): Payer: Self-pay | Admitting: Emergency Medicine

## 2023-04-22 ENCOUNTER — Emergency Department (HOSPITAL_BASED_OUTPATIENT_CLINIC_OR_DEPARTMENT_OTHER): Payer: Medicaid Other

## 2023-04-22 ENCOUNTER — Emergency Department (HOSPITAL_BASED_OUTPATIENT_CLINIC_OR_DEPARTMENT_OTHER)
Admission: EM | Admit: 2023-04-22 | Discharge: 2023-04-22 | Disposition: A | Discharge status: Home / Self Care | Payer: Medicaid Other | Attending: Emergency Medicine | Admitting: Emergency Medicine

## 2023-04-22 ENCOUNTER — Other Ambulatory Visit: Payer: Self-pay

## 2023-04-22 DIAGNOSIS — I10 Essential (primary) hypertension: Secondary | ICD-10-CM | POA: Insufficient documentation

## 2023-04-22 DIAGNOSIS — R519 Headache, unspecified: Secondary | ICD-10-CM | POA: Diagnosis present

## 2023-04-22 DIAGNOSIS — R200 Anesthesia of skin: Secondary | ICD-10-CM | POA: Insufficient documentation

## 2023-04-22 DIAGNOSIS — D329 Benign neoplasm of meninges, unspecified: Secondary | ICD-10-CM | POA: Diagnosis not present

## 2023-04-22 DIAGNOSIS — Z79899 Other long term (current) drug therapy: Secondary | ICD-10-CM | POA: Insufficient documentation

## 2023-04-22 HISTORY — DX: Essential (primary) hypertension: I10

## 2023-04-22 LAB — CBC
HCT: 37.3 % (ref 36.0–46.0)
Hemoglobin: 12.6 g/dL (ref 12.0–15.0)
MCH: 27.2 pg (ref 26.0–34.0)
MCHC: 33.8 g/dL (ref 30.0–36.0)
MCV: 80.6 fL (ref 80.0–100.0)
Platelets: 366 10*3/uL (ref 150–400)
RBC: 4.63 MIL/uL (ref 3.87–5.11)
RDW: 13 % (ref 11.5–15.5)
WBC: 6.9 10*3/uL (ref 4.0–10.5)
nRBC: 0 % (ref 0.0–0.2)

## 2023-04-22 LAB — PREGNANCY, URINE: Preg Test, Ur: NEGATIVE

## 2023-04-22 LAB — BASIC METABOLIC PANEL
Anion gap: 11 (ref 5–15)
BUN: 14 mg/dL (ref 6–20)
CO2: 28 mmol/L (ref 22–32)
Calcium: 9.9 mg/dL (ref 8.9–10.3)
Chloride: 99 mmol/L (ref 98–111)
Creatinine, Ser: 0.66 mg/dL (ref 0.44–1.00)
GFR, Estimated: >60 mL/min (ref >60)
Glucose, Bld: 102 mg/dL — ABNORMAL HIGH (ref 70–99)
Potassium: 3.5 mmol/L (ref 3.5–5.1)
Sodium: 138 mmol/L (ref 135–145)

## 2023-04-22 LAB — URINALYSIS, ROUTINE W REFLEX MICROSCOPIC
Bilirubin Urine: NEGATIVE
Glucose, UA: NEGATIVE mg/dL
Hgb urine dipstick: NEGATIVE
Ketones, ur: NEGATIVE mg/dL
Leukocytes,Ua: NEGATIVE
Nitrite: NEGATIVE
Protein, ur: NEGATIVE mg/dL
Specific Gravity, Urine: <1.005 — ABNORMAL LOW (ref 1.005–1.030)
pH: 7 (ref 5.0–8.0)

## 2023-04-22 MED ORDER — PREDNISONE 10 MG (21) PO TBPK: ORAL_TABLET | Freq: Every day | ORAL | 0 refills | Status: DC

## 2023-04-22 MED ORDER — IBUPROFEN 800 MG PO TABS
800.0000 mg | ORAL_TABLET | Freq: Once | ORAL | Status: AC
  Administered 2023-04-22: 800 mg via ORAL
  Filled 2023-04-22: qty 1

## 2023-04-22 MED ORDER — PREDNISONE 50 MG PO TABS
60.0000 mg | ORAL_TABLET | Freq: Once | ORAL | Status: AC
  Administered 2023-04-22: 60 mg via ORAL
  Filled 2023-04-22: qty 1

## 2023-04-22 NOTE — ED Triage Notes (Signed)
Patient arrives ambulatory by POV c/o sharp pain to right side of head intermittently x 2 weeks. Patient reports having bilateral lower leg weakness and a numbness to her throat. Recently evaluated for HTN.

## 2023-04-22 NOTE — ED Notes (Signed)
Ambulatory to restroom

## 2023-04-22 NOTE — ED Provider Notes (Signed)
[place] EMERGENCY DEPARTMENT AT [facility] Provider Note   CSN: [id number] Arrival date & time: 04/22/23  1607     History  Chief Complaint  Patient presents with   Headache    Alisha Thomas is a 52 y.o. female.  52 year old female with a history of hypertension who presents to the emergency department with headache.  Patient reports that she has intermittently been having sharp stabbing sensation to her right occiput for over a week.  Says that in the past few days has become more constant.  Also says that she has been having changes of sensation to her throat as well but denies any difficulty swallowing or speaking.  Denies any vision changes, fevers, weakness.  Says that her bilateral lower extremities are not weak but they do have decreased sensation from her toes to her knees.  No neck or back pain.  No bowel or bladder incontinence.  Was diagnosed with COVID on 04/09/2022 but says that she is over those symptoms.  Did have an MRI years ago that did show a meningioma.       Home Medications Prior to Admission medications   Medication Sig Start Date End Date Taking? Authorizing Provider  predniSONE (STERAPRED UNI-PAK 21 TAB) 10 MG (21) TBPK tablet Take by mouth daily. Take 6 tabs by mouth daily  for 2 days, then 5 tabs for 2 days, then 4 tabs for 2 days, then 3 tabs for 2 days, 2 tabs for 2 days, then 1 tab by mouth daily for 2 days 04/22/23  Yes Rondel Baton, MD  amLODipine (NORVASC) 5 MG tablet Take 1 tablet (5 mg total) by mouth daily. 03/14/23   Renne Crigler, PA-C  IRON PO Take 1 tablet by mouth daily.     [provider]  Multiple Vitamin (MULTI-VITAMIN PO) Take by mouth.    [provider]      Allergies    Penicillins    Review of Systems   Review of Systems  Physical Exam Updated Vital Signs BP (!) 170/92   Pulse 88   Temp 98.1 F (36.7 C) (Oral)   Resp 16   Ht 5\' 4"  (1.626 m)   Wt 59.9 kg   SpO2 99%   BMI 22.66 kg/m   Physical Exam Vitals and nursing note reviewed.  Constitutional:      General: She is not in acute distress.    Appearance: She is well-developed.  HENT:     Head: Normocephalic and atraumatic.     Right Ear: External ear normal.     Left Ear: External ear normal.     Nose: Nose normal.     Mouth/Throat:     Mouth: Mucous membranes are moist.     Pharynx: Oropharynx is clear.  Eyes:     Extraocular Movements: Extraocular movements intact.     Conjunctiva/sclera: Conjunctivae normal.     Pupils: Pupils are equal, round, and reactive to light.  Cardiovascular:     Rate and Rhythm: Normal rate and regular rhythm.     Heart sounds: No murmur heard. Pulmonary:     Effort: Pulmonary effort is normal. No respiratory distress.     Breath sounds: Normal breath sounds.  Musculoskeletal:     Cervical back: Normal range of motion and neck supple. No rigidity.     Right lower leg: No edema.     Left lower leg: No edema.  Skin:    General: Skin is warm and dry.  Neurological:  Mental Status: She is alert and oriented to person, place, and time. Mental status is at baseline.     Comments: MENTAL STATUS: AAOx3 CRANIAL NERVES: II: Pupils equal and reactive 4 mm mm BL, no RAPD, no VF deficits III, IV, VI: EOM intact, no gaze preference or deviation, no nystagmus. V: normal sensation to light touch in V1, V2, and V3 segments bilaterally VII: no facial weakness or asymmetry, no nasolabial fold flattening VIII: normal hearing to speech and finger friction IX, X: normal palatal elevation, no uvular deviation XI: 5/5 head turn and 5/5 shoulder shrug bilaterally XII: midline tongue protrusion MOTOR: 5/5 strength in R shoulder flexion, elbow flexion and extension, and grip strength. 5/5 strength in L shoulder flexion, elbow flexion and extension, and grip strength.  5/5 strength in R hip and knee flexion, knee extension, ankle plantar and dorsiflexion. 5/5 strength in L hip and knee flexion,  knee extension, ankle plantar and dorsiflexion. SENSORY: Diminished sensation to light touch of bilateral lower extremities up to the knee.  Intact sensation to pinprick bilaterally in the lower extremities COORD: Normal finger to nose and heel to shin, no tremor, no dysmetria STATION: normal stance, no truncal ataxia GAIT: Normal   Psychiatric:        Mood and Affect: Mood normal.     ED Results / Procedures / Treatments   Labs (all labs ordered are listed, but only abnormal results are displayed) Labs Reviewed  BASIC METABOLIC PANEL - Abnormal; Notable for the following components:      Result Value   Glucose, Bld 102 (*)    All other components within normal limits  URINALYSIS, ROUTINE W REFLEX MICROSCOPIC - Abnormal; Notable for the following components:   Color, Urine COLORLESS (*)    Specific Gravity, Urine <1.005 (*)    All other components within normal limits  CBC  PREGNANCY, URINE    EKG EKG Interpretation  Date/Time:  Thursday Apr 22 2023 16:20:44 EDT Ventricular Rate:  85 PR Interval:  142 QRS Duration: 82 QT Interval:  374 QTC Calculation: 445 R Axis:   79 Text Interpretation: Normal sinus rhythm Normal ECG When compared with ECG of 10-Apr-2023 22:04, No significant change was found Confirmed by Vonita Moss [phone]) on 04/22/2023 5:41:12 PM  Radiology CT Head Wo Contrast  Result Date: 04/22/2023 CLINICAL DATA:  R occipital headache EXAM: CT HEAD WITHOUT CONTRAST TECHNIQUE: Contiguous axial images were obtained from the base of the skull through the vertex without intravenous contrast. RADIATION DOSE REDUCTION: This exam was performed according to the departmental dose-optimization program which includes automated exposure control, adjustment of the mA and/or kV according to patient size and/or use of iterative reconstruction technique. COMPARISON:  CT Apr 29, 2008. FINDINGS: Brain: In comparison to 2009, significant interval increase in size of extra-axial  dural-based masses along the high right parasagittal convexity along the falx which likely represents two meningiomas next to each other or a complex meningioma. Areas of hyperdensity within the mass are favored to represent mineralization/calcification over hemorrhage. These measure approximately 2.0 x 1.7 cm and 1.5 by 1.7 cm. No visible adjacent brain edema. Mild local mass effect without midline shift. No evidence of acute large vascular territory infarct or hydrocephalus. No convincing evidence of acute hemorrhage. Vascular: No hyperdense vessel identified. Skull: No acute fracture. Sinuses/Orbits: Clear sinuses.  No acute orbital findings. Other: No mastoid effusions. IMPRESSION: In comparison to 2009, significant interval increase in size of 2.0 cm and 1.7 cm extra-axial dural-based masses along the  high right parasagittal convexity, which likely represent two adjacent meningiomas or one complex meningioma. Areas of hyperdensity within the mass(es) is favored to represent mineralization/calcification over hemorrhage. No visible adjacent brain edema or significant mass effect. A brain MRI with contrast is recommended to further characterize. Electronically Signed   By: Feliberto Harts M.D.   On: 04/22/2023 18:59    Procedures Procedures    Medications Ordered in ED Medications  ibuprofen (ADVIL) tablet 800 mg (800 mg Oral Given 04/22/23 1753)  predniSONE (DELTASONE) tablet 60 mg (60 mg Oral Given 04/22/23 1957)    ED Course/ Medical Decision Making/ A&P Clinical Course as of 04/22/23 2337  Thu Apr 22, 2023  1929 Paticia Stack from neurosurgery consulted.  Recommends prednisone Dosepak and follow-up as an outpatient.  They will attempt to coordinate MRI for the patient as well. [RP]    Clinical Course User Index [RP] Rondel Baton, MD                             Medical Decision Making Amount and/or Complexity of Data Reviewed Labs: ordered. Radiology:  ordered.  Risk Prescription drug management.   Krislyn Frankenfield is a 52 y.o. female with comorbidities that complicate the patient evaluation including meningioma who presents emergency department with headache and numbness of her legs  Initial Ddx:  Meningioma growth, ICH, stroke, migraine, peripheral neuropathy, spinal cord compression  MDM:  As for the patient's meningioma may have grown given her symptoms.  Could also potentially represent an ICH.  Will obtain a CT head to evaluate for these.  No neck pain or back pain to suggest a cord issue.  Does not have any other symptoms that are suggestive such as bowel or bladder incontinence.  Imaging negative she may be having a migraine with a peripheral neuropathy causing her leg numbness.  Plan:  Labs CT head without contrast Ibuprofen  ED Summary/Re-evaluation:  Patient's head CT did show that the meningioma had grown.  Based on the location could potentially be causing the numbness and changes in sensation of her legs.  Did consult neurosurgery who recommended starting her on a prednisone taper and having her follow-up in clinic.  They did not feel that hospitalization is warranted at this time.  Since she has no weakness has ambulate in the emergency department without difficulty agree that she does not need hospitalization currently.  Prednisone given to her in the emergency department and the taper was sent to her pharmacy.  She was instructed to follow-up with neurosurgery and informed of the findings of her CT scan.  This patient presents to the ED for concern of complaints listed in HPI, this involves an extensive number of treatment options, and is a complaint that carries with it a high risk of complications and morbidity. Disposition including potential need for admission considered.   Dispo: DC Home. Return precautions discussed including, but not limited to, those listed in the AVS. Allowed pt time to ask questions which were  answered fully prior to dc.  Records reviewed Outpatient Clinic Notes The following labs were independently interpreted: Chemistry and show no acute abnormality I independently reviewed the following imaging with scope of interpretation limited to determining acute life threatening conditions related to emergency care: CT Head and agree with the radiologist interpretation with the following exceptions: none I personally reviewed and interpreted cardiac monitoring: normal sinus rhythm  I personally reviewed and interpreted the pt's EKG: see above  for interpretation  I have reviewed the patients home medications and made adjustments as needed Consults: Neurosurgery       Final Clinical Impression(s) / ED Diagnoses Final diagnoses:  Leg numbness  Nonintractable headache, unspecified chronicity pattern, unspecified headache type  Meningioma (HCC)    Rx / DC Orders ED Discharge Orders          Ordered    predniSONE (STERAPRED UNI-PAK 21 TAB) 10 MG (21) TBPK tablet  Daily        04/22/23 1942              Rondel Baton, MD 04/22/23 2337

## 2023-04-22 NOTE — ED Notes (Signed)
Pt discharged to home, NAD noted 

## 2023-04-22 NOTE — Discharge Instructions (Signed)
You were seen for your headache and you are found to have a meningioma in the emergency department.   At home, please take the prednisone that we have prescribed you.    Follow-up with the neurosurgeons next week.  If you do not hear from them by tomorrow afternoon for an appointment please give them a call.  Return immediately to the emergency department if you experience any of the following: Vision changes, weakness, numbness, or any other concerning symptoms.    Thank you for visiting our Emergency Department. It was a pleasure taking care of you today.

## 2023-06-23 ENCOUNTER — Ambulatory Visit: Payer: Medicaid Other | Attending: Nurse Practitioner

## 2023-06-23 ENCOUNTER — Other Ambulatory Visit: Payer: Self-pay

## 2023-06-23 DIAGNOSIS — R2689 Other abnormalities of gait and mobility: Secondary | ICD-10-CM | POA: Diagnosis present

## 2023-06-23 DIAGNOSIS — M6281 Muscle weakness (generalized): Secondary | ICD-10-CM | POA: Diagnosis present

## 2023-06-23 DIAGNOSIS — R2681 Unsteadiness on feet: Secondary | ICD-10-CM | POA: Insufficient documentation

## 2023-06-23 DIAGNOSIS — R262 Difficulty in walking, not elsewhere classified: Secondary | ICD-10-CM | POA: Insufficient documentation

## 2023-06-23 NOTE — Therapy (Signed)
OUTPATIENT PHYSICAL THERAPY NEURO EVALUATION   Patient Name: Alisha Thomas MRN: [id number] DOB:August 04, 1971, 52 y.o., female Today's Date: 06/23/2023   PCP: Jordan Hawks, PA-C REFERRING PROVIDER: Carrolyn Leigh, FNP  END OF SESSION:  PT End of Session - 06/23/23 0942     Visit Number 1    Number of Visits 17    Date for PT Re-Evaluation 08/18/23    Authorization Type [employer]    PT Start Time 0855   late arrival   PT Stop Time 0930    PT Time Calculation (min) 35 min             Past Medical History:  Diagnosis Date   Hypertension    Ovarian cyst    Pelvic adhesions    Stomach ulcer    Past Surgical History:  Procedure Laterality Date   BREAST CYST ASPIRATION Left 2021   ECTOPIC PREGNANCY SURGERY     OVARIAN CYST SURGERY     PELVIC LAPAROSCOPY  11/30/2006   Diag Lap lysis of adhesions   Patient Active Problem List   Diagnosis Date Noted   Symptomatic anemia 03/26/2020   Iron deficiency anemia due to chronic blood loss 02/24/2018   Fibroids 07/04/2015   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  Rationale for Evaluation and Treatment: Rehabilitation  SUBJECTIVE:                                                                                                                                                                                             SUBJECTIVE STATEMENT: Right frontal meningoma resection 05/25/23 and now experiencing LLE weakness/paresis with difficulty in walking and reduced functional  mobility. Currently requiring use of RW and rigid AFO on left Pt accompanied by: friend  PERTINENT HISTORY: hx of  meningoma   PAIN:  Are you having pain? No  PRECAUTIONS: Fall  RED FLAGS: None   WEIGHT BEARING RESTRICTIONS: No  FALLS: Has patient fallen in last 6 months? No  LIVING ENVIRONMENT: Lives with: lives with their family Lives in: House/apartment Stairs: Yes: External: 38 steps; can reach both Has following equipment at home: Dan Humphreys - 2 wheeled, shower seat  PLOF: Independent with basic ADLs and Independent with household mobility with device Works in schools as Runner, broadcasting/film/video PATIENT GOALS: improve walking  OBJECTIVE:  DIAGNOSTIC FINDINGS: surgical procedure  COGNITION: Overall cognitive status: Within functional limits for tasks assessed   SENSATION: WFL  COORDINATION: Impaired LLE due to weakness  EDEMA:  none  MUSCLE TONE: LLE: Flaccid dorsiflexors/toe extensors, plantarflexors. Left hamstrings   MUSCLE LENGTH: WNL, developing some tightness in left ankle dorsiflexion due to paralysis  DTRs:  NT  POSTURE: No Significant postural limitations  LOWER EXTREMITY ROM:     Active  Right Eval Left Eval  Hip flexion 120 100  Hip extension    Hip abduction    Hip adduction    Hip internal rotation    Hip external rotation    Knee flexion 120 120  Knee extension 0 0  Ankle dorsiflexion 20 0  Ankle plantarflexion 35 0  Ankle inversion    Ankle eversion     (Blank rows = not tested)  LLE limited due to weakness  LOWER EXTREMITY MMT:    MMT Right Eval Left Eval  Hip flexion 5 3+  Hip extension    Hip abduction 5 3  Hip adduction 5 5  Hip internal rotation    Hip external rotation    Knee flexion 5 3-  Knee extension 5 3+  Ankle dorsiflexion 5 0  Ankle plantarflexion 5 0  Ankle inversion 5 0  Ankle eversion 5 0  (Blank rows = not tested)  BED MOBILITY:  indep  TRANSFERS: Assistive device utilized: Environmental consultant - 2 wheeled  Sit to stand: Complete Independence and Modified independence Stand to sit: Complete Independence and Modified  independence Chair to chair: Modified independence Floor:  NT  RAMP:  Level of Assistance: SBA Assistive device utilized: Environmental consultant - 2 wheeled Ramp Comments:   CURB:  Level of Assistance: SBA and CGA Assistive device utilized: Environmental consultant - 2 wheeled Curb Comments:   STAIRS: Level of Assistance: Modified independence and SBA Stair Negotiation Technique: Step to Pattern with Bilateral Rails Number of Stairs: 12  Height of Stairs: 4-6"  Comments: pt negotiating 38 steps to her apartment daily  GAIT: Gait pattern: step to pattern Distance walked:  Assistive device utilized: Environmental consultant - 2 wheeled Level of assistance: Modified independence Comments: use of fixed left AFO  FUNCTIONAL TESTS:  5 times sit to stand: 25 sec Timed up and go (TUG): 30 sec w/ RW and left AFO Berg Balance Scale: 42/56 Gait speed: TBD  TODAY'S TREATMENT:                                                                                                                              DATE: 06/23/23    PATIENT EDUCATION: Education details: assessment details, HEP initiated Person educated: Patient Education method: Chief Technology Officer Education comprehension: needs further education  HOME EXERCISE PROGRAM: Access Code: [email or web address] URL: [email or web address] Date: 06/23/2023 Prepared by: Shary Decamp  Exercises - Seated Hamstring Curls with Resistance  - 1 x daily - 7 x weekly - 3 sets - 10 reps - Seated Knee  Extension with Resistance  - 1 x daily - 7 x weekly - 3 sets - 10 reps - Side Stepping with Resistance at Ankles and Counter Support  - 1 x daily - 7 x weekly - 1-3 sets - 1-2 minute rounds hold  GOALS: Goals reviewed with patient? Yes  SHORT TERM GOALS: Target date: 07/21/2023    Patient will be independent in HEP to improve functional outcomes Baseline: Goal status: INITIAL  2.  Demo improved BLE strength and dynamic balance as evidenced by 15 sec 5xSTS test Baseline: 25 sec Goal  status: INITIAL  3.  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  Demo ambulation speed 2.8 ft/sec to improve efficiency of community ambulation Baseline: TBD Goal status: INITIAL  3.  Demo low risk for falls per score 50/56 Berg Balance Test Baseline: 42/56 Goal status: INITIAL  4.  Demo low risk for falls per time 12 sec TUG test Baseline: 30 sec w/ RW Goal status: INITIAL  5.  Demo modified independent stair ambulation w/ reciprocal pattern to improve safety and efficiency with entering/exiting home environment Baseline: step-to pattern bilat HR Goal status: INITIAL    ASSESSMENT:  CLINICAL IMPRESSION: Patient is a 52 y.o. lady who was seen today for physical therapy evaluation and treatment for generalized weakness, difficulty in walking, unsteadiness on feet, and high risk for falls. Demonstrates weakness primarily affecting LLE w/ flaccid paralysis to left ankle and more prominent hamstring weakness ambulating w/ RW and step-to gait pattern.  Exhibits high risk for falls per outcome measures and reduced functional independence and activity participation.  PT services indicated to provide relevant interventions, adaptations, and compensations to improve mobility and reduce risk for falls   OBJECTIVE IMPAIRMENTS: Abnormal gait, decreased activity tolerance, decreased balance, decreased coordination, decreased endurance, decreased knowledge of use of DME, decreased mobility, difficulty walking, decreased ROM, decreased strength, and impaired tone.   ACTIVITY LIMITATIONS:  carrying, lifting, stairs, transfers, and locomotion level  PARTICIPATION LIMITATIONS: meal prep, cleaning, laundry, driving, shopping, community activity, and occupation  PERSONAL FACTORS: Time since onset of injury/illness/exacerbation and 1 comorbidity: PMH  are also affecting patient's functional outcome.   REHAB POTENTIAL: Excellent  CLINICAL DECISION MAKING: Evolving/moderate complexity  EVALUATION COMPLEXITY: Moderate  PLAN:  PT FREQUENCY: 2x/week  PT DURATION: 8 weeks  PLANNED INTERVENTIONS: Therapeutic exercises, Therapeutic activity, Neuromuscular re-education, Balance training, Gait training, Patient/Family education, Self Care, Joint mobilization, Stair training, Vestibular training, Canalith repositioning, Orthotic/Fit training, DME instructions, Aquatic Therapy, Dry Needling, Electrical stimulation, Spinal mobilization, Cryotherapy, Moist heat, Taping, Ionotophoresis 4mg /ml Dexamethasone, and Manual therapy  PLAN FOR NEXT SESSION: gait speed, floor to stand transfer,  HEP review and additions   11:30 AM, 06/23/23 M. Shary Decamp, PT, DPT Physical Therapist- [place] Office Number: [phone]

## 2023-06-30 ENCOUNTER — Ambulatory Visit: Payer: Medicaid Other | Admitting: Physical Therapy

## 2023-06-30 ENCOUNTER — Encounter: Payer: Self-pay | Admitting: Physical Therapy

## 2023-06-30 DIAGNOSIS — R262 Difficulty in walking, not elsewhere classified: Secondary | ICD-10-CM | POA: Diagnosis not present

## 2023-06-30 DIAGNOSIS — R2681 Unsteadiness on feet: Secondary | ICD-10-CM

## 2023-06-30 DIAGNOSIS — M6281 Muscle weakness (generalized): Secondary | ICD-10-CM

## 2023-06-30 DIAGNOSIS — R2689 Other abnormalities of gait and mobility: Secondary | ICD-10-CM

## 2023-06-30 NOTE — Therapy (Signed)
OUTPATIENT PHYSICAL THERAPY NEURO TREATMENT    Patient Name: Pattianne Meline MRN: [id number] DOB:1971/07/12, 52 y.o., female Today's Date: 06/30/2023   PCP: Jordan Hawks, PA-C REFERRING PROVIDER: Carrolyn Leigh, FNP  END OF SESSION:  PT End of Session - 06/30/23 1113     Visit Number 2    Number of Visits 17    Date for PT Re-Evaluation 08/18/23    Authorization Type BCBS    PT Start Time 1103    PT Stop Time 1143    PT Time Calculation (min) 40 min    Activity Tolerance Patient tolerated treatment well    Behavior During Therapy WFL for tasks assessed/performed              Past Medical History:  Diagnosis Date   Hypertension    Ovarian cyst    Pelvic adhesions    Stomach ulcer    Past Surgical History:  Procedure Laterality Date   BREAST CYST ASPIRATION Left 2021   ECTOPIC PREGNANCY SURGERY     OVARIAN CYST SURGERY     PELVIC LAPAROSCOPY  11/30/2006   Diag Lap lysis of adhesions   Patient Active Problem List   Diagnosis Date Noted   Symptomatic anemia 03/26/2020   Iron deficiency anemia due to chronic blood loss 02/24/2018   Fibroids 07/04/2015   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   Peptic ulcer 09/10/2011   Ovarian cyst    Pelvic adhesions     ONSET DATE: 04/22/23  REFERRING DIAG: Z74.09 (ICD-10-CM) - Other reduced mobility Z78.9 (ICD-10-CM) - Other specified health status 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-10-CM) - Benign neoplasm of cerebral meninges Z98.890 (ICD-10-CM) - Other specified postprocedural states  THERAPY DIAG:  Difficulty in walking, not elsewhere classified  Muscle weakness (generalized)  Other abnormalities of gait and mobility  Unsteadiness on feet  Rationale for Evaluation and Treatment: Rehabilitation  SUBJECTIVE:                                                                                                                                                                                             SUBJECTIVE STATEMENT:  Doing OK this morning,  finishing steroid medications today and feeling a little dizzy from them. I'd like to get rid of the walker and be able to walk without it.   Pt accompanied by: friend  PERTINENT HISTORY: hx of meningoma   PAIN:  Are you having pain? No 0/10  PRECAUTIONS: Fall  RED FLAGS: None   WEIGHT BEARING RESTRICTIONS: No  FALLS: Has patient fallen in last 6 months? No  LIVING ENVIRONMENT: Lives with: lives with their family Lives in: House/apartment Stairs: Yes:  External: 38 steps; can reach both Has following equipment at home: Walker - 2 wheeled, shower seat  PLOF: Independent with basic ADLs and Independent with household mobility with device Works in schools as Runner, broadcasting/film/video PATIENT GOALS: improve walking  OBJECTIVE:   DIAGNOSTIC FINDINGS: surgical procedure  COGNITION: Overall cognitive status: Within functional limits for tasks assessed   SENSATION: WFL  COORDINATION: Impaired LLE due to weakness  EDEMA:  none  MUSCLE TONE: LLE: Flaccid dorsiflexors/toe extensors, plantarflexors. Left hamstrings   MUSCLE LENGTH: WNL, developing some tightness in left ankle dorsiflexion due to paralysis  DTRs:  NT  POSTURE: No Significant postural limitations  LOWER EXTREMITY ROM:     Active  Right Eval Left Eval  Hip flexion 120 100  Hip extension    Hip abduction    Hip adduction    Hip internal rotation    Hip external rotation    Knee flexion 120 120  Knee extension 0 0  Ankle dorsiflexion 20 0  Ankle plantarflexion 35 0  Ankle inversion    Ankle eversion     (Blank rows = not tested)  LLE limited due to weakness  LOWER EXTREMITY MMT:    MMT Right Eval Left Eval  Hip flexion 5 3+  Hip extension    Hip abduction 5 3  Hip adduction 5 5  Hip internal rotation    Hip external rotation    Knee flexion 5 3-  Knee extension 5 3+  Ankle dorsiflexion 5 0  Ankle plantarflexion 5 0  Ankle inversion 5 0  Ankle eversion 5 0  (Blank rows = not tested)  BED  MOBILITY:  indep  TRANSFERS: Assistive device utilized: Environmental consultant - 2 wheeled  Sit to stand: Complete Independence and Modified independence Stand to sit: Complete Independence and Modified independence Chair to chair: Modified independence Floor:  NT  RAMP:  Level of Assistance: SBA Assistive device utilized: Environmental consultant - 2 wheeled Ramp Comments:   CURB:  Level of Assistance: SBA and CGA Assistive device utilized: Environmental consultant - 2 wheeled Curb Comments:   STAIRS: Level of Assistance: Modified independence and SBA Stair Negotiation Technique: Step to Pattern with Bilateral Rails Number of Stairs: 12  Height of Stairs: 4-6"  Comments: pt negotiating 38 steps to her apartment daily  GAIT: Gait pattern: step to pattern Distance walked:  Assistive device utilized: Environmental consultant - 2 wheeled Level of assistance: Modified independence Comments: use of fixed left AFO  FUNCTIONAL TESTS:  5 times sit to stand: 25 sec Timed up and go (TUG): 30 sec w/ RW and left AFO Berg Balance Scale: 42/56 Gait speed: TBD  TODAY'S TREATMENT:                                                                                                                              DATE:   06/30/23  Gait speed 1.22ft/sec  TherEx  Nustep L5x6 minutes BLEs only STS 4 inch box under R foot for  more WB L LE 2x10  Forward steps ups L LE with BUE support on RW progressing to no UEs- needed occasional ModA to prevent fall when stepping down off box due to L foot not clearing Forward lunges onto 6 inch box cues for form, max loading onto L LE  Hip hikes x12 B MinA to prevent L knee hyperextension in stance      NMR  Standing on blue foam pad 2x30 seconds EO, 2x30 seconds EC Semi-tandem stance blue foam pad 2x30 seconds B EO    PATIENT EDUCATION: Education details: assessment details, HEP initiated Person educated: Patient Education method: Chief Technology Officer Education comprehension: needs further education  HOME  EXERCISE PROGRAM: Access Code: [email or web address] URL: [email or web address] Date: 06/23/2023 Prepared by: Shary Decamp  Exercises - Seated Hamstring Curls with Resistance  - 1 x daily - 7 x weekly - 3 sets - 10 reps - Seated Knee Extension with Resistance  - 1 x daily - 7 x weekly - 3 sets - 10 reps - Side Stepping with Resistance at Ankles and Counter Support  - 1 x daily - 7 x weekly - 1-3 sets - 1-2 minute rounds hold  GOALS: Goals reviewed with patient? Yes  SHORT TERM GOALS: Target date: 07/21/2023    Patient will be independent in HEP to improve functional outcomes Baseline: Goal status: INITIAL  2.  Demo improved BLE strength and dynamic balance as evidenced by 15 sec 5xSTS test Baseline: 25 sec Goal status: INITIAL  3.  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  Demo ambulation speed 2.8 ft/sec to improve efficiency of community ambulation Baseline: TBD Goal status: INITIAL  3.  Demo low risk for falls per score 50/56 Berg Balance Test Baseline: 42/56 Goal status: INITIAL  4.  Demo low risk for falls per time 12 sec TUG test Baseline: 30 sec w/ RW Goal status: INITIAL  5.  Demo modified independent stair ambulation w/ reciprocal pattern to improve safety and efficiency with entering/exiting home environment Baseline: step-to pattern bilat HR Goal status: INITIAL    ASSESSMENT:  CLINICAL IMPRESSION:  Hilaree arrives today doing well, really motivated to improve with skilled PT services, has been very compliant with HEP and working hard on steps on her  own to help build strength. We started on the Nustep and then otherwise focused on functional strengthening and balance tasks today. Will continue to progress as appropriate.   OBJECTIVE IMPAIRMENTS: Abnormal gait, decreased activity tolerance, decreased balance, decreased coordination, decreased endurance, decreased knowledge of use of DME, decreased mobility, difficulty walking, decreased ROM, decreased strength, and impaired tone.   ACTIVITY LIMITATIONS: carrying, lifting, stairs, transfers, and locomotion level  PARTICIPATION LIMITATIONS: meal prep, cleaning, laundry, driving, shopping, community activity, and occupation  PERSONAL FACTORS: Time since onset of injury/illness/exacerbation and 1 comorbidity: PMH  are also affecting patient's functional outcome.   REHAB POTENTIAL: Excellent  CLINICAL DECISION MAKING: Evolving/moderate complexity  EVALUATION COMPLEXITY: Moderate  PLAN:  PT FREQUENCY: 2x/week  PT DURATION: 8 weeks  PLANNED INTERVENTIONS: Therapeutic exercises, Therapeutic activity, Neuromuscular re-education, Balance training, Gait training, Patient/Family education, Self Care, Joint mobilization,  Stair training, Vestibular training, Canalith repositioning, Orthotic/Fit training, DME instructions, Aquatic Therapy, Dry Needling, Electrical stimulation, Spinal mobilization, Cryotherapy, Moist heat, Taping, Ionotophoresis 4mg /ml Dexamethasone, and Manual therapy  PLAN FOR NEXT SESSION: floor to stand transfer,  HEP review and additions, strength and balance    Nedra Hai, PT, DPT 06/30/23 11:45 AM

## 2023-06-30 NOTE — Therapy (Signed)
OUTPATIENT PHYSICAL THERAPY NEURO TREATMENT   Patient Name: Alisha Thomas MRN: [id number] DOB:05-25-1971, 52 y.o., female Today's Date: 07/05/2023   PCP: Jordan Hawks, PA-C REFERRING PROVIDER: Carrolyn Leigh, FNP  END OF SESSION:  PT End of Session - 07/05/23 1016     Visit Number 3    Number of Visits 17    Date for PT Re-Evaluation 08/18/23    Authorization Type BCBS    PT Start Time 0933    PT Stop Time 1014    PT Time Calculation (min) 41 min    Equipment Utilized During Treatment Gait belt    Activity Tolerance Patient tolerated treatment well    Behavior During Therapy WFL for tasks assessed/performed              Past Medical History:  Diagnosis Date   Hypertension    Ovarian cyst    Pelvic adhesions    Stomach ulcer    Past Surgical History:  Procedure Laterality Date   BREAST CYST ASPIRATION Left 2021   ECTOPIC PREGNANCY SURGERY     OVARIAN CYST SURGERY     PELVIC LAPAROSCOPY  11/30/2006   Diag Lap lysis of adhesions   Patient Active Problem List   Diagnosis Date Noted   Symptomatic anemia 03/26/2020   Iron deficiency anemia due to chronic blood loss 02/24/2018   Fibroids 07/04/2015   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   Peptic ulcer 09/10/2011   Ovarian cyst    Pelvic adhesions     ONSET DATE: 04/22/23  REFERRING DIAG: Z74.09 (ICD-10-CM) - Other reduced mobility Z78.9 (ICD-10-CM) - Other specified health status 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 Denies questions on HEP- performing them daily.  Pt accompanied by: friend  PERTINENT HISTORY: hx of meningoma   PAIN:  Are you having pain? No  PRECAUTIONS: Fall  RED FLAGS: None   WEIGHT BEARING RESTRICTIONS: No  FALLS: Has patient fallen in last 6 months? No  LIVING ENVIRONMENT: Lives with: lives with their family Lives in: House/apartment Stairs: Yes: External: 38 steps; can reach both Has following equipment at home: Dan Humphreys - 2 wheeled, shower  seat  PLOF: Independent with basic ADLs and Independent with household mobility with device Works in schools as Runner, broadcasting/film/video PATIENT GOALS: improve walking  OBJECTIVE:     TODAY'S TREATMENT: 07/05/23 Activity Comments  Nustep L7 x 6 min LEs only Started on L4; pt continued to request increased resistance  HEP review with red TB: sitting HS curl TB 10x sitting LAQ TB 10x sidestepping TB 4x along counter Required review of HS curl as pt was not performing this. Provided corrective cueing for LAQ. Cueing to lift L LE higher with sidestepping   STS with L foot back 10x; 2nd set with red TB above knees to address valgus positioning 10x  Good corretion of form   Standing R/L wt shift at TM rail alt toe taps on step with 1 UE on rail Then, with quad tip cane    Gait training with quad tip cane x152ft CGA-min A; significant R in-toeing and narrow BOS; unsteady turns        HOME EXERCISE PROGRAM Last updated: 07/05/23 Access Code: [email or web address] URL: [email or web address] Date: 07/05/2023 Prepared by: [facility] - [facility]  Exercises - Seated Hamstring Curls with Resistance  - 1 x daily - 7 x weekly - 3 sets - 10 reps - Seated Knee Extension with Resistance  - 1 x daily - 7 x weekly - 3 sets - 10 reps - Side Stepping with Resistance at Ankles and Counter Support  - 1 x daily - 7 x weekly - 1-3 sets - 1-2 minute rounds hold - Staggered Sit-to-Stand  - 1 x daily -  5 x weekly - 2 sets - 10 reps - Standing Toe Taps  - 1 x daily - 5 x weekly - 2 sets - 10 reps    PATIENT EDUCATION: Education details: HEP update with edu for safety Person educated: Patient Education method: Explanation, Demonstration, Tactile cues, Verbal cues, and Handouts Education comprehension: verbalized understanding and returned demonstration     Below measures were taken at time of initial evaluation unless otherwise specified:   DIAGNOSTIC FINDINGS: surgical procedure  COGNITION: Overall cognitive status: Within functional limits for tasks assessed   SENSATION: WFL  COORDINATION: Impaired LLE due to weakness  EDEMA:  none  MUSCLE TONE: LLE: Flaccid dorsiflexors/toe extensors, plantarflexors. Left hamstrings   MUSCLE LENGTH: WNL, developing some tightness in left ankle dorsiflexion due to paralysis  DTRs:  NT  POSTURE: No Significant postural limitations  LOWER EXTREMITY ROM:     Active  Right Eval Left Eval  Hip flexion 120 100  Hip extension    Hip abduction    Hip adduction    Hip internal rotation    Hip external rotation    Knee flexion 120 120  Knee extension 0 0  Ankle dorsiflexion 20 0  Ankle plantarflexion 35 0  Ankle inversion    Ankle eversion     (Blank rows = not tested)  LLE limited due to weakness  LOWER EXTREMITY MMT:    MMT Right Eval Left Eval  Hip flexion 5 3+  Hip extension    Hip abduction 5 3  Hip adduction 5 5  Hip internal rotation    Hip external rotation    Knee flexion 5 3-  Knee extension 5 3+  Ankle dorsiflexion 5 0  Ankle plantarflexion 5 0  Ankle inversion 5 0  Ankle eversion 5 0  (Blank rows = not tested)  BED MOBILITY:  indep  TRANSFERS: Assistive device utilized: Environmental consultant -  2 wheeled  Sit to stand: Complete Independence and Modified independence Stand to sit: Complete Independence and Modified independence Chair to chair: Modified independence Floor:  NT  RAMP:  Level of Assistance:  SBA Assistive device utilized: Environmental consultant - 2 wheeled Ramp Comments:   CURB:  Level of Assistance: SBA and CGA Assistive device utilized: Environmental consultant - 2 wheeled Curb Comments:   STAIRS: Level of Assistance: Modified independence and SBA Stair Negotiation Technique: Step to Pattern with Bilateral Rails Number of Stairs: 12  Height of Stairs: 4-6"  Comments: pt negotiating 38 steps to her apartment daily  GAIT: Gait pattern: step to pattern Distance walked:  Assistive device utilized: Environmental consultant - 2 wheeled Level of assistance: Modified independence Comments: use of fixed left AFO  FUNCTIONAL TESTS:  5 times sit to stand: 25 sec Timed up and go (TUG): 30 sec w/ RW and left AFO Berg Balance Scale: 42/56 Gait speed: TBD  TODAY'S TREATMENT:                                                                                                                              DATE: 06/23/23    PATIENT EDUCATION: Education details: assessment details, HEP initiated Person educated: Patient Education method: Explanation and Handouts Education comprehension: needs further education  HOME EXERCISE PROGRAM: Access Code: [email or web address] URL: [email or web address] Date: 06/23/2023 Prepared by: Shary Decamp  Exercises - Seated Hamstring Curls with Resistance  - 1 x daily - 7 x weekly - 3 sets - 10 reps - Seated Knee Extension with Resistance  - 1 x daily - 7 x weekly - 3 sets - 10 reps - Side Stepping with Resistance at Ankles and Counter Support  - 1 x daily - 7 x weekly - 1-3 sets - 1-2 minute rounds hold  GOALS: Goals reviewed with patient? Yes  SHORT TERM GOALS: Target date: 07/21/2023    Patient will be independent in HEP to improve functional outcomes Baseline: Goal status: IN PROGRESS  2.  Demo improved BLE strength and dynamic balance as evidenced by 15 sec 5xSTS test Baseline: 25 sec Goal status: IN PROGRESS  3.  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  Demo ambulation speed 2.8 ft/sec to improve efficiency of community ambulation Baseline: TBD Goal status: IN PROGRESS  3.  Demo low risk for falls per score 50/56 Berg Balance Test Baseline: 42/56 Goal status: IN PROGRESS  4.  Demo low risk for falls per time 12 sec TUG test Baseline: 30 sec w/ RW Goal status: IN PROGRESS  5.  Demo modified  independent stair ambulation w/ reciprocal pattern to improve safety and efficiency with entering/exiting home environment Baseline: step-to pattern bilat HR Goal status: IN PROGRESS    ASSESSMENT:  CLINICAL IMPRESSION: Patient arrived to session without new complaints. Reviewed HEP and provided corrective cueing and demo for proper form. Worked on increased L LE use with STS and focus on lateral hip musculature activation to avoid valgus positioning. Weight shifting activities were performed well with use of cane, then initiated gait training with cane with good sequencing, however ankle and hip weakness causing narrow BOS and in-toeing which may increase risk of falls. Will need to continue working on this in future sessions. No complaints upon leaving.   OBJECTIVE IMPAIRMENTS: Abnormal gait, decreased activity tolerance, decreased balance, decreased coordination, decreased endurance, decreased knowledge of use of DME, decreased mobility, difficulty walking, decreased ROM, decreased strength, and impaired tone.   ACTIVITY LIMITATIONS: carrying, lifting, stairs, transfers, and locomotion level  PARTICIPATION LIMITATIONS: meal prep, cleaning, laundry,  driving, shopping, community activity, and occupation  PERSONAL FACTORS: Time since onset of injury/illness/exacerbation and 1 comorbidity: PMH  are also affecting patient's functional outcome.   REHAB POTENTIAL: Excellent  CLINICAL DECISION MAKING: Evolving/moderate complexity  EVALUATION COMPLEXITY: Moderate  PLAN:  PT FREQUENCY: 2x/week  PT DURATION: 8 weeks  PLANNED INTERVENTIONS: Therapeutic exercises, Therapeutic activity, Neuromuscular re-education, Balance training, Gait training, Patient/Family education, Self Care, Joint mobilization, Stair training, Vestibular training, Canalith repositioning, Orthotic/Fit training, DME instructions, Aquatic Therapy, Dry Needling, Electrical stimulation, Spinal mobilization, Cryotherapy, Moist heat, Taping, Ionotophoresis 4mg /ml Dexamethasone, and Manual therapy  PLAN FOR NEXT SESSION: gait with cane, gait speed, floor to stand transfer,  HEP review and additions      Anette Guarneri, PT, DPT 07/05/23 10:19 AM  [facility] [address], [address] [place], [place] [address] Phone # [phone] Fax # [phone]

## 2023-07-05 ENCOUNTER — Encounter: Payer: Self-pay | Admitting: Physical Therapy

## 2023-07-05 ENCOUNTER — Ambulatory Visit: Payer: Medicaid Other | Attending: Nurse Practitioner | Admitting: Physical Therapy

## 2023-07-05 DIAGNOSIS — R2681 Unsteadiness on feet: Secondary | ICD-10-CM | POA: Diagnosis present

## 2023-07-05 DIAGNOSIS — M6281 Muscle weakness (generalized): Secondary | ICD-10-CM | POA: Diagnosis present

## 2023-07-05 DIAGNOSIS — R2689 Other abnormalities of gait and mobility: Secondary | ICD-10-CM | POA: Insufficient documentation

## 2023-07-05 DIAGNOSIS — R262 Difficulty in walking, not elsewhere classified: Secondary | ICD-10-CM | POA: Insufficient documentation

## 2023-07-07 ENCOUNTER — Encounter: Payer: Self-pay | Admitting: Physical Therapy

## 2023-07-07 ENCOUNTER — Ambulatory Visit: Payer: Medicaid Other | Admitting: Physical Therapy

## 2023-07-07 DIAGNOSIS — M6281 Muscle weakness (generalized): Secondary | ICD-10-CM

## 2023-07-07 DIAGNOSIS — R262 Difficulty in walking, not elsewhere classified: Secondary | ICD-10-CM | POA: Diagnosis not present

## 2023-07-07 DIAGNOSIS — R2681 Unsteadiness on feet: Secondary | ICD-10-CM

## 2023-07-07 DIAGNOSIS — R2689 Other abnormalities of gait and mobility: Secondary | ICD-10-CM

## 2023-07-07 NOTE — Therapy (Signed)
OUTPATIENT PHYSICAL THERAPY NEURO TREATMENT   Patient Name: Alisha Thomas MRN: [id number] DOB:1971-03-12, 52 y.o., female Today's Date: 07/07/2023   PCP: Jordan Hawks, PA-C REFERRING PROVIDER: Carrolyn Leigh, FNP  END OF SESSION:  PT End of Session - 07/07/23 1305     Visit Number 4    Number of Visits 17    Date for PT Re-Evaluation 08/18/23    Authorization Type BC[employer]    PT Start Time 1312    PT Stop Time 1407    PT Time Calculation (min) 55 min    Equipment Utilized During Treatment Gait belt    Activity Tolerance Patient tolerated treatment well    Behavior During Therapy WFL for tasks assessed/performed               Past Medical History:  Diagnosis Date   Hypertension    Ovarian cyst    Pelvic adhesions    Stomach ulcer    Past Surgical History:  Procedure Laterality Date   BREAST CYST ASPIRATION Left 2021   ECTOPIC PREGNANCY SURGERY     OVARIAN CYST SURGERY     PELVIC LAPAROSCOPY  11/30/2006   Diag Lap lysis of adhesions   Patient Active Problem List   Diagnosis Date Noted   Symptomatic anemia 03/26/2020   Iron deficiency anemia due to chronic blood loss 02/24/2018   Fibroids 07/04/2015   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   Peptic ulcer 09/10/2011   Ovarian cyst    Pelvic adhesions     ONSET DATE: 04/22/23  REFERRING DIAG: Z74.09 (ICD-10-CM) - Other reduced mobility Z78.9 (ICD-10-CM) - Other specified health status 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  I do that sometimes at home.    Pt accompanied by: friend  PERTINENT HISTORY: hx of meningoma   PAIN:  Are you having pain? No  PRECAUTIONS: Fall  RED FLAGS: None   WEIGHT BEARING RESTRICTIONS: No  FALLS: Has patient fallen in last 6 months? No  LIVING ENVIRONMENT: Lives with: lives with their family Lives in: House/apartment Stairs: Yes: External: 38 steps; can  reach both Has following equipment at home: Dan Humphreys - 2 wheeled, shower seat  PLOF: Independent with basic ADLs and Independent with household mobility with device Works in schools as Runner, broadcasting/film/video PATIENT GOALS: improve walking  OBJECTIVE:    TODAY'S TREATMENT: 07/07/2023 Activity Comments  MMT:  L ankle dorsiflexion trace Plantarflexion 2/5 Inversion 3 Eversion trace   Gait training with RW, no AFO 2 x 40 ft Pt with steppage gait LLE, decreased heelstrike, decreased dorsiflexion; no recurvatum noted at L knee  Sit to stand x 10, RLE posterior position No AFO  Minisquats x 10 No AFO  Bridging 2 x 10 Single limb bridging 2 x 10   Gastroc stretch-runner's stretch position 3 x 10-15 sec   LLE forward step ups (single leg) 2 x 10 BUE support  Stair negotiation-3 trials: 1 trial step to pattern 2 trials step through pattern BUE support  Gait training with SBQC 50 ft x 2  Min guard and cues for cane sequence/postioning  NuStep, Level 6, BLEs x 7 minutes Cues to keep L knee neutral to avoid hip internal rotation position   Access Code: [email or web address] URL: [email or web address] Date: 07/07/2023 Prepared by: [facility] - Outpatient  Rehab - [facility]  Exercises - Seated Hamstring Curls with Resistance  - 1 x daily - 7 x weekly - 3 sets - 10 reps - Seated Knee Extension with Resistance  - 1 x daily - 7 x weekly - 3 sets - 10 reps - Side Stepping with Resistance at Ankles and Counter Support  - 1 x daily - 7 x weekly - 1-3 sets -  1-2 minute rounds hold - Staggered Sit-to-Stand  - 1 x daily - 5 x weekly - 2 sets - 10 reps - Standing Toe Taps  - 1 x daily - 5 x weekly - 2 sets - 10 reps - Standing Gastroc Stretch at Counter  - 1 x daily - 7 x weekly - 1 sets - 3 reps - 30 sec hold - Single Leg Bridge  - 1 x daily - 5 x weekly - 2 sets - 10 reps - 3 sec hold - Mini Squat  - 1 x daily - 5 x weekly - 2 sets - 10 reps   PATIENT EDUCATION: Education details: HEP update; discussed benefit to continuing to wear AFO at home for safety and for optimal gait mechanics to avoid compensations.  Discussed performing weightbearing exercises (bridging, sit<>stand, minisquats without AFO is okay) Person educated: Patient Education method: Explanation, Demonstration, Tactile cues, Verbal cues, and Handouts Education comprehension: verbalized understanding and returned demonstration     Below measures were taken at time of initial evaluation unless otherwise specified:   DIAGNOSTIC FINDINGS: surgical procedure  COGNITION: Overall cognitive status: Within functional limits for tasks assessed   SENSATION: WFL  COORDINATION: Impaired LLE due to weakness  EDEMA:  none  MUSCLE TONE: LLE: Flaccid dorsiflexors/toe extensors, plantarflexors. Left hamstrings   MUSCLE LENGTH: WNL, developing some tightness in left ankle dorsiflexion due to paralysis  DTRs:  NT  POSTURE: No Significant postural limitations  LOWER EXTREMITY ROM:     Active  Right Eval Left Eval  Hip flexion 120 100  Hip extension    Hip abduction    Hip adduction    Hip internal rotation    Hip external rotation    Knee flexion 120 120  Knee extension 0 0  Ankle dorsiflexion 20 0  Ankle plantarflexion 35 0  Ankle inversion    Ankle eversion     (  Blank rows = not tested)  LLE limited due to weakness  LOWER EXTREMITY MMT:    MMT Right Eval Left Eval  Hip flexion 5 3+  Hip extension    Hip abduction 5 3  Hip adduction 5 5  Hip internal  rotation    Hip external rotation    Knee flexion 5 3-  Knee extension 5 3+  Ankle dorsiflexion 5 0  Ankle plantarflexion 5 0  Ankle inversion 5 0  Ankle eversion 5 0  (Blank rows = not tested)  BED MOBILITY:  indep  TRANSFERS: Assistive device utilized: Environmental consultant - 2 wheeled  Sit to stand: Complete Independence and Modified independence Stand to sit: Complete Independence and Modified independence Chair to chair: Modified independence Floor:  NT  RAMP:  Level of Assistance: SBA Assistive device utilized: Environmental consultant - 2 wheeled Ramp Comments:   CURB:  Level of Assistance: SBA and CGA Assistive device utilized: Environmental consultant - 2 wheeled Curb Comments:   STAIRS: Level of Assistance: Modified independence and SBA Stair Negotiation Technique: Step to Pattern with Bilateral Rails Number of Stairs: 12  Height of Stairs: 4-6"  Comments: pt negotiating 38 steps to her apartment daily  GAIT: Gait pattern: step to pattern Distance walked:  Assistive device utilized: Environmental consultant - 2 wheeled Level of assistance: Modified independence Comments: use of fixed left AFO  FUNCTIONAL TESTS:  5 times sit to stand: 25 sec Timed up and go (TUG): 30 sec w/ RW and left AFO Berg Balance Scale: 42/56 Gait speed: TBD  TODAY'S TREATMENT:                                                                                                                              DATE: 06/23/23    PATIENT EDUCATION: Education details: assessment details, HEP initiated Person educated: Patient Education method: Chief Technology Officer Education comprehension: needs further education  HOME EXERCISE PROGRAM: Access Code: [email or web address] URL: [email or web address] Date: 06/23/2023 Prepared by: Shary Decamp  Exercises - Seated Hamstring Curls with Resistance  - 1 x daily - 7 x weekly - 3 sets - 10 reps - Seated Knee Extension with Resistance  - 1 x daily - 7 x weekly - 3 sets - 10 reps - Side Stepping with  Resistance at Ankles and Counter Support  - 1 x daily - 7 x weekly - 1-3 sets - 1-2 minute rounds hold  GOALS: Goals reviewed with patient? Yes  SHORT TERM GOALS: Target date: 07/21/2023    Patient will be independent in HEP to improve functional outcomes Baseline: Goal status: IN PROGRESS  2.  Demo improved BLE strength and dynamic balance as evidenced by 15 sec 5xSTS test Baseline: 25 sec Goal status: IN PROGRESS  3.  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  Demo ambulation speed 2.8 ft/sec to improve efficiency of community ambulation Baseline: TBD Goal status: IN PROGRESS  3.  Demo low risk for falls per score 50/56 Berg Balance Test Baseline: 42/56 Goal status: IN PROGRESS  4.  Demo low risk for falls per time 12 sec TUG test Baseline: 30 sec w/ RW Goal status: IN PROGRESS  5.  Demo modified independent stair ambulation w/ reciprocal pattern to improve safety and efficiency with entering/exiting home environment Baseline: step-to pattern bilat HR Goal status: IN PROGRESS    ASSESSMENT:  CLINICAL IMPRESSION: Pt arrived to session today and asked if it is okay to walk at home without AFO, as she has been trying this.  Looked at MMT today, with some trace ankle dorsiflexion and eversion as well as inversion and plantarflexion (stronger).  Trialed gait without L AFO and pt has more steppage gait, no heelstrike and no dorsiflexion, slightly more inversion.  Discussed with patient that she should continue to use AFO with gait even at home, to prevent potential  for falls and keep optimal gait mechanics to prevent gait compensations.  Worked on LLE strengthening (some weightbearing without AFO) and standing exercises with AFO.  She will continue to benefit from skilled PT towards goals for improved functional mobility and decreased fall risk. OBJECTIVE IMPAIRMENTS: Abnormal gait, decreased activity tolerance, decreased balance, decreased coordination, decreased endurance, decreased knowledge of use of DME, decreased mobility, difficulty walking, decreased ROM, decreased strength, and impaired tone.   ACTIVITY LIMITATIONS: carrying, lifting, stairs, transfers, and locomotion level  PARTICIPATION LIMITATIONS: meal prep, cleaning, laundry, driving, shopping, community activity, and occupation  PERSONAL FACTORS: Time since onset of injury/illness/exacerbation and 1 comorbidity: PMH  are also affecting patient's functional outcome.   REHAB POTENTIAL: Excellent  CLINICAL DECISION MAKING: Evolving/moderate complexity  EVALUATION COMPLEXITY: Moderate  PLAN:  PT FREQUENCY: 2x/week  PT DURATION: 8 weeks  PLANNED INTERVENTIONS: Therapeutic exercises, Therapeutic activity, Neuromuscular re-education, Balance training, Gait training, Patient/Family education, Self Care, Joint mobilization, Stair training, Vestibular training, Canalith repositioning, Orthotic/Fit training, DME instructions, Aquatic Therapy, Dry Needling, Electrical stimulation, Spinal mobilization, Cryotherapy, Moist heat, Taping, Ionotophoresis 4mg /ml Dexamethasone, and Manual therapy  PLAN FOR NEXT SESSION: gait with cane (try small rubber tip quad cane), continue hip and ankle strengthening LLE, floor to stand transfer,  HEP review of additions provided today      Lonia Blood, PT 07/07/23 2:01 PM Phone: [phone] Fax: [phone]  [facility] at [facility] [address] [place], [place] [address] Phone # [phone] Fax # [phone]

## 2023-07-12 ENCOUNTER — Encounter: Payer: Self-pay | Admitting: Physical Therapy

## 2023-07-12 ENCOUNTER — Ambulatory Visit: Payer: Medicaid Other | Admitting: Physical Therapy

## 2023-07-12 DIAGNOSIS — M6281 Muscle weakness (generalized): Secondary | ICD-10-CM

## 2023-07-12 DIAGNOSIS — R2689 Other abnormalities of gait and mobility: Secondary | ICD-10-CM

## 2023-07-12 DIAGNOSIS — R2681 Unsteadiness on feet: Secondary | ICD-10-CM

## 2023-07-12 DIAGNOSIS — R262 Difficulty in walking, not elsewhere classified: Secondary | ICD-10-CM | POA: Diagnosis not present

## 2023-07-12 NOTE — Therapy (Signed)
OUTPATIENT PHYSICAL THERAPY NEURO TREATMENT   Patient Name: Alisha Thomas MRN: [id number] DOB:August 09, 1971, 52 y.o., female Today's Date: 07/12/2023   PCP: Jordan Hawks, PA-C REFERRING PROVIDER: Carrolyn Leigh, FNP  END OF SESSION:  PT End of Session - 07/12/23 1322     Visit Number 5    Number of Visits 17    Date for PT Re-Evaluation 08/18/23    Authorization Type [employer] Medicaid-auth not required    Authorization - Visit Number 4    Authorization - Number of Visits 27   combined (but not getting OT)   PT Start Time 1320    PT Stop Time 1400    PT Time Calculation (min) 40 min    Equipment Utilized During Treatment Gait belt    Activity Tolerance Patient tolerated treatment well    Behavior During Therapy WFL for tasks assessed/performed                Past Medical History:  Diagnosis Date   Hypertension    Ovarian cyst    Pelvic adhesions    Stomach ulcer    Past Surgical History:  Procedure Laterality Date   BREAST CYST ASPIRATION Left 2021   ECTOPIC PREGNANCY SURGERY     OVARIAN CYST SURGERY     PELVIC LAPAROSCOPY  11/30/2006   Diag Lap lysis of adhesions   Patient Active Problem List   Diagnosis Date Noted   Symptomatic anemia 03/26/2020   Iron deficiency anemia due to chronic blood loss 02/24/2018   Fibroids 07/04/2015   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   Peptic ulcer 09/10/2011   Ovarian cyst    Pelvic adhesions     ONSET DATE: 04/22/23  REFERRING DIAG: Z74.09 (ICD-10-CM) - Other reduced mobility Z78.9 (ICD-10-CM) - Other specified health status 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 Pt accompanied by: friend  PERTINENT HISTORY: hx of meningoma   PAIN:  Are you having pain? No  PRECAUTIONS: Fall  RED FLAGS: None   WEIGHT BEARING RESTRICTIONS: No  FALLS: Has patient fallen in last 6 months? No  LIVING ENVIRONMENT: Lives with: lives with their family Lives  in: House/apartment Stairs: Yes: External: 38 steps; can reach both Has following equipment at home: Walker - 2 wheeled, shower seat  PLOF: Independent with basic ADLs and Independent with household mobility with device Works in schools as Runner, broadcasting/film/video PATIENT GOALS: improve walking  OBJECTIVE:    TODAY'S TREATMENT: 07/12/2023 Activity Comments  Gait training with SPC with small rubber tip quad cane 85 ft x 2 reps -progressed to gait 50 ft x 4 reps with cues to look ahead  -gait with head turns (min guard x 75 ft) -short distance gait between activities in session with cane, decreased R step length at times Occasional cues for sequence and increased RLE step length  Stair negotiation, 10 reps in clinic steps, bilat rails and step through pattern   Standing feet apart with EO head turns and head nods, then EC head turns/nods Mild sway  Partial tandem stance EO head turns/nods x 5 reps Mild sway  Vitals 134/86, HR 86  Per patient request  NuStep, Level 6, BLEs only x 6 minutes For strengthening  Access Code: [email or web address] URL: [email or web address] Date: 07/12/2023 Prepared by: [facility] - Outpatient  Rehab - [facility]  Exercises - Seated Hamstring Curls with Resistance  - 1 x daily - 7 x weekly - 3 sets - 10 reps - Seated Knee Extension with Resistance  - 1 x daily - 7 x weekly - 3 sets - 10 reps - Side Stepping with Resistance at Ankles and Counter Support  - 1 x daily - 7 x weekly - 1-3 sets - 1-2 minute rounds hold - Staggered  Sit-to-Stand  - 1 x daily - 5 x weekly - 2 sets - 10 reps - Standing Toe Taps  - 1 x daily - 5 x weekly - 2 sets - 10 reps - Standing Gastroc Stretch at Counter  - 1 x daily - 7 x weekly - 1 sets - 3 reps - 30 sec hold - Single Leg Bridge  - 1 x daily - 5 x weekly - 2 sets - 10 reps - 3 sec hold - Mini Squat  - 1 x daily - 5 x weekly - 2 sets - 10 reps - Narrow Stance with Counter Support  - 1 x daily - 7 x weekly - 1 sets - 10 reps     PATIENT EDUCATION: Education details: HEP update Discussed performing weightbearing exercises (bridging, sit<>stand, minisquats without AFO is okay) Person educated: Patient Education method: Explanation, Demonstration, Tactile cues, Verbal cues, and Handouts Education comprehension: verbalized understanding and returned demonstration     Below measures were taken at time of initial evaluation unless otherwise specified:   DIAGNOSTIC FINDINGS: surgical procedure  COGNITION: Overall cognitive status: Within functional limits for tasks assessed   SENSATION: WFL  COORDINATION: Impaired LLE due to weakness  EDEMA:  none  MUSCLE TONE: LLE: Flaccid dorsiflexors/toe extensors, plantarflexors. Left hamstrings   MUSCLE LENGTH: WNL, developing some tightness in left ankle dorsiflexion due to paralysis  DTRs:  NT  POSTURE: No Significant postural limitations  LOWER EXTREMITY ROM:     Active  Right Eval Left Eval  Hip flexion 120 100  Hip extension    Hip abduction    Hip adduction    Hip internal rotation    Hip external rotation    Knee flexion 120 120  Knee extension 0 0  Ankle dorsiflexion 20 0  Ankle plantarflexion 35 0  Ankle inversion    Ankle eversion     (Blank rows =  not tested)  LLE limited due to weakness  LOWER EXTREMITY MMT:    MMT Right Eval Left Eval  Hip flexion 5 3+  Hip extension    Hip abduction 5 3  Hip adduction 5 5  Hip internal rotation    Hip external rotation    Knee flexion 5 3-  Knee  extension 5 3+  Ankle dorsiflexion 5 0  Ankle plantarflexion 5 0  Ankle inversion 5 0  Ankle eversion 5 0  (Blank rows = not tested)  BED MOBILITY:  indep  TRANSFERS: Assistive device utilized: Environmental consultant - 2 wheeled  Sit to stand: Complete Independence and Modified independence Stand to sit: Complete Independence and Modified independence Chair to chair: Modified independence Floor:  NT  RAMP:  Level of Assistance: SBA Assistive device utilized: Environmental consultant - 2 wheeled Ramp Comments:   CURB:  Level of Assistance: SBA and CGA Assistive device utilized: Environmental consultant - 2 wheeled Curb Comments:   STAIRS: Level of Assistance: Modified independence and SBA Stair Negotiation Technique: Step to Pattern with Bilateral Rails Number of Stairs: 12  Height of Stairs: 4-6"  Comments: pt negotiating 38 steps to her apartment daily  GAIT: Gait pattern: step to pattern Distance walked:  Assistive device utilized: Environmental consultant - 2 wheeled Level of assistance: Modified independence Comments: use of fixed left AFO  FUNCTIONAL TESTS:  5 times sit to stand: 25 sec Timed up and go (TUG): 30 sec w/ RW and left AFO Berg Balance Scale: 42/56 Gait speed: TBD  TODAY'S TREATMENT:                                                                                                                              DATE: 06/23/23    PATIENT EDUCATION: Education details: assessment details, HEP initiated Person educated: Patient Education method: Chief Technology Officer Education comprehension: needs further education  HOME EXERCISE PROGRAM: Access Code: [email or web address] URL: [email or web address] Date: 06/23/2023 Prepared by: Shary Decamp  Exercises - Seated Hamstring Curls with Resistance  - 1 x daily - 7 x weekly - 3 sets - 10 reps - Seated Knee Extension with Resistance  - 1 x daily - 7 x weekly - 3 sets - 10 reps - Side Stepping with Resistance at Ankles and Counter Support  - 1 x daily - 7 x weekly -  1-3 sets - 1-2 minute rounds hold  GOALS: Goals reviewed with patient? Yes  SHORT TERM GOALS: Target date: 07/21/2023    Patient will be independent in HEP to improve functional outcomes Baseline: Goal status: IN PROGRESS  2.  Demo improved BLE strength and dynamic balance as evidenced by 15 sec 5xSTS test Baseline: 25 sec Goal status: IN PROGRESS  3.  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  Demo ambulation speed 2.8 ft/sec to improve efficiency of community ambulation Baseline: TBD Goal status: IN PROGRESS  3.  Demo low risk for falls per score 50/56 Berg Balance Test Baseline: 42/56 Goal status: IN PROGRESS  4.  Demo low risk for falls per time 12 sec TUG test Baseline: 30 sec w/ RW Goal status: IN PROGRESS  5.  Demo modified independent stair ambulation w/ reciprocal pattern to improve safety and efficiency with entering/exiting home environment Baseline: step-to pattern bilat HR Goal status: IN PROGRESS    ASSESSMENT:  CLINICAL IMPRESSION: Skilled PT session focused on gait training and balance.  Gait training performed with straight cane with small rubber quad tip base.  Pt continues to have bilateral toe-in with gait, but does say this is pre-morbid gait pattern.  She does quite well with min guard with use of small rubber quad tip cane, and this likely would be better option than standard SBQC.  She does have some hesitancy initially with step through pattern, but improves with repetition.  She is able to negotiate steps with step through pattern with  Bilat handrails, reports doing this at home.  She is making steady improvements towards goals. OBJECTIVE IMPAIRMENTS: Abnormal gait, decreased activity tolerance, decreased balance, decreased coordination, decreased endurance, decreased knowledge of use of DME, decreased mobility, difficulty walking, decreased ROM, decreased strength, and impaired tone.   ACTIVITY LIMITATIONS: carrying, lifting, stairs, transfers, and locomotion level  PARTICIPATION LIMITATIONS: meal prep, cleaning, laundry, driving, shopping, community activity, and occupation  PERSONAL FACTORS: Time since onset of injury/illness/exacerbation and 1 comorbidity: PMH  are also affecting patient's functional outcome.   REHAB POTENTIAL: Excellent  CLINICAL DECISION MAKING: Evolving/moderate complexity  EVALUATION COMPLEXITY: Moderate  PLAN:  PT FREQUENCY: 2x/week  PT DURATION: 8 weeks  PLANNED INTERVENTIONS: Therapeutic exercises, Therapeutic activity, Neuromuscular re-education, Balance training, Gait training, Patient/Family education, Self Care, Joint mobilization, Stair training, Vestibular training, Canalith repositioning, Orthotic/Fit training, DME instructions, Aquatic Therapy, Dry Needling, Electrical stimulation, Spinal mobilization, Cryotherapy, Moist heat, Taping, Ionotophoresis 4mg /ml Dexamethasone, and Manual therapy  PLAN FOR NEXT SESSION: Continue gait with cane, (try treadmill for continuous gait pattern ?)continue hip and ankle strengthening LLE, floor to stand transfer,  HEP review of additions provided in past 1-2 sessions; check STGs next week      Lonia Blood, PT 07/12/23 2:32 PM Phone: [phone] Fax: [phone]  [facility] at [facility] [address], [address] [place], [place] [address] Phone # [phone] Fax # [phone]

## 2023-07-14 ENCOUNTER — Ambulatory Visit: Payer: Medicaid Other

## 2023-07-14 DIAGNOSIS — M6281 Muscle weakness (generalized): Secondary | ICD-10-CM

## 2023-07-14 DIAGNOSIS — R2681 Unsteadiness on feet: Secondary | ICD-10-CM

## 2023-07-14 DIAGNOSIS — R262 Difficulty in walking, not elsewhere classified: Secondary | ICD-10-CM | POA: Diagnosis not present

## 2023-07-14 DIAGNOSIS — R2689 Other abnormalities of gait and mobility: Secondary | ICD-10-CM

## 2023-07-14 NOTE — Therapy (Signed)
OUTPATIENT PHYSICAL THERAPY NEURO TREATMENT   Patient Name: Alisha Thomas MRN: [id number] DOB:July 24, 1971, 52 y.o., female Today's Date: 07/14/2023   PCP: Jordan Hawks, PA-C REFERRING PROVIDER: Carrolyn Leigh, FNP  END OF SESSION:  PT End of Session - 07/14/23 1315     Visit Number 6    Number of Visits 17    Date for PT Re-Evaluation 08/18/23    Authorization Type [employer] Medicaid-auth not required    Authorization - Visit Number 6    Authorization - Number of Visits 27   combined (but not getting OT)   PT Start Time 1315    PT Stop Time 1400    PT Time Calculation (min) 45 min    Equipment Utilized During Treatment Gait belt    Activity Tolerance Patient tolerated treatment well    Behavior During Therapy WFL for tasks assessed/performed                Past Medical History:  Diagnosis Date   Hypertension    Ovarian cyst    Pelvic adhesions    Stomach ulcer    Past Surgical History:  Procedure Laterality Date   BREAST CYST ASPIRATION Left 2021   ECTOPIC PREGNANCY SURGERY     OVARIAN CYST SURGERY     PELVIC LAPAROSCOPY  11/30/2006   Diag Lap lysis of adhesions   Patient Active Problem List   Diagnosis Date Noted   Symptomatic anemia 03/26/2020   Iron deficiency anemia due to chronic blood loss 02/24/2018   Fibroids 07/04/2015   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   Peptic ulcer 09/10/2011   Ovarian cyst    Pelvic adhesions     ONSET DATE: 04/22/23  REFERRING DIAG: Z74.09 (ICD-10-CM) - Other reduced mobility Z78.9 (ICD-10-CM) - Other specified health status 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.0 (ICD-10-CM) - Benign neoplasm of cerebral meninges Z98.890 (ICD-10-CM) - Other specified postprocedural states  THERAPY DIAG:  Muscle weakness (generalized)  Other abnormalities of gait and mobility  Unsteadiness on feet  Difficulty in walking, not elsewhere classified  Rationale for Evaluation and Treatment: Rehabilitation  SUBJECTIVE:                                                                                                                                                                                              SUBJECTIVE STATEMENT: Got a cane but need to adjust it to the proper height (has with her) Pt accompanied by: friend  PERTINENT HISTORY: hx of meningoma   PAIN:  Are you having pain? No  PRECAUTIONS: Fall  RED FLAGS: None   WEIGHT BEARING RESTRICTIONS: No  FALLS: Has patient fallen in last  6 months? No  LIVING ENVIRONMENT: Lives with: lives with their family Lives in: House/apartment Stairs: Yes: External: 38 steps; can reach both Has following equipment at home: Walker - 2 wheeled, shower seat  PLOF: Independent with basic ADLs and Independent with household mobility with device Works in schools as Runner, broadcasting/film/video PATIENT GOALS: improve walking  OBJECTIVE:   TODAY'S TREATMENT: 07/14/23 Activity Comments  NMES trials for foot drop Unable to elicit contraction of DF with devices/clinic unit  Gait training Use of cane with quad tip, cues in step length. Stair ambulation with cane and step to pattern  Seated hamstring curls 3x10 10# cable               TODAY'S TREATMENT: 07/12/2023 Activity Comments  Gait training with SPC with small rubber tip quad cane 85 ft x 2 reps -progressed to gait 50 ft x 4 reps with cues to look ahead  -gait with head turns (min guard x 75 ft) -short distance gait between activities in session with cane, decreased R step length at times Occasional cues for sequence and increased RLE step length  Stair negotiation, 10 reps in clinic steps, bilat rails and step through pattern   Standing feet apart with EO head turns and head nods, then EC head turns/nods Mild sway  Partial tandem stance EO head turns/nods x 5 reps Mild sway  Vitals 134/86, HR 86  Per patient request  NuStep, Level 6, BLEs only x 6 minutes For strengthening  Access Code: [email or web address] URL: [email or web address] Date: 07/12/2023 Prepared by:  [facility] - Outpatient  Rehab - [facility]  Exercises - Seated Hamstring Curls with Resistance  - 1 x daily - 7 x weekly - 3 sets - 10 reps - Seated Knee Extension with Resistance  - 1 x daily - 7 x weekly - 3 sets - 10 reps - Side Stepping with Resistance at Ankles and Counter Support  - 1 x daily - 7 x weekly - 1-3 sets - 1-2 minute rounds hold - Staggered Sit-to-Stand  - 1 x daily - 5 x weekly - 2 sets - 10 reps - Standing Toe Taps  - 1 x daily - 5 x weekly - 2 sets - 10 reps - Standing Gastroc Stretch at Counter  - 1 x daily - 7 x weekly - 1 sets - 3 reps - 30 sec hold - Single Leg Bridge  - 1 x daily - 5 x weekly - 2 sets - 10 reps - 3 sec hold - Mini Squat  - 1 x daily - 5 x weekly - 2 sets - 10 reps - Narrow Stance with Counter Support  - 1 x daily - 7 x weekly - 1 sets - 10 reps     PATIENT EDUCATION: Education details: HEP update Discussed performing weightbearing exercises (bridging, sit<>stand, minisquats without AFO is okay) Person educated: Patient Education method: Explanation, Demonstration, Tactile cues, Verbal cues, and Handouts Education comprehension: verbalized understanding and returned demonstration     Below measures were taken at time of initial evaluation unless otherwise specified:   DIAGNOSTIC FINDINGS: surgical procedure  COGNITION: Overall cognitive status: Within functional limits for tasks assessed   SENSATION: WFL  COORDINATION: Impaired LLE due to weakness  EDEMA:  none  MUSCLE TONE: LLE: Flaccid dorsiflexors/toe extensors, plantarflexors. Left hamstrings   MUSCLE LENGTH: WNL, developing some tightness in left ankle dorsiflexion due to paralysis  DTRs:  NT  POSTURE: No Significant postural limitations  LOWER EXTREMITY ROM:  Active  Right Eval Left Eval  Hip flexion 120 100  Hip extension    Hip abduction    Hip adduction    Hip internal rotation    Hip external rotation    Knee flexion 120 120  Knee extension 0  0  Ankle dorsiflexion 20 0  Ankle plantarflexion 35 0  Ankle inversion    Ankle eversion     (Blank rows = not tested)  LLE limited due to weakness  LOWER EXTREMITY MMT:    MMT Right Eval Left Eval  Hip flexion 5 3+  Hip extension    Hip abduction 5 3  Hip adduction 5 5  Hip internal rotation    Hip external rotation    Knee flexion 5 3-  Knee extension 5 3+  Ankle dorsiflexion 5 0  Ankle plantarflexion 5 0  Ankle inversion 5 0  Ankle eversion 5 0  (Blank rows = not tested)  BED MOBILITY:  indep  TRANSFERS: Assistive device utilized: Environmental consultant - 2 wheeled  Sit to stand: Complete Independence and Modified independence Stand to sit: Complete Independence and Modified independence Chair to chair: Modified independence Floor:  NT  RAMP:  Level of Assistance: SBA Assistive device utilized: Environmental consultant - 2 wheeled Ramp Comments:   CURB:  Level of Assistance: SBA and CGA Assistive device utilized: Environmental consultant - 2 wheeled Curb Comments:   STAIRS: Level of Assistance: Modified independence and SBA Stair Negotiation Technique: Step to Pattern with Bilateral Rails Number of Stairs: 12  Height of Stairs: 4-6"  Comments: pt negotiating 38 steps to her apartment daily  GAIT: Gait pattern: step to pattern Distance walked:  Assistive device utilized: Environmental consultant - 2 wheeled Level of assistance: Modified independence Comments: use of fixed left AFO  FUNCTIONAL TESTS:  5 times sit to stand: 25 sec Timed up and go (TUG): 30 sec w/ RW and left AFO Berg Balance Scale: 42/56 Gait speed: TBD  TODAY'S TREATMENT:                                                                                                                              DATE: 06/23/23    PATIENT EDUCATION: Education details: assessment details, HEP initiated Person educated: Patient Education method: Chief Technology Officer Education comprehension: needs further education  HOME EXERCISE PROGRAM: Access Code:  [email or web address] URL: [email or web address] Date: 06/23/2023 Prepared by: Shary Decamp  Exercises - Seated Hamstring Curls with Resistance  - 1 x daily - 7 x weekly - 3 sets - 10 reps - Seated Knee Extension with Resistance  - 1 x daily - 7 x weekly - 3 sets - 10 reps - Side Stepping with Resistance at Ankles and Counter Support  - 1 x daily - 7 x weekly - 1-3 sets - 1-2 minute rounds hold  GOALS: Goals reviewed with patient? Yes  SHORT TERM GOALS: Target date: 07/21/2023    Patient will be independent in HEP to improve functional outcomes  Baseline: Goal status: IN PROGRESS  2.  Demo improved BLE strength and dynamic balance as evidenced by 15 sec 5xSTS test Baseline: 25 sec Goal status: IN PROGRESS  3.  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  Demo ambulation speed 2.8 ft/sec to improve efficiency of community ambulation Baseline: TBD Goal status: IN PROGRESS  3.  Demo low risk for falls per score 50/56 Berg Balance Test Baseline: 42/56 Goal status: IN PROGRESS  4.  Demo low risk for falls per time 12 sec TUG test Baseline: 30 sec w/ RW Goal status: IN PROGRESS  5.  Demo modified independent stair ambulation w/ reciprocal pattern to improve safety and efficiency with entering/exiting home environment Baseline: step-to pattern bilat HR Goal status: IN PROGRESS    ASSESSMENT:  CLINICAL IMPRESSION: Pt demonstrates trace to 2-/5 left ankle plantarflexion, 0/5 dorsiflexion.  Attempted use of NMES units to elicit contraction to dorsiflexion for neuro  re-ed but unable to achieve.  Gait training initiated with cane and quad tip cane with good stability but uneven step length. Stairs accomplished with supervision and cane with step-to pattern. Ended with seated hamstring curls for strength/motor control.  Manual muscle testing reveals overall improved LLE strength except for ankle DF and toe extensors OBJECTIVE IMPAIRMENTS: Abnormal gait, decreased activity tolerance, decreased balance, decreased coordination, decreased endurance, decreased knowledge of use of DME, decreased mobility, difficulty walking, decreased ROM, decreased strength, and impaired tone.   ACTIVITY LIMITATIONS: carrying, lifting, stairs, transfers, and locomotion level  PARTICIPATION LIMITATIONS: meal prep, cleaning, laundry, driving, shopping, community activity, and occupation  PERSONAL FACTORS: Time since onset of injury/illness/exacerbation and 1 comorbidity: PMH  are also affecting patient's functional outcome.   REHAB POTENTIAL: Excellent  CLINICAL DECISION MAKING: Evolving/moderate complexity  EVALUATION COMPLEXITY: Moderate  PLAN:  PT FREQUENCY: 2x/week  PT DURATION: 8 weeks  PLANNED INTERVENTIONS: Therapeutic exercises, Therapeutic activity, Neuromuscular re-education, Balance training, Gait training, Patient/Family education, Self Care, Joint mobilization, Stair training, Vestibular training, Canalith repositioning, Orthotic/Fit training, DME instructions, Aquatic Therapy, Dry Needling, Electrical stimulation, Spinal mobilization, Cryotherapy, Moist heat, Taping, Ionotophoresis 4mg /ml Dexamethasone, and Manual therapy  PLAN FOR NEXT SESSION: Continue gait with cane, (try treadmill for continuous gait pattern ?)continue hip and ankle strengthening LLE, floor to stand transfer,  HEP review of additions provided in past 1-2 sessions; check STGs next week     2:11 PM, 07/14/23 M. Shary Decamp, PT, DPT Physical Therapist- [place] Office Number: [phone]

## 2023-07-19 ENCOUNTER — Ambulatory Visit: Payer: Medicaid Other | Admitting: Physical Therapy

## 2023-07-19 ENCOUNTER — Encounter: Payer: Self-pay | Admitting: Physical Therapy

## 2023-07-19 DIAGNOSIS — R262 Difficulty in walking, not elsewhere classified: Secondary | ICD-10-CM | POA: Diagnosis not present

## 2023-07-19 DIAGNOSIS — R2689 Other abnormalities of gait and mobility: Secondary | ICD-10-CM

## 2023-07-19 DIAGNOSIS — R2681 Unsteadiness on feet: Secondary | ICD-10-CM

## 2023-07-19 DIAGNOSIS — M6281 Muscle weakness (generalized): Secondary | ICD-10-CM

## 2023-07-19 NOTE — Therapy (Signed)
OUTPATIENT PHYSICAL THERAPY NEURO TREATMENT   Patient Name: Alisha Thomas MRN: [id number] DOB:Apr 04, 1971, 52 y.o., female Today's Date: 07/19/2023   PCP: Jordan Hawks, PA-C REFERRING PROVIDER: Carrolyn Leigh, FNP  END OF SESSION:  PT End of Session - 07/19/23 1453     Visit Number 7    Number of Visits 17    Date for PT Re-Evaluation 08/18/23    Authorization Type [employer] Medicaid-auth not required    Authorization - Visit Number 7    Authorization - Number of Visits 27   combined (but not getting OT)   PT Start Time 1450    PT Stop Time 1529    PT Time Calculation (min) 39 min    Equipment Utilized During Treatment --    Activity Tolerance Patient tolerated treatment well    Behavior During Therapy WFL for tasks assessed/performed                 Past Medical History:  Diagnosis Date   Hypertension    Ovarian cyst    Pelvic adhesions    Stomach ulcer    Past Surgical History:  Procedure Laterality Date   BREAST CYST ASPIRATION Left 2021   ECTOPIC PREGNANCY SURGERY     OVARIAN CYST SURGERY     PELVIC LAPAROSCOPY  11/30/2006   Diag Lap lysis of adhesions   Patient Active Problem List   Diagnosis Date Noted   Symptomatic anemia 03/26/2020   Iron deficiency anemia due to chronic blood loss 02/24/2018   Fibroids 07/04/2015   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   Peptic ulcer 09/10/2011   Ovarian cyst    Pelvic adhesions     ONSET DATE: 04/22/23  REFERRING DIAG: Z74.09 (ICD-10-CM) - Other reduced mobility Z78.9 (ICD-10-CM) - Other specified health status 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  Saw the neurologist and they are planning to do test in regards to Keppra.  (To have EEG Tuesday and then see MD on Wednesday). Pt accompanied by: self  PERTINENT HISTORY: hx of meningoma   PAIN:  Are you having pain? No  PRECAUTIONS: Fall  RED FLAGS: None   WEIGHT  BEARING RESTRICTIONS: No  FALLS: Has patient fallen in last 6 months? No  LIVING ENVIRONMENT: Lives with: lives with their family Lives in: House/apartment Stairs: Yes: External: 38 steps; can reach both Has following equipment at home: Walker - 2 wheeled, shower seat  PLOF: Independent with basic ADLs and Independent with household mobility with device Works in schools as Runner, broadcasting/film/video PATIENT GOALS: improve walking  OBJECTIVE:    TODAY'S TREATMENT: 07/19/2023 Activity Comments  FTSTS: 13.5 sec Improved from 25 sec  TUG: with cane:  14.53 sec; with no device:  17.13 sec  Improved from 30 sec  Gait velocity:  2.09 ft/sec with cane 1.87 ft/sec with no device   Floor>stand transfer:  pt goes onto tall kneel and comes up BLEs together from tall kneel to stand, heavy UE support at mat table; practiced going floor>1/2 kneel to stand, with UE support at table; performed x 2 Pt return demo understanding  Standing hip extension Standing hamstring curl Red band; cues for control through trunk; decreased active L hamstring motion with resistance  Seated hamstring curls 2x10 5# cable (towel under foot for heelslide)  Seated resisted hamstring curls Green band, 2 x 10, pt reports not yet doing with red band at home  Sit<>stand x 10 with LLE tucked posteriorly Minisquats to up tall standing, with cues for glut activation, 10 reps    PATIENT EDUCATION: Education details: Progress towards goals Person educated: Patient Education method: Explanation Education comprehension: verbalized  understanding   Access Code: [email or web address] URL: [email or web address] Date: 07/12/2023 Prepared by: [facility] - Outpatient  Rehab - [facility]  Exercises - Seated Hamstring Curls with Resistance  - 1 x daily - 7 x weekly - 3 sets - 10 reps - Seated Knee Extension with Resistance  - 1 x daily - 7 x weekly - 3 sets - 10 reps - Side Stepping with Resistance at Ankles and Counter Support  - 1 x daily - 7 x weekly - 1-3 sets - 1-2 minute rounds hold - Staggered Sit-to-Stand  - 1 x daily - 5 x weekly - 2 sets - 10 reps - Standing Toe Taps  - 1 x daily - 5 x weekly - 2 sets - 10 reps - Standing Gastroc Stretch at Counter  - 1 x daily - 7 x weekly - 1 sets - 3 reps - 30 sec hold - Single Leg Bridge  - 1 x daily - 5 x weekly - 2 sets - 10 reps - 3 sec hold - Mini Squat  - 1 x daily - 5 x weekly - 2 sets - 10 reps - Narrow Stance with Counter Support  - 1 x daily - 7 x weekly - 1 sets - 10 reps     PATIENT EDUCATION: Education details: HEP update Discussed performing weightbearing exercises (bridging, sit<>stand, minisquats without AFO is okay) Person educated: Patient Education method: Explanation, Demonstration, Tactile cues, Verbal cues, and Handouts Education comprehension: verbalized understanding and returned demonstration     Below measures were taken at time of initial evaluation unless otherwise specified:   DIAGNOSTIC FINDINGS: surgical procedure  COGNITION: Overall cognitive status: Within functional limits for tasks assessed   SENSATION: WFL  COORDINATION: Impaired LLE due to weakness  EDEMA:  none  MUSCLE TONE: LLE: Flaccid dorsiflexors/toe extensors, plantarflexors. Left hamstrings   MUSCLE LENGTH: WNL, developing some tightness in left ankle dorsiflexion due to paralysis  DTRs:  NT  POSTURE: No Significant postural limitations  LOWER EXTREMITY ROM:  Active  Right Eval Left Eval  Hip flexion 120 100  Hip extension    Hip  abduction    Hip adduction    Hip internal rotation    Hip external rotation    Knee flexion 120 120  Knee extension 0 0  Ankle dorsiflexion 20 0  Ankle plantarflexion 35 0  Ankle inversion    Ankle eversion     (Blank rows = not tested)  LLE limited due to weakness  LOWER EXTREMITY MMT:    MMT Right Eval Left Eval  Hip flexion 5 3+  Hip extension    Hip abduction 5 3  Hip adduction 5 5  Hip internal rotation    Hip external rotation    Knee flexion 5 3-  Knee extension 5 3+  Ankle dorsiflexion 5 0  Ankle plantarflexion 5 0  Ankle inversion 5 0  Ankle eversion 5 0  (Blank rows = not tested)  BED MOBILITY:  indep  TRANSFERS: Assistive device utilized: Environmental consultant - 2 wheeled  Sit to stand: Complete Independence and Modified independence Stand to sit: Complete Independence and Modified independence Chair to chair: Modified independence Floor:  NT  RAMP:  Level of Assistance: SBA Assistive device utilized: Environmental consultant - 2 wheeled Ramp Comments:   CURB:  Level of Assistance: SBA and CGA Assistive device utilized: Environmental consultant - 2 wheeled Curb Comments:   STAIRS: Level of Assistance: Modified independence and SBA Stair Negotiation Technique: Step to Pattern with Bilateral Rails Number of Stairs: 12  Height of Stairs: 4-6"  Comments: pt negotiating 38 steps to her apartment daily  GAIT: Gait pattern: step to pattern Distance walked:  Assistive device utilized: Environmental consultant - 2 wheeled Level of assistance: Modified independence Comments: use of fixed left AFO  FUNCTIONAL TESTS:  5 times sit to stand: 25 sec Timed up and go (TUG): 30 sec w/ RW and left AFO Berg Balance Scale: 42/56 Gait speed: TBD  TODAY'S TREATMENT:                                                                                                                              DATE: 06/23/23    PATIENT EDUCATION: Education details: assessment details, HEP initiated Person educated: Patient Education  method: Chief Technology Officer Education comprehension: needs further education  HOME EXERCISE PROGRAM: Access Code: [email or web address] URL: [email or web address] Date: 06/23/2023 Prepared by: Shary Decamp  Exercises - Seated Hamstring Curls with Resistance  - 1 x daily - 7 x weekly - 3 sets - 10 reps - Seated Knee Extension with Resistance  - 1 x daily - 7 x weekly - 3 sets - 10 reps - Side Stepping with Resistance at Ankles and Counter Support  - 1 x daily - 7 x weekly - 1-3 sets - 1-2 minute rounds hold  GOALS: Goals reviewed with patient? Yes  SHORT TERM GOALS: Target date: 07/21/2023    Patient will be independent in HEP to improve functional outcomes  Baseline: Goal status: IN PROGRESS  2.  Demo improved BLE strength and dynamic balance as evidenced by 15 sec 5xSTS test Baseline: 25 sec>13.5 sec Goal status: MET, 07/19/2023  3.  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  Demo ambulation speed 2.8 ft/sec to improve efficiency of community ambulation Baseline: TBD 2.09 ft/sec with cane (07/19/23) Goal status: IN PROGRESS  3.  Demo low risk for falls per score 50/56 Berg Balance Test Baseline: 42/56 Goal status: IN PROGRESS  4.  Demo low risk for falls per time 12 sec TUG test Baseline: 30 sec w/ RW>14.53 sec with cane; 17.13 sec with no device 07/19/2023 Goal status: IN PROGRESS  5.  Demo modified independent stair ambulation w/ reciprocal pattern to improve safety and efficiency with entering/exiting home environment Baseline: step-to pattern bilat  HR Goal status: IN PROGRESS    ASSESSMENT:  CLINICAL IMPRESSION: Pt arrives today with her quad tip cane, and improved overall even step length.  With fatigue, she has decreased LLE stance and more uneven step length.  Began assessing STGs this visit, with pt meeting STG 2 for improved FTSTS score.  Pt also has demo improved TUG score; gait velocity with cane is 2.09 ft/sec, so patient is progressing well to LTGs with these objective measures.  Worked on floor>stand transfer, and currently, pt is practicing at home going from tall kneel to standing, relying heavily on UE support on mat to push up to stand.  Worked to progress to tall>1/2 kneel then up to stand with less UE support.  Pt is progressing towards remaining STGs, which will be fully assessed next visit.  She continues to benefit from skilled PT towards improved functional mobility and independence. OBJECTIVE IMPAIRMENTS: Abnormal gait, decreased activity tolerance, decreased balance, decreased coordination, decreased endurance, decreased knowledge of use of DME, decreased mobility, difficulty walking, decreased ROM, decreased strength, and impaired tone.   ACTIVITY LIMITATIONS: carrying, lifting, stairs, transfers, and locomotion level  PARTICIPATION LIMITATIONS: meal prep, cleaning, laundry, driving, shopping, community activity, and occupation  PERSONAL FACTORS: Time since onset of injury/illness/exacerbation and 1 comorbidity: PMH  are also affecting patient's functional outcome.   REHAB POTENTIAL: Excellent  CLINICAL DECISION MAKING: Evolving/moderate complexity  EVALUATION COMPLEXITY: Moderate  PLAN:  PT FREQUENCY: 2x/week  PT DURATION: 8 weeks  PLANNED INTERVENTIONS: Therapeutic exercises, Therapeutic activity, Neuromuscular re-education, Balance training, Gait training, Patient/Family education, Self Care, Joint mobilization, Stair training, Vestibular training, Canalith repositioning, Orthotic/Fit training, DME  instructions, Aquatic Therapy, Dry Needling, Electrical stimulation, Spinal mobilization, Cryotherapy, Moist heat, Taping, Ionotophoresis 4mg /ml Dexamethasone, and Manual therapy  PLAN FOR NEXT SESSION: Continue gait with cane; continue hip and ankle strengthening LLE, floor to stand transfer; check remaining STGs including HEP     Lonia Blood, PT 07/19/23 5:27 PM Phone: [phone] Fax: [phone]  [facility] at [facility] [address], [address] [place], [place] [address] Phone # [phone] Fax # [phone]

## 2023-07-21 ENCOUNTER — Ambulatory Visit: Payer: Medicaid Other | Admitting: Physical Therapy

## 2023-07-21 ENCOUNTER — Encounter: Payer: Self-pay | Admitting: Physical Therapy

## 2023-07-21 NOTE — Therapy (Signed)
[facility] [address] [place], [place], [address] Phone: [phone]   Fax:  [phone]  Patient Details  Name: Alisha Thomas MRN: [id number] Date of Birth: 24-Apr-1971 Referring Provider:  Barbarann Ehlers  Encounter Date: 07/21/2023  Pt arrives and states she has slight headache, has not check BP in quite some time.  BP measures taken prior to initiating activity.  Pt rates headache as 3/10, reports this isn't completely unusual.  She denies any other HTN symptoms.  Pt reports she would like to contact MD and is in agreement with holding therapy session today.  No charge-  TODAY'S TREATMENT: 07/21/2023 Activity Comments  Vitals:  142/93 HR 78 After rest break:  145/104 After 2 minutes:  147/103      Ivar Domangue W., PT 07/21/2023, 1:36 PM  [facility] [address], [address] [place], [place], [address] Phone: [phone]   Fax:  [phone]

## 2023-07-24 NOTE — Therapy (Signed)
  OUTPATIENT PHYSICAL THERAPY NOTE   Patient Name: Alisha Thomas MRN: [id number] DOB:09/22/1971, 52 y.o., female Today's Date: 07/26/2023   PCP: Barbarann Ehlers REFERRING PROVIDER: Carrolyn Leigh, FNP   SUBJECTIVE:                                                                                                                                                                                             SUBJECTIVE STATEMENT: Reports that her MD told her that if her BP is >140/73mmHg, to take her Amlodipine. Has been monitoring it at home. Has taken this medication today.    OBJECTIVE:   TODAY'S TREATMENT: 07/26/23 Activity Comments  Vitals at start of session  152105 mmHg 86 bpm ; pt reports this is much higher than it has been at home  Edu on diaphragmatic breathing    Vitals  150/100 mmHg     PATIENT EDUCATION: Education details: advised pt to f/u with MD about remaining high BP Person educated: Patient Education method: Explanation Education comprehension: verbalized understanding   ASSESSMENT:  Patient arrived to session with report of getting prescribed Amlodipine for high BP which she was instructed to take if BP >140/52mmHg. Reports that she has already taken this medication today. Vitals at start of session revealed elevated BP which improved only slightly after diaphragmatic breathing. Patient requested to f/u with MD before proceeding wit PT sessions. Patient not seen today.      Anette Guarneri, PT, DPT 07/26/23 3:45 PM  [facility] [address] [place], [address] [place], [place] [address] Phone # [phone] Fax # [phone]

## 2023-07-26 ENCOUNTER — Ambulatory Visit: Payer: Medicaid Other | Admitting: Physical Therapy

## 2023-07-26 ENCOUNTER — Encounter: Payer: Self-pay | Admitting: Physical Therapy

## 2023-07-26 DIAGNOSIS — R2689 Other abnormalities of gait and mobility: Secondary | ICD-10-CM

## 2023-07-26 DIAGNOSIS — R262 Difficulty in walking, not elsewhere classified: Secondary | ICD-10-CM

## 2023-07-26 DIAGNOSIS — M6281 Muscle weakness (generalized): Secondary | ICD-10-CM

## 2023-07-26 DIAGNOSIS — R2681 Unsteadiness on feet: Secondary | ICD-10-CM

## 2023-07-28 ENCOUNTER — Encounter: Payer: Self-pay | Admitting: Physical Therapy

## 2023-07-28 ENCOUNTER — Ambulatory Visit: Payer: Medicaid Other | Admitting: Physical Therapy

## 2023-07-28 DIAGNOSIS — R2689 Other abnormalities of gait and mobility: Secondary | ICD-10-CM

## 2023-07-28 DIAGNOSIS — R262 Difficulty in walking, not elsewhere classified: Secondary | ICD-10-CM | POA: Diagnosis not present

## 2023-07-28 DIAGNOSIS — M6281 Muscle weakness (generalized): Secondary | ICD-10-CM

## 2023-07-28 DIAGNOSIS — R2681 Unsteadiness on feet: Secondary | ICD-10-CM

## 2023-07-28 NOTE — Therapy (Signed)
OUTPATIENT PHYSICAL THERAPY NEURO TREATMENT   Patient Name: Alisha Thomas MRN: [id number] DOB:05-24-1971, 52 y.o., female Today's Date: 07/28/2023   PCP: Jordan Hawks, PA-C REFERRING PROVIDER: Carrolyn Leigh, FNP  END OF SESSION:  PT End of Session - 07/28/23 1312     Visit Number 8    Number of Visits 17    Date for PT Re-Evaluation 08/18/23    Authorization Type [employer] Medicaid-auth not required    Authorization - Visit Number 8    Authorization - Number of Visits 27   combined (but not getting OT)   PT Start Time 1317    PT Stop Time 1400    PT Time Calculation (min) 43 min    Activity Tolerance Patient tolerated treatment well    Behavior During Therapy WFL for tasks assessed/performed                 Past Medical History:  Diagnosis Date   Hypertension    Ovarian cyst    Pelvic adhesions    Stomach ulcer    Past Surgical History:  Procedure Laterality Date   BREAST CYST ASPIRATION Left 2021   ECTOPIC PREGNANCY SURGERY     OVARIAN CYST SURGERY     PELVIC LAPAROSCOPY  11/30/2006   Diag Lap lysis of adhesions   Patient Active Problem List   Diagnosis Date Noted   Symptomatic anemia 03/26/2020   Iron deficiency anemia due to chronic blood loss 02/24/2018   Fibroids 07/04/2015   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   Peptic ulcer 09/10/2011   Ovarian cyst    Pelvic adhesions     ONSET DATE: 04/22/23  REFERRING DIAG: Z74.09 (ICD-10-CM) - Other reduced mobility Z78.9 (ICD-10-CM) - Other specified health status 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  *Couldn't get in with the EEG appointment and talked to PCP and she is aware that I am tapering Keppra (per patient decision/discussion with MD)  Pt accompanied by: self  PERTINENT HISTORY: hx of meningoma   PAIN:  Are you having pain? No  PRECAUTIONS: Fall  RED FLAGS: None   WEIGHT BEARING  RESTRICTIONS: No  FALLS: Has patient fallen in last 6 months? No  LIVING ENVIRONMENT: Lives with: lives with their family Lives in: House/apartment Stairs: Yes: External: 38 steps; can reach both Has following equipment at home: Walker - 2 wheeled, shower seat  PLOF: Independent with basic ADLs and Independent with household mobility with device Works in schools as Runner, broadcasting/film/video PATIENT GOALS: improve walking  OBJECTIVE:    TODAY'S TREATMENT: 07/28/2023 Activity Comments  Vitals 149/88  On pt's home device  Vitals 130/90 HR 87 bpm  On clinic device  Standing strengthening:  hip abduction 2 x 10 Hip extension 2 x 10 Hamstring curls 2 x 10 Standing march 2 x 10 3#  Monster walk forward/back 3 reps with lessening UE support 3#  SLS: Foot propped on 6" step -head turns x 5 -head nods x 5 -EC -UE lifts  With and without AFO on LLE; increased sway and need for UE support with LLE as stance  Vitals 131/85 At end of session       PATIENT EDUCATION: Education details: BP monitoring and her cuff appears to be in line with clinic cuff; discussed option for trial of foot-up brace next visit and pt in agreement Person educated: Patient Education method: Explanation Education comprehension: verbalized understanding   Access Code: [email or web address] URL: [email or web address] Date: 07/12/2023 Prepared by: [facility] - [facility]  Exercises - Seated Hamstring Curls with Resistance  - 1 x daily - 7 x weekly - 3 sets - 10 reps -  Seated Knee Extension with Resistance  - 1 x daily - 7 x weekly - 3 sets - 10 reps - Side Stepping with Resistance at Ankles and Counter Support  - 1 x daily - 7 x weekly - 1-3 sets - 1-2 minute rounds hold - Staggered Sit-to-Stand  - 1 x daily - 5 x weekly - 2 sets - 10 reps - Standing Toe Taps  - 1 x daily - 5 x weekly - 2 sets - 10 reps - Standing Gastroc Stretch at Counter  - 1 x daily - 7 x weekly - 1 sets - 3 reps - 30 sec hold - Single Leg Bridge  - 1 x daily - 5 x weekly - 2 sets - 10 reps - 3 sec hold - Mini Squat  - 1 x daily - 5 x weekly - 2 sets - 10 reps - Narrow Stance with Counter Support  - 1 x daily - 7 x weekly - 1 sets - 10 reps     PATIENT EDUCATION: Education details: HEP update Discussed performing weightbearing exercises (bridging, sit<>stand, minisquats without AFO is okay) Person educated: Patient Education method: Explanation, Demonstration, Tactile cues, Verbal cues, and Handouts Education comprehension: verbalized understanding and returned demonstration     Below measures were taken at time of initial evaluation unless otherwise specified:   DIAGNOSTIC FINDINGS: surgical procedure  COGNITION: Overall cognitive status: Within functional limits for tasks assessed   SENSATION: WFL  COORDINATION: Impaired LLE due to weakness  EDEMA:  none  MUSCLE TONE: LLE: Flaccid dorsiflexors/toe extensors, plantarflexors. Left hamstrings   MUSCLE LENGTH: WNL, developing some tightness in left ankle dorsiflexion due to paralysis  DTRs:  NT  POSTURE: No Significant postural limitations  LOWER EXTREMITY ROM:     Active  Right Eval Left Eval  Hip flexion 120 100  Hip extension    Hip abduction    Hip adduction    Hip internal rotation    Hip  external rotation    Knee flexion 120 120  Knee extension 0 0  Ankle dorsiflexion 20 0  Ankle plantarflexion 35 0  Ankle inversion    Ankle eversion     (Blank rows = not tested)  LLE limited due to  weakness  LOWER EXTREMITY MMT:    MMT Right Eval Left Eval  Hip flexion 5 3+  Hip extension    Hip abduction 5 3  Hip adduction 5 5  Hip internal rotation    Hip external rotation    Knee flexion 5 3-  Knee extension 5 3+  Ankle dorsiflexion 5 0  Ankle plantarflexion 5 0  Ankle inversion 5 0  Ankle eversion 5 0  (Blank rows = not tested)  BED MOBILITY:  indep  TRANSFERS: Assistive device utilized: Environmental consultant - 2 wheeled  Sit to stand: Complete Independence and Modified independence Stand to sit: Complete Independence and Modified independence Chair to chair: Modified independence Floor:  NT  RAMP:  Level of Assistance: SBA Assistive device utilized: Environmental consultant - 2 wheeled Ramp Comments:   CURB:  Level of Assistance: SBA and CGA Assistive device utilized: Environmental consultant - 2 wheeled Curb Comments:   STAIRS: Level of Assistance: Modified independence and SBA Stair Negotiation Technique: Step to Pattern with Bilateral Rails Number of Stairs: 12  Height of Stairs: 4-6"  Comments: pt negotiating 38 steps to her apartment daily  GAIT: Gait pattern: step to pattern Distance walked:  Assistive device utilized: Environmental consultant - 2 wheeled Level of assistance: Modified independence Comments: use of fixed left AFO  FUNCTIONAL TESTS:  5 times sit to stand: 25 sec Timed up and go (TUG): 30 sec w/ RW and left AFO Berg Balance Scale: 42/56 Gait speed: TBD  TODAY'S TREATMENT:                                                                                                                              DATE: 06/23/23    PATIENT EDUCATION: Education details: assessment details, HEP initiated Person educated: Patient Education method: Chief Technology Officer Education comprehension: needs further education  HOME EXERCISE PROGRAM: Access Code: [email or web address] URL: [email or web address] Date: 06/23/2023 Prepared by: Shary Decamp  Exercises - Seated Hamstring Curls with Resistance   - 1 x daily - 7 x weekly - 3 sets - 10 reps - Seated Knee Extension with Resistance  - 1 x daily - 7 x weekly - 3 sets - 10 reps - Side Stepping with Resistance at Ankles and Counter Support  - 1 x daily - 7 x weekly - 1-3 sets - 1-2 minute rounds hold  GOALS: Goals reviewed with patient? Yes  SHORT TERM GOALS: Target date: 07/21/2023    Patient will be independent in HEP to improve functional outcomes Baseline: Goal status: IN PROGRESS  2.  Demo improved BLE strength and dynamic balance as evidenced by 15 sec 5xSTS test Baseline: 25 sec>13.5 sec Goal status: MET, 07/19/2023  3.  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  Demo ambulation speed 2.8 ft/sec to improve efficiency of community ambulation Baseline: TBD 2.09 ft/sec with cane (07/19/23) Goal status: IN PROGRESS  3.  Demo low risk for falls per score 50/56 Berg Balance Test Baseline: 42/56 Goal status: IN PROGRESS  4.  Demo low risk for falls per time 12 sec TUG test Baseline: 30 sec w/ RW>14.53 sec with cane; 17.13 sec with no device 07/19/2023 Goal status: IN PROGRESS  5.  Demo modified independent stair ambulation w/ reciprocal pattern to improve safety and efficiency with entering/exiting home environment Baseline: step-to pattern bilat HR Goal status: IN PROGRESS    ASSESSMENT:  CLINICAL IMPRESSION: Pt has not been able to complete last two attempted visits, due to BP higher than acceptable parameters for activity.  She has seen MD and gotten increase in BP medication; BP measures WFL today during PT  session.  Focused on LLE strengthening in open chain and closed chain activities to help with improved LLE SLS.  Pt wears L AFO for most of session and is able to control L knee with only one episode of L knee recurvatum in stance.  With AFO removed for static LLE SLS activities, she has increased unsteadiness and sway.  She will continue to benefit from skilled PT to further address strength and balance; likely will benefit from progression to standing HEP at home.   OBJECTIVE IMPAIRMENTS: Abnormal gait, decreased activity tolerance, decreased balance, decreased coordination, decreased endurance, decreased knowledge of use of DME, decreased mobility, difficulty walking, decreased ROM, decreased strength, and impaired tone.   ACTIVITY LIMITATIONS: carrying, lifting, stairs, transfers, and locomotion level  PARTICIPATION LIMITATIONS: meal prep, cleaning, laundry, driving, shopping, community activity, and occupation  PERSONAL FACTORS: Time since onset of injury/illness/exacerbation and 1 comorbidity: PMH  are also affecting patient's functional outcome.   REHAB POTENTIAL: Excellent  CLINICAL DECISION MAKING: Evolving/moderate complexity  EVALUATION COMPLEXITY: Moderate  PLAN:  PT FREQUENCY: 2x/week  PT DURATION: 8 weeks  PLANNED INTERVENTIONS: Therapeutic exercises, Therapeutic activity, Neuromuscular re-education, Balance training, Gait training, Patient/Family education, Self Care, Joint mobilization, Stair training, Vestibular training, Canalith repositioning, Orthotic/Fit training, DME instructions, Aquatic Therapy, Dry Needling, Electrical stimulation, Spinal mobilization, Cryotherapy, Moist heat, Taping, Ionotophoresis 4mg /ml Dexamethasone, and Manual therapy  PLAN FOR NEXT SESSION: Try foot-up brace.  Continue gait with cane; continue hip and ankle strengthening LLE, floor to stand transfer; formally upgrade HEP to standing with ankle weights (need to take out some of the seated  exercises, probably)     Lonia Blood, PT 07/28/23 5:40 PM Phone: [phone] Fax: [phone]  [facility] at [facility] [address], [address] [place], [place] [address] Phone # [phone] Fax # [phone]

## 2023-08-03 ENCOUNTER — Ambulatory Visit: Payer: Medicaid Other | Admitting: Physical Therapy

## 2023-08-04 ENCOUNTER — Ambulatory Visit: Payer: Medicaid Other | Attending: Nurse Practitioner | Admitting: Physical Therapy

## 2023-08-04 ENCOUNTER — Encounter: Payer: Self-pay | Admitting: Physical Therapy

## 2023-08-04 DIAGNOSIS — R2689 Other abnormalities of gait and mobility: Secondary | ICD-10-CM | POA: Diagnosis present

## 2023-08-04 DIAGNOSIS — R2681 Unsteadiness on feet: Secondary | ICD-10-CM | POA: Diagnosis present

## 2023-08-04 DIAGNOSIS — R262 Difficulty in walking, not elsewhere classified: Secondary | ICD-10-CM | POA: Insufficient documentation

## 2023-08-04 DIAGNOSIS — M6281 Muscle weakness (generalized): Secondary | ICD-10-CM | POA: Diagnosis present

## 2023-08-04 NOTE — Patient Instructions (Signed)
To orthotist in regards to Alisha Thomas:  Patient is wearing her left AFO.  She has improved strength and ROM since PT eval.  At eval, she had 0 to trace/5 muscle strength at L ankle.  She has improved to 3-/5 left ankle dorsiflexion, eversion and plantarflexion.  She has improved to 3+/5 for L hamstring and 4/5 L quads.  Please consider follow-up with Dinita to decide about possible trimming down or lightening up of her current AFO, as she is getting stronger.  Without AFO, she does have more of a steppage gait and foot slap at times, but she may benefit from adjustments to current AFO to allow for improved use of strength and optimal gait pattern.  Thanks,  Lonia Blood, PT

## 2023-08-04 NOTE — Therapy (Signed)
OUTPATIENT PHYSICAL THERAPY NEURO TREATMENT   Patient Name: Alisha Thomas MRN: [id number] DOB:[birth year], 52 y.o., female Today's Date: 08/04/2023   PCP: Jordan Hawks, PA-C REFERRING PROVIDER: Carrolyn Leigh, FNP  END OF SESSION:  PT End of Session - 08/04/23 1614     Visit Number 9    Number of Visits 17    Date for PT Re-Evaluation 08/18/23    Authorization Type [employer] Medicaid-auth not required    Authorization - Visit Number 9    Authorization - Number of Visits 27   combined (but not getting OT)   PT Start Time 1613    PT Stop Time 1700    PT Time Calculation (min) 47 min    Activity Tolerance Patient tolerated treatment well    Behavior During Therapy WFL for tasks assessed/performed                  Past Medical History:  Diagnosis Date   Hypertension    Ovarian cyst    Pelvic adhesions    Stomach ulcer    Past Surgical History:  Procedure Laterality Date   BREAST CYST ASPIRATION Left 2021   ECTOPIC PREGNANCY SURGERY     OVARIAN CYST SURGERY     PELVIC LAPAROSCOPY  11/30/2006   Diag Lap lysis of adhesions   Patient Active Problem List   Diagnosis Date Noted   Symptomatic anemia 03/26/2020   Iron deficiency anemia due to chronic blood loss 02/24/2018   Fibroids 07/04/2015   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   Peptic ulcer 09/10/2011   Ovarian cyst    Pelvic adhesions     ONSET DATE: 04/22/23  REFERRING DIAG: Z74.09 (ICD-10-CM) - Other reduced mobility Z78.9 (ICD-10-CM) - Other specified health status 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  Daughter has been here visiting.  Have been taking the new medication dosage, so BP seems okay.  Pt accompanied by: self  PERTINENT HISTORY: hx of meningoma   PAIN:  Are you having pain? No  PRECAUTIONS: Fall  RED FLAGS: None   WEIGHT BEARING RESTRICTIONS: No  FALLS: Has patient fallen in last 6 months? No  LIVING ENVIRONMENT: Lives  with: lives with their family Lives in: House/apartment Stairs: Yes: External: 38 steps; can reach both Has following equipment at home: Walker - 2 wheeled, shower seat  PLOF: Independent with basic ADLs and Independent with household mobility with device Works in schools as Runner, broadcasting/film/video PATIENT GOALS: improve walking  OBJECTIVE:    TODAY'S TREATMENT: 08/04/2023 Activity Comments  Vitals:  135/86   Gait with L foot up brace 40 ft x 2 Foot slap noted  Gait with L foot-up brace 40 ft x 2, no cane, cues for slowed pace More heel>toe pattern and less foot slap  Single limb LLE plantarflexion 5 reps x 2 sets Not quite full range  Stagger stance forward/back rocking Difficulty with LLE push off  Forward/back walking at counter, 3 reps Cues for heel strike/push off in forward direction, increased LLE step length in posterior direction  Forward/back marching at counter, 2 reps   Seated recumbent bike, BLEs only, level 3, x 6 min, forward RPM > 40 for hamstring, LLE strengthening  No AFO, no cane, resisted gait 80 ft x 2 reps Cues for heelstrike, step length; pt increases speed and arm swing (begins to collapse through L trunk a bit)      MMT:  L ankle dorsiflexion 3-/5  L ankle eversion 3-/5  L ankle plantar flexion3-/5  L hamstrings 3+/5  L quads 4/5  Access Code: [email or web address] URL: [email or web address] Date: 08/04/2023 Prepared by: [facility] - [facility]  Exercises - Seated Hamstring Curls with Resistance  - 1 x daily - 7 x  weekly - 3 sets - 10 reps - Seated Knee Extension with Resistance  - 1 x daily - 7 x weekly - 3 sets - 10 reps - Side Stepping with Resistance at Ankles and Counter Support  - 1 x daily - 7 x weekly - 1-3 sets - 1-2 minute rounds hold - Staggered Sit-to-Stand  - 1 x daily - 5 x weekly - 2 sets - 10 reps - Standing Toe Taps  - 1 x daily - 5 x weekly - 2 sets - 10 reps - Standing Gastroc Stretch at Counter  - 1 x daily - 7 x weekly - 1 sets - 3 reps - 30 sec hold - Single Leg Bridge  - 1 x daily - 5 x weekly - 2 sets - 10 reps - 3 sec hold - Mini Squat  - 1 x daily - 5 x weekly - 2 sets - 10 reps - Narrow Stance with Counter Support  - 1 x daily - 7 x weekly - 1 sets - 10 reps - Single Leg Heel Raise with Counter Support  - 1 x daily - 5 x weekly - 3 sets - 10 reps - 3 sec hold - Backward Walking with Counter Support  - 1 x daily - 5 x weekly - 1 sets - 5 reps  PATIENT EDUCATION: Education details: Discussed options for discussing with orthotist (pt reports in [place]) to trim or lessen her current brace to allow for more active L foot motion (discussed also not ready to go without any brace for long distances, as she continued to have ankle weakness and foot up brace doesn't appear to be fully adequate), additions to HEP Person educated: Patient Education method: Explanation, Demonstration, Verbal cues, and Handouts Education comprehension: verbalized understanding, returned demonstration, and needs further education     Below measures were taken at time of initial evaluation unless otherwise specified:   DIAGNOSTIC FINDINGS:  surgical procedure  COGNITION: Overall cognitive status: Within functional limits for tasks assessed   SENSATION: WFL  COORDINATION: Impaired LLE due to weakness  EDEMA:  none  MUSCLE TONE: LLE: Flaccid dorsiflexors/toe extensors, plantarflexors. Left hamstrings   MUSCLE LENGTH: WNL, developing some tightness in left ankle dorsiflexion due to  paralysis  DTRs:  NT  POSTURE: No Significant postural limitations  LOWER EXTREMITY ROM:     Active  Right Eval Left Eval  Hip flexion 120 100  Hip extension    Hip abduction    Hip adduction    Hip internal rotation    Hip external rotation    Knee flexion 120 120  Knee extension 0 0  Ankle dorsiflexion 20 0  Ankle plantarflexion 35 0  Ankle inversion    Ankle eversion     (Blank rows = not tested)  LLE limited due to weakness  LOWER EXTREMITY MMT:    MMT Right Eval Left Eval  Hip flexion 5 3+  Hip extension    Hip abduction 5 3  Hip adduction 5 5  Hip internal rotation    Hip external rotation    Knee flexion 5 3-  Knee extension 5 3+  Ankle dorsiflexion 5 0  Ankle plantarflexion 5 0  Ankle inversion 5 0  Ankle eversion 5 0  (Blank rows = not tested)  BED MOBILITY:  indep  TRANSFERS: Assistive device utilized: Environmental consultant - 2 wheeled  Sit to stand: Complete Independence and Modified independence Stand to sit: Complete Independence and Modified independence Chair to chair: Modified independence Floor:  NT  RAMP:  Level of Assistance: SBA Assistive device utilized: Environmental consultant - 2 wheeled Ramp Comments:   CURB:  Level of Assistance: SBA and CGA Assistive device utilized: Environmental consultant - 2 wheeled Curb Comments:   STAIRS: Level of Assistance: Modified independence and SBA Stair Negotiation Technique: Step to Pattern with Bilateral Rails Number of Stairs: 12  Height of Stairs: 4-6"  Comments: pt negotiating 38 steps to her apartment daily  GAIT: Gait pattern: step to pattern Distance walked:  Assistive device utilized: Environmental consultant - 2 wheeled Level of assistance: Modified independence Comments: use of fixed left AFO  FUNCTIONAL TESTS:  5 times sit to stand: 25 sec Timed up and go (TUG): 30 sec w/ RW and left AFO Berg Balance Scale: 42/56 Gait speed: TBD  TODAY'S TREATMENT:                                                                                                                               DATE: 06/23/23    PATIENT EDUCATION: Education details: assessment details, HEP initiated Person educated: Patient Education method: Chief Technology Officer Education comprehension: needs further education  HOME EXERCISE PROGRAM: Access Code: [email or web address] URL: [email or web address] Date: 06/23/2023 Prepared by: Shary Decamp  Exercises - Seated Hamstring Curls with Resistance  - 1 x daily - 7 x weekly - 3 sets - 10 reps - Seated Knee Extension  with Resistance  - 1 x daily - 7 x weekly - 3 sets - 10 reps - Side Stepping with Resistance at Ankles and Counter Support  - 1 x daily - 7 x weekly - 1-3 sets - 1-2 minute rounds hold  GOALS: Goals reviewed with patient? Yes  SHORT TERM GOALS: Target date: 07/21/2023    Patient will be independent in HEP to improve functional outcomes Baseline: Goal status: IN PROGRESS  2.  Demo improved BLE strength and dynamic balance as evidenced by 15 sec 5xSTS test Baseline: 25 sec>13.5 sec Goal status: MET, 07/19/2023  3.  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  Demo ambulation speed 2.8 ft/sec to improve efficiency of community ambulation Baseline: TBD 2.09 ft/sec with cane (07/19/23) Goal status: IN PROGRESS  3.  Demo low risk for falls per score 50/56 Berg Balance Test Baseline: 42/56 Goal status: IN PROGRESS  4.  Demo low risk for falls per time 12 sec TUG test Baseline: 30 sec w/ RW>14.53 sec with cane; 17.13 sec with no device 07/19/2023 Goal status: IN PROGRESS  5.   Demo modified independent stair ambulation w/ reciprocal pattern to improve safety and efficiency with entering/exiting home environment Baseline: step-to pattern bilat HR Goal status: IN PROGRESS    ASSESSMENT:  CLINICAL IMPRESSION: Pt presents to OPPT today with continued BP measures WFL.  Trialed foot up brace on LLE.  With this, she has more foot slap than expected with some foot drag.  Assessed pt's LLE strength (see above) and she has improved ankle motion and strength in comparison to first day.  Discussed that she may contact orthotist (in [place]) to discuss making adjustments/modifications to her current AFO, to help her utilize her improved strength, and continue to optimize her gait pattern (to lessen trunk and hip compensations with gait).  Updated HEP to work more on L ankle plantarflexion strength as well as foot placement with gait activities at counter.  She will continue to benefit from skilled PT towards goals for improved functional mobility.  She will continue to benefit from skilled PT to further address strength and balance; likely will benefit from progression to standing HEP at home.   OBJECTIVE IMPAIRMENTS: Abnormal gait, decreased activity tolerance, decreased balance, decreased coordination, decreased endurance, decreased knowledge of use of DME, decreased mobility, difficulty walking, decreased ROM, decreased strength, and impaired tone.   ACTIVITY LIMITATIONS: carrying, lifting, stairs, transfers, and locomotion level  PARTICIPATION LIMITATIONS: meal prep, cleaning, laundry, driving, shopping, community activity, and occupation  PERSONAL FACTORS: Time since onset of injury/illness/exacerbation and 1 comorbidity: PMH  are also affecting patient's functional outcome.   REHAB POTENTIAL: Excellent  CLINICAL DECISION MAKING: Evolving/moderate complexity  EVALUATION COMPLEXITY: Moderate  PLAN:  PT FREQUENCY: 2x/week  PT DURATION: 8 weeks  PLANNED INTERVENTIONS:  Therapeutic exercises, Therapeutic activity, Neuromuscular re-education, Balance training, Gait training, Patient/Family education, Self Care, Joint mobilization, Stair training, Vestibular training, Canalith repositioning, Orthotic/Fit training, DME instructions, Aquatic Therapy, Dry Needling, Electrical stimulation, Spinal mobilization, Cryotherapy, Moist heat, Taping, Ionotophoresis 4mg /ml Dexamethasone, and Manual therapy  PLAN FOR NEXT SESSION: Ask if pt followed  up with orthotist.  Review updated HEP; resisted gait, trunk strengthening/arm swing.  Continue gait with cane; continue hip and ankle strengthening LLE, floor to stand transfer; formally upgrade HEP to standing with ankle weights (need to take out some of the seated exercises, probably)     Lonia Blood, PT 08/04/23 5:35 PM Phone: [phone] Fax: [phone]  [facility] at [facility] [address], [address] [place], [place] [address] Phone # [phone] Fax # [phone]

## 2023-08-05 ENCOUNTER — Ambulatory Visit: Payer: Medicaid Other | Admitting: Physical Therapy

## 2023-08-09 ENCOUNTER — Encounter: Payer: Self-pay | Admitting: Physical Therapy

## 2023-08-09 ENCOUNTER — Ambulatory Visit: Payer: Medicaid Other | Admitting: Physical Therapy

## 2023-08-09 DIAGNOSIS — M6281 Muscle weakness (generalized): Secondary | ICD-10-CM

## 2023-08-09 DIAGNOSIS — R2689 Other abnormalities of gait and mobility: Secondary | ICD-10-CM

## 2023-08-09 DIAGNOSIS — R2681 Unsteadiness on feet: Secondary | ICD-10-CM

## 2023-08-09 NOTE — Therapy (Signed)
OUTPATIENT PHYSICAL THERAPY NEURO TREATMENT   Patient Name: Alisha Thomas MRN: [id number] DOB:06-Jul-1971, 52 y.o., female Today's Date: 08/09/2023   PCP: Jordan Hawks, PA-C REFERRING PROVIDER: Carrolyn Leigh, FNP  END OF SESSION:  PT End of Session - 08/09/23 1528     Visit Number 10    Number of Visits 17    Date for PT Re-Evaluation 08/18/23    Authorization Type U[employer] Medicaid-auth not required    Authorization - Visit Number 10    Authorization - Number of Visits 27   combined (but not getting OT)   PT Start Time 1532    PT Stop Time 1615    PT Time Calculation (min) 43 min    Activity Tolerance Patient tolerated treatment well    Behavior During Therapy WFL for tasks assessed/performed                   Past Medical History:  Diagnosis Date   Hypertension    Ovarian cyst    Pelvic adhesions    Stomach ulcer    Past Surgical History:  Procedure Laterality Date   BREAST CYST ASPIRATION Left 2021   ECTOPIC PREGNANCY SURGERY     OVARIAN CYST SURGERY     PELVIC LAPAROSCOPY  11/30/2006   Diag Lap lysis of adhesions   Patient Active Problem List   Diagnosis Date Noted   Symptomatic anemia 03/26/2020   Iron deficiency anemia due to chronic blood loss 02/24/2018   Fibroids 07/04/2015   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   Peptic ulcer 09/10/2011   Ovarian cyst    Pelvic adhesions     ONSET DATE: 04/22/23  REFERRING DIAG: Z74.09 (ICD-10-CM) - Other reduced mobility Z78.9 (ICD-10-CM) - Other specified health status 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  They trimmed the back  of the AFO and they took the footplate back towards my great toe joint.  Feel like I have more freedom of movement.  Pt accompanied by: self  PERTINENT HISTORY: hx of meningoma   PAIN:  Are you having pain? No  PRECAUTIONS: Fall  RED FLAGS: None   WEIGHT  BEARING RESTRICTIONS: No  FALLS: Has patient fallen in last 6 months? No  LIVING ENVIRONMENT: Lives with: lives with their family Lives in: House/apartment Stairs: Yes: External: 38 steps; can reach both Has following equipment at home: Walker - 2 wheeled, shower seat  PLOF: Independent with basic ADLs and Independent with household mobility with device Works in schools as Runner, broadcasting/film/video PATIENT GOALS: improve walking  OBJECTIVE:    TODAY'S TREATMENT: 08/09/2023 Activity Comments  Vitals:  135/97 HR 83   Gait with (newly modified) L AFO, cane, 85 ft x 2 reps, then no device 85 ft x 2 reps Supervision, cues for arm swing; good foot clearance, no recurvatum noted LLE  Resisted forward gait, 10# weight at [employer] guard/min assist   Resisted sidestepping Green band  Resisted hip abduction, 2 x 10 reps Green band, then 2#, BLE; with band-excess trunk motion  Resisted hip extension 10 reps  2# BLE  Hamstring curls 2# BLE  Hip/knee flexion 2# BLE  Suitcase carry, 3 x 25 ft, each UE  7#  135/92, HR 85 bpm End of session   Access Code: [email or web address] URL: [email or web address] Date: 08/09/2023 Prepared by: [facility] - Outpatient  Rehab - [facility]  Exercises - Side Stepping with Resistance at Ankles and Counter Support  - 1 x daily - 7 x weekly - 1-3 sets - 1-2 minute rounds hold - Staggered Sit-to-Stand  - 1 x daily - 5 x weekly - 2 sets - 10 reps - Standing Toe Taps  - 1 x daily - 5 x weekly - 2 sets - 10 reps - Standing Gastroc Stretch at Counter  - 1 x daily - 7 x  weekly - 1 sets - 3 reps - 30 sec hold - Mini Squat  - 1 x daily - 5 x weekly - 2 sets - 10 reps - Narrow Stance with Counter Support  - 1 x daily - 7 x weekly - 1 sets - 10 reps - Single Leg Heel Raise with Counter Support  - 1 x daily - 5 x weekly - 3 sets - 10 reps - 3 sec hold - Backward Walking with Counter Support  - 1 x daily - 5 x weekly - 1 sets - 5 reps - Standing Hip Abduction with Ankle Weight  - 1 x daily - 4 x weekly - 3 sets - 10 reps - Standing Hip Extension with Ankle Weight  - 1 x daily - 4 x weekly - 3 sets - 10 reps - Standing Knee Flexion with Ankle Weight  - 1 x daily - 4 x weekly - 3 sets - 10 reps    PATIENT EDUCATION: Education details: Updates to HEP (took out supine/seated and added standing); discussed starting 3 sets of 10 no weight until she can secure weights (start at 2# and work up to 3 sets of 10) Person educated: Patient Education method: Programmer, multimedia, Facilities manager, Verbal cues, and Handouts Education comprehension: verbalized understanding, returned demonstration, and needs further education     Below measures were taken at time of initial evaluation unless otherwise specified:   DIAGNOSTIC FINDINGS: surgical procedure  COGNITION: Overall cognitive status: Within functional limits for tasks assessed   SENSATION: WFL  COORDINATION: Impaired LLE due to weakness  EDEMA:  none  MUSCLE TONE: LLE: Flaccid dorsiflexors/toe extensors, plantarflexors. Left hamstrings   MUSCLE LENGTH: WNL, developing some tightness in left ankle dorsiflexion due to paralysis  DTRs:  NT  POSTURE: No Significant postural limitations  LOWER EXTREMITY  ROM:     Active  Right Eval Left Eval  Hip flexion 120 100  Hip extension    Hip abduction    Hip adduction    Hip internal rotation    Hip external rotation    Knee flexion 120 120  Knee extension 0 0  Ankle dorsiflexion 20 0  Ankle plantarflexion 35 0  Ankle inversion    Ankle eversion     (Blank  rows = not tested)  LLE limited due to weakness Updated MMT taken 08/05/2023 MMT:  L ankle dorsiflexion 3-/5  L ankle eversion 3-/5  L ankle plantar flexion3-/5  L hamstrings 3+/5  L quads 4/5  LOWER EXTREMITY MMT:    MMT Right Eval Left Eval  Hip flexion 5 3+  Hip extension    Hip abduction 5 3  Hip adduction 5 5  Hip internal rotation    Hip external rotation    Knee flexion 5 3-  Knee extension 5 3+  Ankle dorsiflexion 5 0  Ankle plantarflexion 5 0  Ankle inversion 5 0  Ankle eversion 5 0  (Blank rows = not tested)  BED MOBILITY:  indep  TRANSFERS: Assistive device utilized: Environmental consultant - 2 wheeled  Sit to stand: Complete Independence and Modified independence Stand to sit: Complete Independence and Modified independence Chair to chair: Modified independence Floor:  NT  RAMP:  Level of Assistance: SBA Assistive device utilized: Environmental consultant - 2 wheeled Ramp Comments:   CURB:  Level of Assistance: SBA and CGA Assistive device utilized: Environmental consultant - 2 wheeled Curb Comments:   STAIRS: Level of Assistance: Modified independence and SBA Stair Negotiation Technique: Step to Pattern with Bilateral Rails Number of Stairs: 12  Height of Stairs: 4-6"  Comments: pt negotiating 38 steps to her apartment daily  GAIT: Gait pattern: step to pattern Distance walked:  Assistive device utilized: Environmental consultant - 2 wheeled Level of assistance: Modified independence Comments: use of fixed left AFO  FUNCTIONAL TESTS:  5 times sit to stand: 25 sec Timed up and go (TUG): 30 sec w/ RW and left AFO Berg Balance Scale: 42/56 Gait speed: TBD  TODAY'S TREATMENT:                                                                                                                              DATE: 06/23/23    PATIENT EDUCATION: Education details: assessment details, HEP initiated Person educated: Patient Education method: Chief Technology Officer Education comprehension: needs further  education  HOME EXERCISE PROGRAM: Access Code: [email or web address] URL: [email or web address] Date: 06/23/2023 Prepared by: Shary Decamp  Exercises - Seated Hamstring Curls with Resistance  - 1 x daily - 7 x weekly - 3 sets - 10 reps - Seated Knee Extension with Resistance  - 1 x daily - 7 x weekly - 3 sets - 10 reps - Side Stepping with Resistance at Ankles and Counter Support  - 1 x daily - 7 x weekly - 1-3  sets - 1-2 minute rounds hold  GOALS: Goals reviewed with patient? Yes  SHORT TERM GOALS: Target date: 07/21/2023    Patient will be independent in HEP to improve functional outcomes Baseline: Goal status: IN PROGRESS  2.  Demo improved BLE strength and dynamic balance as evidenced by 15 sec 5xSTS test Baseline: 25 sec>13.5 sec Goal status: MET, 07/19/2023  3.  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  Demo ambulation speed 2.8 ft/sec to improve efficiency of community ambulation Baseline: TBD 2.09 ft/sec with cane (07/19/23) Goal status: IN PROGRESS  3.  Demo low risk for falls per score 50/56 Berg Balance Test Baseline: 42/56 Goal status: IN PROGRESS  4.  Demo low risk for falls per time 12 sec TUG test Baseline: 30 sec w/ RW>14.53 sec with cane; 17.13 sec with no device 07/19/2023 Goal status: IN PROGRESS  5.  Demo modified independent stair ambulation w/ reciprocal pattern to improve safety and efficiency with entering/exiting home environment Baseline: step-to pattern bilat HR Goal status: IN PROGRESS    ASSESSMENT:  CLINICAL  IMPRESSION: Skilled PT session today focused on gait training and strength training.  Since last PT session, pt has contacted and gone into see orthotist, who trimmed back her current AFO.  She feels this has made a difference in more motion through LLE.  Worked on resisted gait activities, with pt having some excess trunk motion, improving with repetition, but 10# weight at [employer] with resisted gait is difficult.  Worked on [employer], to add standing ankle weights for LLE strengthening.  Again, she begins with excess trunk motion, but is able to be more stable with cues and repetition.  She does have some initial recurvatum L knee with LLE as stance, but is able to correct.  She will continue to benefit from skilled PT towards goals for improved functional mobility and independence. OBJECTIVE IMPAIRMENTS: Abnormal gait, decreased activity tolerance, decreased balance, decreased coordination, decreased endurance, decreased knowledge of use of DME, decreased mobility, difficulty walking, decreased ROM, decreased strength, and impaired tone.   ACTIVITY LIMITATIONS: carrying, lifting, stairs, transfers, and locomotion level  PARTICIPATION LIMITATIONS: meal prep, cleaning, laundry, driving, shopping, community activity, and occupation  PERSONAL FACTORS: Time since onset of injury/illness/exacerbation and 1 comorbidity: PMH  are also affecting patient's functional outcome.   REHAB POTENTIAL: Excellent  CLINICAL DECISION MAKING: Evolving/moderate complexity  EVALUATION COMPLEXITY: Moderate  PLAN:  PT FREQUENCY: 2x/week  PT DURATION: 8 weeks  PLANNED INTERVENTIONS: Therapeutic exercises, Therapeutic activity, Neuromuscular re-education, Balance training, Gait training, Patient/Family education, Self Care, Joint mobilization, Stair training, Vestibular training, Canalith repositioning, Orthotic/Fit training, DME instructions, Aquatic Therapy, Dry Needling, Electrical stimulation, Spinal  mobilization, Cryotherapy, Moist heat, Taping, Ionotophoresis 4mg /ml Dexamethasone, and Manual therapy  PLAN FOR NEXT SESSION: Review updates to HEP; continue resisted gait, trunk strengthening/arm swing.  Continue gait with/without cane; continue hip and ankle strengthening LLE    Lonia Blood, PT 08/09/23 8:48 PM Phone: [phone] Fax: [phone]  [facility] at [facility] [address] [place], [address] [place], [place] [address] Phone # [phone] Fax # [phone]

## 2023-08-10 NOTE — Therapy (Signed)
OUTPATIENT PHYSICAL THERAPY NEURO TREATMENT   Patient Name: Alisha Thomas MRN: [id number] DOB:09-19-1971, 52 y.o., female Today's Date: 08/11/2023   PCP: Jordan Hawks, PA-C REFERRING PROVIDER: Carrolyn Leigh, FNP  END OF SESSION:  PT End of Session - 08/11/23 1621     Visit Number 11    Number of Visits 17    Date for PT Re-Evaluation 08/18/23    Authorization Type UHC Medicaid-auth not required    Authorization - Visit Number 11    Authorization - Number of Visits 27   combined (but not getting OT)   PT Start Time 1537    PT Stop Time 1620    PT Time Calculation (min) 43 min    Equipment Utilized During Treatment Gait belt    Activity Tolerance Patient tolerated treatment well    Behavior During Therapy WFL for tasks assessed/performed                    Past Medical History:  Diagnosis Date   Hypertension    Ovarian cyst    Pelvic adhesions    Stomach ulcer    Past Surgical History:  Procedure Laterality Date   BREAST CYST ASPIRATION Left 2021   ECTOPIC PREGNANCY SURGERY     OVARIAN CYST SURGERY     PELVIC LAPAROSCOPY  11/30/2006   Diag Lap lysis of adhesions   Patient Active Problem List   Diagnosis Date Noted   Symptomatic anemia 03/26/2020   Iron deficiency anemia due to chronic blood loss 02/24/2018   Fibroids 07/04/2015   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   Peptic ulcer 09/10/2011   Ovarian cyst    Pelvic adhesions     ONSET DATE: 04/22/23  REFERRING DIAG: Z74.09 (ICD-10-CM) - Other reduced mobility Z78.9 (ICD-10-CM) - Other specified health status 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   Pt accompanied by: self  PERTINENT HISTORY: hx of meningoma   PAIN:  Are you having pain? No  PRECAUTIONS: Fall  RED FLAGS: None   WEIGHT BEARING RESTRICTIONS: No  FALLS: Has patient fallen in last 6  months? No  LIVING ENVIRONMENT: Lives with: lives with their family Lives in: House/apartment Stairs: Yes: External: 38 steps; can reach both Has following equipment at home: Walker - 2 wheeled, shower seat  PLOF: Independent with basic ADLs and Independent with household mobility with device Works in schools as Runner, broadcasting/film/video PATIENT GOALS: improve walking  OBJECTIVE:     TODAY'S TREATMENT: 08/11/23 Activity Comments  Vitals at start of session  150/97 mmHg, 86bpm   Vitals recheck 131/84mmHg, 84bpm   Review of updates to HEP: standing hip ABD 2# 10x each standing hip EX 2# 10x each standing HS curl 2# 10x each Good form  resisted walking in pulley:  fwd/back 4x15# back/fwd  4x15#8 Occasional min A and cueing to increase L foot step, control speed  Gait training on treadmill   Ranging 1-60mph; 5 min total; weaned to 1 UE support; cueing for achieving L TKE   Pre-gait training; repeated L foot step with heel touch    Gait training with/without cane 250 ft Focused on achieving L TKE- some imbalance without SPC while working on this       Access Code: [email or web address] URL: [email or web address] Date: 08/09/2023 Prepared by: [facility] - Outpatient  Rehab - [facility]  Exercises - Side Stepping with Resistance at Ankles and Counter Support  - 1 x daily - 7 x weekly - 1-3 sets - 1-2 minute rounds hold - Staggered Sit-to-Stand  - 1 x daily - 5 x weekly - 2 sets - 10 reps - Standing Toe Taps  - 1 x daily - 5 x weekly - 2 sets - 10 reps - Standing Gastroc  Stretch at Counter  - 1 x daily - 7 x weekly - 1 sets - 3 reps - 30 sec hold - Mini Squat  - 1 x daily - 5 x weekly - 2 sets - 10 reps - Narrow Stance with Counter Support  - 1 x daily - 7 x weekly - 1 sets - 10 reps - Single Leg Heel Raise with Counter Support  - 1 x daily - 5 x weekly - 3 sets - 10 reps - 3 sec hold - Backward Walking with Counter Support  - 1 x daily - 5 x weekly - 1 sets - 5 reps - Standing Hip Abduction with Ankle Weight  - 1 x daily - 4 x weekly - 3 sets - 10 reps - Standing Hip Extension with Ankle Weight  - 1 x daily - 4 x weekly - 3 sets - 10 reps - Standing Knee Flexion with Ankle Weight  - 1 x daily - 4 x weekly - 3 sets - 10 reps      Below measures were taken at time of initial evaluation unless otherwise specified:   DIAGNOSTIC FINDINGS: surgical procedure  COGNITION: Overall cognitive status: Within functional limits for tasks assessed   SENSATION: WFL  COORDINATION: Impaired LLE due to weakness  EDEMA:  none  MUSCLE TONE: LLE: Flaccid dorsiflexors/toe extensors, plantarflexors. Left hamstrings   MUSCLE LENGTH: WNL, developing some tightness in left ankle dorsiflexion due to paralysis  DTRs:  NT  POSTURE: No Significant postural limitations  LOWER EXTREMITY ROM:     Active  Right Eval Left Eval  Hip flexion 120 100  Hip extension    Hip abduction    Hip adduction    Hip internal rotation    Hip external rotation    Knee flexion 120 120  Knee extension 0 0  Ankle dorsiflexion 20  0  Ankle plantarflexion 35 0  Ankle inversion    Ankle eversion     (Blank rows = not tested)  LLE limited due to weakness Updated MMT taken 08/05/2023 MMT:  L ankle dorsiflexion 3-/5  L ankle eversion 3-/5  L ankle plantar flexion3-/5  L hamstrings 3+/5  L quads 4/5  LOWER EXTREMITY MMT:    MMT Right Eval Left Eval  Hip flexion 5 3+  Hip extension    Hip abduction 5 3  Hip adduction 5 5  Hip internal rotation    Hip external rotation     Knee flexion 5 3-  Knee extension 5 3+  Ankle dorsiflexion 5 0  Ankle plantarflexion 5 0  Ankle inversion 5 0  Ankle eversion 5 0  (Blank rows = not tested)  BED MOBILITY:  indep  TRANSFERS: Assistive device utilized: Environmental consultant - 2 wheeled  Sit to stand: Complete Independence and Modified independence Stand to sit: Complete Independence and Modified independence Chair to chair: Modified independence Floor:  NT  RAMP:  Level of Assistance: SBA Assistive device utilized: Environmental consultant - 2 wheeled Ramp Comments:   CURB:  Level of Assistance: SBA and CGA Assistive device utilized: Environmental consultant - 2 wheeled Curb Comments:   STAIRS: Level of Assistance: Modified independence and SBA Stair Negotiation Technique: Step to Pattern with Bilateral Rails Number of Stairs: 12  Height of Stairs: 4-6"  Comments: pt negotiating 38 steps to her apartment daily  GAIT: Gait pattern: step to pattern Distance walked:  Assistive device utilized: Environmental consultant - 2 wheeled Level of assistance: Modified independence Comments: use of fixed left AFO  FUNCTIONAL TESTS:  5 times sit to stand: 25 sec Timed up and go (TUG): 30 sec w/ RW and left AFO Berg Balance Scale: 42/56 Gait speed: TBD  TODAY'S TREATMENT:                                                                                                                              DATE: 06/23/23    PATIENT EDUCATION: Education details: assessment details, HEP initiated Person educated: Patient Education method: Chief Technology Officer Education comprehension: needs further education  HOME EXERCISE PROGRAM: Access Code: [email or web address] URL: [email or web address] Date: 06/23/2023 Prepared by: Shary Decamp  Exercises - Seated Hamstring Curls with Resistance  - 1 x daily - 7 x weekly - 3 sets - 10 reps - Seated Knee Extension with Resistance  - 1 x daily - 7 x weekly - 3 sets - 10 reps - Side Stepping with Resistance at Ankles and Counter Support   - 1 x daily - 7 x weekly - 1-3 sets - 1-2 minute rounds hold  GOALS: Goals reviewed with patient? Yes  SHORT TERM GOALS: Target date: 07/21/2023    Patient will be independent in HEP to improve functional outcomes Baseline: Goal status: IN PROGRESS  2.  Demo improved BLE strength and dynamic balance as evidenced by 15 sec 5xSTS test Baseline: 25  sec>13.5 sec Goal status: MET, 07/19/2023  3.  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  Demo ambulation speed 2.8 ft/sec to improve efficiency of community ambulation Baseline: TBD 2.09 ft/sec with cane (07/19/23) Goal status: IN PROGRESS  3.  Demo low risk for falls per score 50/56 Berg Balance Test Baseline: 42/56 Goal status: IN PROGRESS  4.  Demo low risk for falls per time 12 sec TUG test Baseline: 30 sec w/ RW>14.53 sec with cane; 17.13 sec with no device 07/19/2023 Goal status: IN PROGRESS  5.  Demo modified independent stair ambulation w/ reciprocal pattern to improve safety and efficiency with entering/exiting home environment Baseline: step-to pattern bilat HR Goal status: IN PROGRESS    ASSESSMENT:  CLINICAL IMPRESSION:  Patient arrived to session without complaints. Reviewed HEP was performed with good form. Resisted walking revealed mild difficulty controlling momentum and significant B in-toeing. Initiated gait training on TM and overground, focusing on achieving L TKE in order to improve efficiency of gait. Patient demonstrates good effort to correct this. No complaints at end of session.  OBJECTIVE  IMPAIRMENTS: Abnormal gait, decreased activity tolerance, decreased balance, decreased coordination, decreased endurance, decreased knowledge of use of DME, decreased mobility, difficulty walking, decreased ROM, decreased strength, and impaired tone.   ACTIVITY LIMITATIONS: carrying, lifting, stairs, transfers, and locomotion level  PARTICIPATION LIMITATIONS: meal prep, cleaning, laundry, driving, shopping, community activity, and occupation  PERSONAL FACTORS: Time since onset of injury/illness/exacerbation and 1 comorbidity: PMH  are also affecting patient's functional outcome.   REHAB POTENTIAL: Excellent  CLINICAL DECISION MAKING: Evolving/moderate complexity  EVALUATION COMPLEXITY: Moderate  PLAN:  PT FREQUENCY: 2x/week  PT DURATION: 8 weeks  PLANNED INTERVENTIONS: Therapeutic exercises, Therapeutic activity, Neuromuscular re-education, Balance training, Gait training, Patient/Family education, Self Care, Joint mobilization, Stair training, Vestibular training, Canalith repositioning, Orthotic/Fit training, DME instructions, Aquatic Therapy, Dry Needling, Electrical stimulation, Spinal mobilization, Cryotherapy, Moist heat, Taping, Ionotophoresis 4mg /ml Dexamethasone, and Manual therapy  PLAN FOR NEXT SESSION:continue resisted gait, trunk strengthening/arm swing.  Continue gait with/without cane; continue hip and ankle strengthening LLE   Anette Guarneri, PT, DPT 08/11/23 4:33 PM  [facility] at [facility] [address] [place], [address] [place], [place] [address] Phone # [phone] Fax # [phone]

## 2023-08-11 ENCOUNTER — Ambulatory Visit: Payer: Medicaid Other | Admitting: Physical Therapy

## 2023-08-11 ENCOUNTER — Encounter: Payer: Self-pay | Admitting: Physical Therapy

## 2023-08-11 DIAGNOSIS — R2689 Other abnormalities of gait and mobility: Secondary | ICD-10-CM

## 2023-08-11 DIAGNOSIS — M6281 Muscle weakness (generalized): Secondary | ICD-10-CM | POA: Diagnosis not present

## 2023-08-11 DIAGNOSIS — R2681 Unsteadiness on feet: Secondary | ICD-10-CM

## 2023-08-11 DIAGNOSIS — R262 Difficulty in walking, not elsewhere classified: Secondary | ICD-10-CM

## 2023-08-13 NOTE — Therapy (Signed)
OUTPATIENT PHYSICAL THERAPY NEURO TREATMENT   Patient Name: Alisha Thomas MRN: [id number] DOB:27-Apr-1971, 52 y.o., female Today's Date: 08/16/2023   PCP: Jordan Hawks, PA-C REFERRING PROVIDER: Carrolyn Leigh, FNP  END OF SESSION:  PT End of Session - 08/16/23 1618     Visit Number 12    Number of Visits 17    Date for PT Re-Evaluation 08/18/23    Authorization Type [employer] Medicaid-auth not required    Authorization - Visit Number 12    Authorization - Number of Visits 27   combined (but not getting OT)   PT Start Time 1527    PT Stop Time 1613    PT Time Calculation (min) 46 min    Equipment Utilized During Treatment Gait belt    Activity Tolerance Patient tolerated treatment well    Behavior During Therapy WFL for tasks assessed/performed                     Past Medical History:  Diagnosis Date   Hypertension    Ovarian cyst    Pelvic adhesions    Stomach ulcer    Past Surgical History:  Procedure Laterality Date   BREAST CYST ASPIRATION Left 2021   ECTOPIC PREGNANCY SURGERY     OVARIAN CYST SURGERY     PELVIC LAPAROSCOPY  11/30/2006   Diag Lap lysis of adhesions   Patient Active Problem List   Diagnosis Date Noted   Symptomatic anemia 03/26/2020   Iron deficiency anemia due to chronic blood loss 02/24/2018   Fibroids 07/04/2015   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   Peptic ulcer 09/10/2011   Ovarian cyst    Pelvic adhesions     ONSET DATE: 04/22/23  REFERRING DIAG: Z74.09 (ICD-10-CM) - Other reduced mobility Z78.9 (ICD-10-CM) - Other specified health status 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 Didn't do anything. Went to her apartment gym- did the treadmill at 3.0 mph. Asking about what exercises she can do to strengthen her ankle.   Pt accompanied by: self  PERTINENT HISTORY: hx of meningoma   PAIN:  Are you having pain?  No  PRECAUTIONS: Fall  RED FLAGS: None   WEIGHT BEARING RESTRICTIONS: No  FALLS: Has patient fallen in last 6 months? No  LIVING ENVIRONMENT: Lives with: lives with their family Lives in: House/apartment Stairs: Yes: External: 38 steps; can reach both Has following equipment at home: Walker - 2 wheeled, shower seat  PLOF: Independent with basic ADLs and Independent with household mobility with device Works in schools as Runner, broadcasting/film/video PATIENT GOALS: improve walking  OBJECTIVE:    TODAY'S TREATMENT: 08/16/23 Activity Comments  Sitting L 4 way ankle with towel    Sitting L 3 way ankle with red TB 10x  Showed several different variations and ways to use band  Standing heel raise B, then single on L, then B with eccentric phase L only 5x each  Challenging on single leg  SLS + rolling ball under foot CW/CCW 3x10 each  Weaning to 1 fingertip support; more difficulty controlling/coordinating L LE  Runner's stretch 30" each LE   Toe tap to 2-3 cones Focused on L wt shift to tap R foot d/t apprehension to shift wt  Alt step ups holding green medball  CGA d/t mild instability    HOME EXERCISE PROGRAM Last updated: 08/16/23 Access Code: [email or web address] URL: [email or web address] Date: 08/16/2023 Prepared by: [facility] - Outpatient  Rehab - [facility]  Exercises - Side Stepping with Resistance at Ankles and Counter Support  - 1 x daily - 7 x weekly - 1-3 sets - 1-2 minute rounds hold - Staggered Sit-to-Stand  - 1 x daily - 5 x weekly - 2 sets - 10 reps -  Standing Toe Taps  - 1 x daily - 5 x weekly - 2 sets - 10 reps - Standing Gastroc Stretch at Counter  - 1 x daily - 7 x weekly - 1 sets - 3 reps - 30 sec hold - Mini Squat  - 1 x daily - 5 x weekly - 2 sets - 10 reps - Narrow Stance with Counter Support  - 1 x daily - 7 x weekly - 1 sets - 10 reps - Single Leg Heel Raise with Counter Support  - 1 x daily - 5 x weekly - 3 sets - 10 reps - 3 sec hold - Backward Walking with Counter Support  - 1 x daily - 5 x weekly - 1 sets - 5 reps - Standing Hip Abduction with Ankle Weight  - 1 x daily - 4 x weekly - 3 sets - 10 reps - Standing Hip Extension with Ankle Weight  - 1 x daily - 4 x weekly - 3 sets - 10 reps - Standing Knee Flexion with Ankle Weight  - 1 x daily - 4 x weekly - 3 sets - 10 reps - Long Sitting Ankle Inversion with Resistance  - 1 x daily - 5 x weekly - 2 sets - 10 reps - Long Sitting Ankle Eversion with Resistance  - 1 x daily - 5 x weekly - 2 sets - 10 reps - Seated Ankle Dorsiflexion with Resistance  - 1 x daily - 5 x weekly - 2 sets - 10 reps - Standing Eccentric Heel Raise  - 1 x daily - 5 x weekly - 2 sets - 10 reps - Single Leg Stance + toe tap to 2 cones - 1 x daily - 5 x weekly - 2 sets - 10 reps   PATIENT EDUCATION: Education details: HEP update  Person educated: Patient Education method: Explanation, Demonstration, Tactile cues, Verbal  cues, and Handouts Education comprehension: verbalized understanding and returned demonstration    Below measures were taken at time of initial evaluation unless otherwise specified:   DIAGNOSTIC FINDINGS: surgical procedure  COGNITION: Overall cognitive status: Within functional limits for tasks assessed   SENSATION: WFL  COORDINATION: Impaired LLE due to weakness  EDEMA:  none  MUSCLE TONE: LLE: Flaccid dorsiflexors/toe extensors, plantarflexors. Left hamstrings   MUSCLE LENGTH: WNL, developing some tightness in left ankle dorsiflexion due to paralysis  DTRs:   NT  POSTURE: No Significant postural limitations  LOWER EXTREMITY ROM:     Active  Right Eval Left Eval  Hip flexion 120 100  Hip extension    Hip abduction    Hip adduction    Hip internal rotation    Hip external rotation    Knee flexion 120 120  Knee extension 0 0  Ankle dorsiflexion 20 0  Ankle plantarflexion 35 0  Ankle inversion    Ankle eversion     (Blank rows = not tested)  LLE limited due to weakness Updated MMT taken 08/05/2023 MMT:  L ankle dorsiflexion 3-/5  L ankle eversion 3-/5  L ankle plantar flexion3-/5  L hamstrings 3+/5  L quads 4/5  LOWER EXTREMITY MMT:    MMT Right Eval Left Eval  Hip flexion 5 3+  Hip extension    Hip abduction 5 3  Hip adduction 5 5  Hip internal rotation    Hip external rotation    Knee flexion 5 3-  Knee extension 5 3+  Ankle dorsiflexion 5 0  Ankle plantarflexion 5 0  Ankle inversion 5 0  Ankle eversion 5 0  (Blank rows = not tested)  BED MOBILITY:  indep  TRANSFERS: Assistive device utilized: Environmental consultant - 2 wheeled  Sit to stand: Complete Independence and Modified independence Stand to sit: Complete Independence and Modified independence Chair to chair: Modified independence Floor:  NT  RAMP:  Level of Assistance: SBA Assistive device utilized: Environmental consultant - 2 wheeled Ramp Comments:   CURB:  Level of Assistance: SBA and CGA Assistive device utilized: Environmental consultant - 2 wheeled Curb Comments:   STAIRS: Level of Assistance: Modified independence and SBA Stair Negotiation Technique: Step to Pattern with Bilateral Rails Number of Stairs: 12  Height of Stairs: 4-6"  Comments: pt negotiating 38 steps to her apartment daily  GAIT: Gait pattern: step to pattern Distance walked:  Assistive device utilized: Environmental consultant - 2 wheeled Level of assistance: Modified independence Comments: use of fixed left AFO  FUNCTIONAL TESTS:  5 times sit to stand: 25 sec Timed up and go (TUG): 30 sec w/ RW and left AFO Berg Balance  Scale: 42/56 Gait speed: TBD  TODAY'S TREATMENT:                                                                                                                              DATE: 06/23/23    PATIENT EDUCATION: Education details: assessment details,  HEP initiated Person educated: Patient Education method: Chief Technology Officer Education comprehension: needs further education  HOME EXERCISE PROGRAM: Access Code: [email or web address] URL: [email or web address] Date: 06/23/2023 Prepared by: Shary Decamp  Exercises - Seated Hamstring Curls with Resistance  - 1 x daily - 7 x weekly - 3 sets - 10 reps - Seated Knee Extension with Resistance  - 1 x daily - 7 x weekly - 3 sets - 10 reps - Side Stepping with Resistance at Ankles and Counter Support  - 1 x daily - 7 x weekly - 1-3 sets - 1-2 minute rounds hold  GOALS: Goals reviewed with patient? Yes  SHORT TERM GOALS: Target date: 07/21/2023    Patient will be independent in HEP to improve functional outcomes Baseline: Goal status: IN PROGRESS  2.  Demo improved BLE strength and dynamic balance as evidenced by 15 sec 5xSTS test Baseline: 25 sec>13.5 sec Goal status: MET, 07/19/2023  3.  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  Demo ambulation speed 2.8 ft/sec to improve efficiency of community ambulation Baseline: TBD 2.09 ft/sec with cane (07/19/23) Goal status: IN PROGRESS  3.  Demo low risk for falls per score 50/56 Berg Balance Test Baseline: 42/56 Goal status: IN PROGRESS  4.  Demo low risk for falls per  time 12 sec TUG test Baseline: 30 sec w/ RW>14.53 sec with cane; 17.13 sec with no device 07/19/2023 Goal status: IN PROGRESS  5.  Demo modified independent stair ambulation w/ reciprocal pattern to improve safety and efficiency with entering/exiting home environment Baseline: step-to pattern bilat HR Goal status: IN PROGRESS    ASSESSMENT:  CLINICAL IMPRESSION: Patient arrived to session without complaints. Requested ankle strengthening today, thus this was practiced with variations to be able to perform this independently at home. Also worked on [employer] stability with cueing to increase L wt shift to improve balance on this side. Patient is somewhat hesitant shifting weight to hemiplegic side and will require continued practice and encouragement. No complaints at end of session.  OBJECTIVE IMPAIRMENTS: Abnormal gait, decreased activity tolerance, decreased balance, decreased coordination, decreased endurance, decreased knowledge of use of DME, decreased mobility, difficulty walking, decreased ROM, decreased strength, and impaired tone.   ACTIVITY LIMITATIONS: carrying, lifting, stairs, transfers, and locomotion level  PARTICIPATION LIMITATIONS: meal prep, cleaning, laundry, driving, shopping, community activity, and occupation  PERSONAL FACTORS: Time since onset of injury/illness/exacerbation and 1 comorbidity: PMH  are also affecting patient's functional outcome.   REHAB POTENTIAL: Excellent  CLINICAL DECISION MAKING: Evolving/moderate complexity  EVALUATION COMPLEXITY: Moderate  PLAN:  PT FREQUENCY: 2x/week  PT DURATION: 8 weeks  PLANNED INTERVENTIONS: Therapeutic exercises, Therapeutic activity, Neuromuscular re-education, Balance training, Gait training, Patient/Family education, Self Care, Joint mobilization, Stair training, Vestibular training, Canalith repositioning, Orthotic/Fit training, DME instructions, Aquatic Therapy, Dry Needling, Electrical stimulation, Spinal  mobilization, Cryotherapy, Moist heat, Taping, Ionotophoresis 4mg /ml Dexamethasone, and Manual therapy  PLAN FOR NEXT SESSION: recert vs. DC; continue resisted gait, trunk strengthening/arm swing.  Continue gait with/without cane; continue hip and ankle strengthening LLE   Anette Guarneri, PT, DPT 08/16/23 4:21 PM  [facility]  at [facility] [address] [place], [place] [address] Phone # [phone] Fax # [phone]

## 2023-08-16 ENCOUNTER — Encounter: Payer: Self-pay | Admitting: Physical Therapy

## 2023-08-16 ENCOUNTER — Ambulatory Visit: Payer: Medicaid Other | Admitting: Physical Therapy

## 2023-08-16 DIAGNOSIS — M6281 Muscle weakness (generalized): Secondary | ICD-10-CM | POA: Diagnosis not present

## 2023-08-16 DIAGNOSIS — R262 Difficulty in walking, not elsewhere classified: Secondary | ICD-10-CM

## 2023-08-16 DIAGNOSIS — R2681 Unsteadiness on feet: Secondary | ICD-10-CM

## 2023-08-16 DIAGNOSIS — R2689 Other abnormalities of gait and mobility: Secondary | ICD-10-CM

## 2023-08-18 ENCOUNTER — Encounter: Payer: Self-pay | Admitting: Physical Therapy

## 2023-08-18 ENCOUNTER — Ambulatory Visit: Payer: Medicaid Other | Admitting: Physical Therapy

## 2023-08-18 DIAGNOSIS — R2689 Other abnormalities of gait and mobility: Secondary | ICD-10-CM

## 2023-08-18 DIAGNOSIS — M6281 Muscle weakness (generalized): Secondary | ICD-10-CM | POA: Diagnosis not present

## 2023-08-18 DIAGNOSIS — R2681 Unsteadiness on feet: Secondary | ICD-10-CM

## 2023-08-18 NOTE — Therapy (Signed)
OUTPATIENT PHYSICAL THERAPY NEURO TREATMENT/RECERT   Patient Name: Alisha Thomas MRN: [id number] DOB:[birth year], 52 y.o., female Today's Date: 08/18/2023   PCP: Jordan Hawks, PA-C REFERRING PROVIDER: Carrolyn Leigh, FNP  END OF SESSION:  PT End of Session - 08/18/23 1533     Visit Number 13    Number of Visits 19    Date for PT Re-Evaluation 10/01/23    Authorization Type [employer] Medicaid-auth not required    Authorization - Visit Number 13    Authorization - Number of Visits 27   combined (but not getting OT)   PT Start Time 1533    PT Stop Time 1615    PT Time Calculation (min) 42 min    Equipment Utilized During Treatment --    Activity Tolerance Patient tolerated treatment well    Behavior During Therapy WFL for tasks assessed/performed                      Past Medical History:  Diagnosis Date   Hypertension    Ovarian cyst    Pelvic adhesions    Stomach ulcer    Past Surgical History:  Procedure Laterality Date   BREAST CYST ASPIRATION Left 2021   ECTOPIC PREGNANCY SURGERY     OVARIAN CYST SURGERY     PELVIC LAPAROSCOPY  11/30/2006   Diag Lap lysis of adhesions   Patient Active Problem List   Diagnosis Date Noted   Symptomatic anemia 03/26/2020   Iron deficiency anemia due to chronic blood loss 02/24/2018   Fibroids 07/04/2015   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   Peptic ulcer 09/10/2011   Ovarian cyst    Pelvic adhesions     ONSET DATE: 04/22/23  REFERRING DIAG: Z74.09 (ICD-10-CM) - Other reduced mobility Z78.9 (ICD-10-CM) - Other specified health status 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  Would like to continue to get stronger in ankle and hip  Pt accompanied by: self  PERTINENT HISTORY: hx of meningoma   PAIN:  Are you having pain? No  PRECAUTIONS: Fall  RED FLAGS: None   WEIGHT BEARING RESTRICTIONS: No  FALLS: Has patient fallen in last  6 months? No  LIVING ENVIRONMENT: Lives with: lives with their family Lives in: House/apartment Stairs: Yes: External: 38 steps; can reach both Has following equipment at home: Walker - 2 wheeled, shower seat  PLOF: Independent with basic ADLs and Independent with household mobility with device Works in schools as Runner, broadcasting/film/video PATIENT GOALS: improve walking  OBJECTIVE:    TODAY'S TREATMENT: 08/18/2023 Activity Comments  Gait velocity:  12.19 sec = 2.69 ft/sec No cane, wearing AFO  Gait velocity 11.84 sec  (2.77 ft/sec) No cane, no AFO  TUG 12.78 sec no cane   6 MWT :  850 ft (150 ft in 1st minute and 6th minute) No cane, no AFO Decreased heelstrike; foot slap noted with fatigue  Berg Balance test 52/56 Improved from 42/56  FGA 19/30 See below  Reviewed standing step taps and standing eccentric heel raises Return demo understanding  Floor to stand with light support at floor, Dala Dock     [facility] PT Assessment - 08/18/23 1541       Standardized Balance Assessment   Standardized Balance Assessment Berg Balance Test      Berg Balance Test   Sit to Stand Able to stand without using hands and stabilize independently    Standing Unsupported Able to stand safely 2 minutes    Sitting with Back Unsupported but Feet Supported on Floor or Stool Able to sit safely and securely 2 minutes    Stand to Sit Sits safely with minimal use of hands    Transfers Able to transfer safely, minor use of hands    Standing Unsupported with Eyes Closed Able to stand 10 seconds safely    Standing  Unsupported with Feet Together Able to place feet together independently and stand 1 minute safely    From Standing, Reach Forward with Outstretched Arm Can reach confidently >25 cm (10")    From Standing Position, Pick up Object from Floor Able to pick up shoe safely and easily    From Standing Position, Turn to Look Behind Over each Shoulder Looks behind from both sides and weight shifts well    Turn 360 Degrees Able to turn 360 degrees safely but slowly    Standing Unsupported, Alternately Place Feet on Step/Stool Able to stand independently and safely and complete 8 steps in 20 seconds    Standing Unsupported, One Foot in Front Able to plae foot ahead of the other independently and hold 30 seconds    Standing on One Leg Able to lift leg independently and hold 5-10 seconds   RLE 8.2 sec, LLE 2.15 sec   Total Score 52      Functional Gait  Assessment   Gait assessed  Yes    Gait Level Surface Walks 20 ft, slow speed, abnormal gait pattern, evidence for imbalance or deviates 10-15 in outside of the 12 in walkway width. Requires more than 7 sec to ambulate 20 ft.   8.63 sec   Change in Gait Speed Able to smoothly change walking speed without loss of balance or gait deviation. Deviate no more than 6 in outside of the 12 in walkway width.    Gait with Horizontal Head Turns Performs head turns smoothly with slight change in gait velocity (eg, minor disruption to smooth gait path), deviates 6-10 in outside 12 in walkway width, or uses an assistive device.   9.78 sec   Gait with Vertical Head Turns Performs task with slight change in gait velocity (eg, minor disruption to smooth gait path), deviates  6 - 10 in outside 12 in walkway width or uses assistive device   9.94 sec   Gait and Pivot Turn Pivot turns safely within 3 sec and stops quickly with no loss of balance.    Step Over Obstacle Is able to step over one shoe box (4.5 in total height) without changing gait speed. No evidence of imbalance.     Gait with Narrow Base of Support Ambulates less than 4 steps heel to toe or cannot perform without assistance.    Gait with Eyes Closed Walks 20 ft, slow speed, abnormal gait pattern, evidence for imbalance, deviates 10-15 in outside 12 in walkway width. Requires more than 9 sec to ambulate 20 ft.   13.72 sec   Ambulating Backwards Walks 20 ft, uses assistive device, slower speed, mild gait deviations, deviates 6-10 in outside 12 in walkway width.   22 sec   Steps Alternating feet, no rail.    Total Score 19    FGA comment: Scores <22/30 indicate increased fall risk.              TODAY'S TREATMENT: 08/16/23 Activity Comments  Sitting L 4 way ankle with towel    Sitting L 3 way ankle with red TB 10x  Showed several different variations and ways to use band  Standing heel raise B, then single on L, then B with eccentric phase L only 5x each  Challenging on single leg  SLS + rolling ball under foot CW/CCW 3x10 each  Weaning to 1 fingertip support; more difficulty controlling/coordinating L LE  Runner's stretch 30" each LE   Toe tap to 2-3 cones Focused on L wt shift to tap R foot d/t apprehension to shift wt  Alt step ups holding green medball  CGA d/t mild instability    HOME EXERCISE PROGRAM Last updated: 08/16/23 Access Code: [email or web address] URL: [email or web address] Date: 08/16/2023 Prepared by: [facility] - Outpatient  Rehab - [facility]  Exercises - Side Stepping with Resistance at Ankles and Counter Support  - 1 x daily - 7 x weekly - 1-3 sets - 1-2 minute rounds hold - Staggered Sit-to-Stand  - 1 x daily - 5 x weekly - 2 sets - 10 reps - Standing Toe Taps  - 1 x daily - 5 x weekly - 2 sets - 10 reps - Standing Gastroc Stretch at Counter  - 1 x daily - 7 x weekly - 1 sets - 3 reps - 30 sec hold - Mini Squat  - 1 x daily - 5 x weekly - 2 sets - 10 reps - Narrow Stance with Counter Support  - 1 x daily - 7 x weekly - 1 sets - 10 reps - Single Leg Heel Raise with  Counter Support  - 1 x daily - 5 x weekly - 3 sets - 10 reps - 3 sec hold - Backward Walking with Counter Support  - 1 x daily - 5 x weekly - 1 sets - 5 reps - Standing Hip Abduction with Ankle Weight  - 1 x daily - 4 x weekly - 3 sets - 10 reps - Standing Hip Extension with Ankle Weight  - 1 x daily - 4 x weekly - 3 sets - 10 reps - Standing Knee Flexion with Ankle Weight  - 1 x daily - 4 x weekly - 3 sets - 10 reps - Long Sitting Ankle Inversion with Resistance  - 1 x daily - 5 x weekly - 2 sets - 10  reps - Long Sitting Ankle Eversion with Resistance  - 1 x daily - 5 x weekly - 2 sets - 10 reps - Seated Ankle Dorsiflexion with Resistance  - 1 x daily - 5 x weekly - 2 sets - 10 reps - Standing Eccentric Heel Raise  - 1 x daily - 5 x weekly - 2 sets - 10 reps - Single Leg Stance + toe tap to 2 cones - 1 x daily - 5 x weekly - 2 sets - 10 reps   PATIENT EDUCATION: Education details: Progress towards goals, POC (08/18/2023)  Person educated: Patient Education method: Programmer, multimedia, Demonstration, Tactile cues, Verbal cues, and Handouts Education comprehension: verbalized understanding and returned demonstration    Below measures were taken at time of initial evaluation unless otherwise specified:   DIAGNOSTIC FINDINGS: surgical procedure  COGNITION: Overall cognitive status: Within functional limits for tasks assessed   SENSATION: WFL  COORDINATION: Impaired LLE due to weakness  EDEMA:  none  MUSCLE TONE: LLE: Flaccid dorsiflexors/toe extensors, plantarflexors. Left hamstrings   MUSCLE LENGTH: WNL, developing some tightness in left ankle dorsiflexion due to paralysis  DTRs:  NT  POSTURE: No Significant postural limitations  LOWER EXTREMITY ROM:     Active  Right Eval Left Eval  Hip flexion 120 100  Hip extension    Hip abduction    Hip adduction    Hip internal rotation    Hip external rotation    Knee flexion 120 120  Knee extension 0 0  Ankle dorsiflexion 20 0   Ankle plantarflexion 35 0  Ankle inversion    Ankle eversion     (Blank rows = not tested)  LLE limited due to weakness Updated MMT taken 08/05/2023 MMT:  L ankle dorsiflexion 3-/5  L ankle eversion 3-/5  L ankle plantar flexion3-/5  L hamstrings 3+/5  L quads 4/5  LOWER EXTREMITY MMT:    MMT Right Eval Left Eval  Hip flexion 5 3+  Hip extension    Hip abduction 5 3  Hip adduction 5 5  Hip internal rotation    Hip external rotation    Knee flexion 5 3-  Knee extension 5 3+  Ankle dorsiflexion 5 0  Ankle plantarflexion 5 0  Ankle inversion 5 0  Ankle eversion 5 0  (Blank rows = not tested)  BED MOBILITY:  indep  TRANSFERS: Assistive device utilized: Environmental consultant - 2 wheeled  Sit to stand: Complete Independence and Modified independence Stand to sit: Complete Independence and Modified independence Chair to chair: Modified independence Floor:  NT  RAMP:  Level of Assistance: SBA Assistive device utilized: Environmental consultant - 2 wheeled Ramp Comments:   CURB:  Level of Assistance: SBA and CGA Assistive device utilized: Environmental consultant - 2 wheeled Curb Comments:   STAIRS: Level of Assistance: Modified independence and SBA Stair Negotiation Technique: Step to Pattern with Bilateral Rails Number of Stairs: 12  Height of Stairs: 4-6"  Comments: pt negotiating 38 steps to her apartment daily  GAIT: Gait pattern: step to pattern Distance walked:  Assistive device utilized: Environmental consultant - 2 wheeled Level of assistance: Modified independence Comments: use of fixed left AFO  FUNCTIONAL TESTS:  5 times sit to stand: 25 sec Timed up and go (TUG): 30 sec w/ RW and left AFO Berg Balance Scale: 42/56 Gait speed: TBD  TODAY'S TREATMENT:  DATE: 06/23/23    PATIENT EDUCATION: Education details: assessment details, HEP initiated Person educated: Patient Education method:  Chief Technology Officer Education comprehension: needs further education  HOME EXERCISE PROGRAM: Access Code: [email or web address] URL: [email or web address] Date: 06/23/2023 Prepared by: Shary Decamp  Exercises - Seated Hamstring Curls with Resistance  - 1 x daily - 7 x weekly - 3 sets - 10 reps - Seated Knee Extension with Resistance  - 1 x daily - 7 x weekly - 3 sets - 10 reps - Side Stepping with Resistance at Ankles and Counter Support  - 1 x daily - 7 x weekly - 1-3 sets - 1-2 minute rounds hold  GOALS: Goals reviewed with patient? Yes  SHORT TERM GOALS: Target date: 07/21/2023    Patient will be independent in HEP to improve functional outcomes Baseline: Goal status: MET  2.  Demo improved BLE strength and dynamic balance as evidenced by 15 sec 5xSTS test Baseline: 25 sec>13.5 sec Goal status: MET, 07/19/2023  3.  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  Demo ambulation speed 2.8 ft/sec to improve efficiency of community ambulation Baseline: TBD 2.09 ft/sec with cane (07/19/23)>2.69 ft/sec 08/18/2023 Goal status: PARTIALLY MET 08/18/2023  3.  Demo low risk for falls per score 50/56 Berg Balance Test Baseline: 42/56>52/56 08/18/2023 Goal status: MET 08/18/2023  4.  Demo low risk for falls per time 12 sec TUG test Baseline: 30 sec w/ RW>14.53 sec with cane; 17.13 sec with no device 07/19/2023>12.78 sec no cane 08/18/2023 Goal status: MET 08/18/2023  5.  Demo modified independent stair ambulation w/ reciprocal  pattern to improve safety and efficiency with entering/exiting home environment Baseline: step-to pattern bilat HR>alt step pattern with and without rail Goal status: MET 08/18/2023  (UPDATED) LONG TERM GOALS: Target date: 10/01/2023  Pt will be independent with progression of HEP for improved strength, balance, transfers, and gait. Baseline: last update 08/16/2023 Goal status: INITIAL  2.  Pt will improve FGA score to at least 22/30 to demonstrate improved functional strength and transfer efficiency. Baseline: 19/30 Goal status: INITIAL  3.  Pt will improve to at least 1000 ft  for improved gait and return to outdoor walking at park. Baseline: 850 ft Goal status: INITIAL  4.  Pt will improve gait velocity to at least 3 ft/sec for improved gait efficiency and safety. Baseline: 2.69 ft/sec Goal status: INITIAL    ASSESSMENT:  CLINICAL IMPRESSION: Assessed LTGs this visit, with pt meeting 4 of 5 LTGs.  She has met LTG 1 for gait mod I with cane; she has met LTG 3 for improved Berg score and LTG 4 for improved TUG score.  She has met LTG 5 for stair negotiation.  LTG 2 partially met, with improved gait velocity, just not to goal level.  She is improving with ankle strength in LLE; she is at fall risk per FGA score of 19/30 and she has decreased distance for 6 MWT (no brace, no cane) at 850 ft.  She would benefit from further skilled PT to further progress strength and  balance for improved gait and independent mobility. OBJECTIVE IMPAIRMENTS: Abnormal gait, decreased activity tolerance, decreased balance, decreased coordination, decreased endurance, decreased knowledge of use of DME, decreased mobility, difficulty walking, decreased ROM, decreased strength, and impaired tone.   ACTIVITY LIMITATIONS: carrying, lifting, stairs, transfers, and locomotion level  PARTICIPATION LIMITATIONS: meal prep, cleaning, laundry, driving, shopping, community activity, and occupation  PERSONAL  FACTORS: Time since onset of injury/illness/exacerbation and 1 comorbidity: PMH  are also affecting patient's functional outcome.   REHAB POTENTIAL: Excellent  CLINICAL DECISION MAKING: Evolving/moderate complexity  EVALUATION COMPLEXITY: Moderate  PLAN:  PT FREQUENCY: 1x/week  PT DURATION: 6 weeks (per recert 08/18/2023)  PLANNED INTERVENTIONS: Therapeutic exercises, Therapeutic activity, Neuromuscular re-education, Balance training, Gait training, Patient/Family education, Self Care, Joint mobilization, Stair training, Vestibular training, Canalith repositioning, Orthotic/Fit training, DME instructions, Aquatic Therapy, Dry Needling, Electrical stimulation, Spinal mobilization, Cryotherapy, Moist heat, Taping, Ionotophoresis 4mg /ml Dexamethasone, and Manual therapy  PLAN FOR NEXT SESSION: Gait and balance work without AFO; continue hip and ankle strengthening LLE; consider compliant surfaces to work ankle strength; consolidate HEP at some point.   Lonia Blood, PT 08/18/23 5:30 PM Phone: [phone] Fax: [phone]   [facility] at [facility] [address] [place], [address] [place], [place] [address] Phone # [phone] Fax # [phone]

## 2023-08-20 NOTE — Therapy (Signed)
OUTPATIENT PHYSICAL THERAPY NEURO TREATMENT   Patient Name: Alisha Thomas MRN: [id number] DOB:Mar 24, 1971, 52 y.o., female Today's Date: 08/23/2023   PCP: Jordan Hawks, PA-C REFERRING PROVIDER: Carrolyn Leigh, FNP  END OF SESSION:  PT End of Session - 08/23/23 1615     Visit Number 14    Number of Visits 19    Date for PT Re-Evaluation 10/01/23    Authorization Type [employer] Medicaid-auth not required    Authorization - Visit Number 14    Authorization - Number of Visits 27   combined (but not getting OT)   PT Start Time 1535    PT Stop Time 1614    PT Time Calculation (min) 39 min    Equipment Utilized During Treatment Gait belt    Activity Tolerance Patient tolerated treatment well    Behavior During Therapy WFL for tasks assessed/performed                       Past Medical History:  Diagnosis Date   Hypertension    Ovarian cyst    Pelvic adhesions    Stomach ulcer    Past Surgical History:  Procedure Laterality Date   BREAST CYST ASPIRATION Left 2021   ECTOPIC PREGNANCY SURGERY     OVARIAN CYST SURGERY     PELVIC LAPAROSCOPY  11/30/2006   Diag Lap lysis of adhesions   Patient Active Problem List   Diagnosis Date Noted   Symptomatic anemia 03/26/2020   Iron deficiency anemia due to chronic blood loss 02/24/2018   Fibroids 07/04/2015   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   Peptic ulcer 09/10/2011   Ovarian cyst    Pelvic adhesions     ONSET DATE: 04/22/23  REFERRING DIAG: Z74.09 (ICD-10-CM) - Other reduced mobility Z78.9 (ICD-10-CM) - Other specified health status 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   Pt accompanied by: self  PERTINENT HISTORY: hx of meningoma   PAIN:  Are you having pain? No  PRECAUTIONS: Fall  RED FLAGS: None   WEIGHT BEARING RESTRICTIONS: No  FALLS: Has patient fallen in last  6 months? No  LIVING ENVIRONMENT: Lives with: lives with their family Lives in: House/apartment Stairs: Yes: External: 38 steps; can reach both Has following equipment at home: Walker - 2 wheeled, shower seat  PLOF: Independent with basic ADLs and Independent with household mobility with device Works in schools as Runner, broadcasting/film/video PATIENT GOALS: improve walking  OBJECTIVE:    TODAY'S TREATMENT: 08/23/23 Activity Comments  TM walking 2.0 mph x 6 min Slight L circumduction and limited L knee flexion during swing   fwd/back stepping over hurdle 10x each on firm, then foam surface Used taller hurdle for L foot stepping to prevent circumduction; occasional UE support required   1/2 tandem on foam + head turns/nods 30" each More imbalance with L foot back   Sidestepping with yellow loop around forefoot 2x85ft Good focus to maintain ankles/hips neutral   toe tap to 3 cones Good improvement in wt shift        HOME EXERCISE PROGRAM Last updated: 08/16/23 Access Code: [email or web address] URL: [email or web address] Date: 08/16/2023 Prepared by: [facility] - Outpatient  Rehab - [facility]  Exercises - Side Stepping with Resistance at Ankles and Counter Support  - 1 x daily - 7 x weekly - 1-3 sets - 1-2 minute rounds hold - Staggered Sit-to-Stand  - 1 x daily - 5 x weekly - 2 sets - 10 reps - Standing Toe Taps  - 1 x daily - 5 x weekly - 2 sets - 10 reps - Standing Gastroc Stretch at Counter  - 1 x daily - 7 x weekly - 1 sets - 3 reps - 30 sec hold - Mini Squat  - 1 x daily -  5 x weekly - 2 sets - 10 reps - Narrow Stance with Counter Support  - 1 x daily - 7 x weekly - 1 sets - 10 reps - Single Leg Heel Raise with Counter Support  - 1 x daily - 5 x weekly - 3 sets - 10 reps - 3 sec hold - Backward Walking with Counter Support  - 1 x daily - 5 x weekly - 1 sets - 5 reps - Standing Hip Abduction with Ankle Weight  - 1 x daily - 4 x weekly - 3 sets - 10 reps - Standing Hip Extension with Ankle Weight  - 1 x daily - 4 x weekly - 3 sets - 10 reps - Standing Knee Flexion with Ankle Weight  - 1 x daily - 4 x weekly - 3 sets - 10 reps - Long Sitting Ankle Inversion with Resistance  - 1 x daily - 5 x weekly - 2 sets - 10 reps - Long Sitting Ankle Eversion with Resistance  - 1 x daily - 5 x weekly - 2 sets - 10 reps - Seated Ankle Dorsiflexion with Resistance  - 1 x daily - 5 x weekly - 2 sets - 10 reps - Standing Eccentric Heel Raise  - 1 x daily - 5 x weekly - 2 sets - 10 reps - Single Leg Stance + toe tap to 2 cones - 1 x daily - 5 x weekly - 2 sets - 10 reps    Below measures were taken at time of initial evaluation unless otherwise specified:   DIAGNOSTIC FINDINGS: surgical procedure  COGNITION: Overall cognitive status: Within functional limits for tasks assessed   SENSATION: WFL  COORDINATION: Impaired LLE due to weakness  EDEMA:  none  MUSCLE TONE: LLE: Flaccid dorsiflexors/toe extensors, plantarflexors. Left hamstrings   MUSCLE  LENGTH: WNL, developing some tightness in left ankle dorsiflexion due to paralysis  DTRs:  NT  POSTURE: No Significant postural limitations  LOWER EXTREMITY ROM:     Active  Right Eval Left Eval  Hip flexion 120 100  Hip extension    Hip abduction    Hip adduction    Hip internal rotation    Hip external rotation    Knee flexion 120 120  Knee extension 0 0  Ankle dorsiflexion 20 0  Ankle plantarflexion 35 0  Ankle inversion    Ankle eversion     (Blank rows = not tested)  LLE limited due to  weakness Updated MMT taken 08/05/2023 MMT:  L ankle dorsiflexion 3-/5  L ankle eversion 3-/5  L ankle plantar flexion3-/5  L hamstrings 3+/5  L quads 4/5  LOWER EXTREMITY MMT:    MMT Right Eval Left Eval  Hip flexion 5 3+  Hip extension    Hip abduction 5 3  Hip adduction 5 5  Hip internal rotation    Hip external rotation    Knee flexion 5 3-  Knee extension 5 3+  Ankle dorsiflexion 5 0  Ankle plantarflexion 5 0  Ankle inversion 5 0  Ankle eversion 5 0  (Blank rows = not tested)  BED MOBILITY:  indep  TRANSFERS: Assistive device utilized: Environmental consultant - 2 wheeled  Sit to stand: Complete Independence and Modified independence Stand to sit: Complete Independence and Modified independence Chair to chair: Modified independence Floor:  NT  RAMP:  Level of Assistance: SBA Assistive device utilized: Environmental consultant - 2 wheeled Ramp Comments:   CURB:  Level of Assistance: SBA and CGA Assistive device utilized: Environmental consultant - 2 wheeled Curb Comments:   STAIRS: Level of Assistance: Modified independence and SBA Stair Negotiation Technique: Step to Pattern with Bilateral Rails Number of Stairs: 12  Height of Stairs: 4-6"  Comments: pt negotiating 38 steps to her apartment daily  GAIT: Gait pattern: step to pattern Distance walked:  Assistive device utilized: Environmental consultant - 2 wheeled Level of assistance: Modified independence Comments: use of fixed left AFO  FUNCTIONAL TESTS:  5 times sit to stand: 25 sec Timed up and go (TUG): 30 sec w/ RW and left AFO Berg Balance Scale: 42/56 Gait speed: TBD  TODAY'S TREATMENT:                                                                                                                              DATE: 06/23/23    PATIENT EDUCATION: Education details: assessment details, HEP initiated Person educated: Patient Education method: Chief Technology Officer Education comprehension: needs further education  HOME EXERCISE PROGRAM: Access Code:  [email or web address] URL: [email or web address] Date: 06/23/2023 Prepared by: Shary Decamp  Exercises - Seated Hamstring Curls with Resistance  - 1 x daily - 7 x weekly - 3 sets - 10 reps - Seated Knee Extension with Resistance  - 1 x daily - 7 x weekly - 3  sets - 10 reps - Side Stepping with Resistance at Ankles and Counter Support  - 1 x daily - 7 x weekly - 1-3 sets - 1-2 minute rounds hold  GOALS: Goals reviewed with patient? Yes  SHORT TERM GOALS: Target date: 07/21/2023    Patient will be independent in HEP to improve functional outcomes Baseline: Goal status: MET  2.  Demo improved BLE strength and dynamic balance as evidenced by 15 sec 5xSTS test Baseline: 25 sec>13.5 sec Goal status: MET, 07/19/2023  3.  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  Demo ambulation speed 2.8 ft/sec to improve efficiency of community ambulation Baseline: TBD 2.09 ft/sec with cane (07/19/23)>2.69 ft/sec 08/18/2023 Goal status: PARTIALLY MET 08/18/2023  3.  Demo low risk for falls per score 50/56 Berg Balance Test Baseline: 42/56>52/56 08/18/2023 Goal status: MET 08/18/2023  4.  Demo low risk for falls per time 12 sec TUG test Baseline: 30 sec w/ RW>14.53 sec with cane; 17.13 sec with no device 07/19/2023>12.78 sec no cane 08/18/2023 Goal status: MET 08/18/2023  5.  Demo modified independent stair ambulation w/ reciprocal pattern to improve safety and efficiency with entering/exiting home environment Baseline: step-to pattern bilat  HR>alt step pattern with and without rail Goal status: MET 08/18/2023  (UPDATED) LONG TERM GOALS: Target date: 10/01/2023  Pt will be independent with progression of HEP for improved strength, balance, transfers, and gait. Baseline: last update 08/16/2023 Goal status: IN PROGRESS  2.  Pt will improve FGA score to at least 22/30 to demonstrate improved functional strength and transfer efficiency. Baseline: 19/30 Goal status: IN PROGRESS  3.  Pt will improve to at least 1000 ft  for improved gait and return to outdoor walking at park. Baseline: 850 ft Goal status: IN PROGRESS  4.  Pt will improve gait velocity to at least 3 ft/sec for improved gait efficiency and safety. Baseline: 2.69 ft/sec Goal status: IN PROGRESS    ASSESSMENT:  CLINICAL IMPRESSION: Patient arrived to session without complaints. Gait training on TM revealed remaining L hip circumduction and reduced L knee and hip flexion. Good focus on correcting circumduction with activities stepping over hurdles. Ankle instability was evident with standing on compliant surfaces- also worked on resisted sidestepping to further engage hips and ankles. Patient is demonstrating improved form with many activities. No complaints upon leaving.  OBJECTIVE IMPAIRMENTS: Abnormal gait, decreased activity tolerance, decreased balance, decreased coordination, decreased endurance, decreased knowledge of use of DME, decreased mobility, difficulty walking, decreased ROM, decreased strength, and impaired tone.   ACTIVITY LIMITATIONS: carrying, lifting, stairs, transfers, and locomotion level  PARTICIPATION LIMITATIONS: meal prep, cleaning, laundry, driving, shopping, community activity, and occupation  PERSONAL FACTORS: Time since onset of injury/illness/exacerbation and 1 comorbidity: PMH  are also affecting patient's functional outcome.   REHAB POTENTIAL: Excellent  CLINICAL DECISION MAKING: Evolving/moderate complexity  EVALUATION  COMPLEXITY: Moderate  PLAN:  PT FREQUENCY: 1x/week  PT DURATION: 6 weeks (per recert 08/18/2023)  PLANNED INTERVENTIONS: Therapeutic exercises, Therapeutic activity, Neuromuscular re-education, Balance training, Gait training, Patient/Family education, Self Care, Joint mobilization,  Stair training, Vestibular training, Canalith repositioning, Orthotic/Fit training, DME instructions, Aquatic Therapy, Dry Needling, Electrical stimulation, Spinal mobilization, Cryotherapy, Moist heat, Taping, Ionotophoresis 4mg /ml Dexamethasone, and Manual therapy  PLAN FOR NEXT SESSION: Gait and balance work without AFO; continue hip and ankle strengthening LLE; consider compliant surfaces to work ankle strength; consolidate HEP at some point.   Anette Guarneri, PT, DPT 08/23/23 4:18 PM  [facility] [address] [place], [address] [place], [place] [address] Phone # [phone] Fax # [phone]

## 2023-08-23 ENCOUNTER — Ambulatory Visit: Payer: Medicaid Other | Admitting: Physical Therapy

## 2023-08-23 ENCOUNTER — Encounter: Payer: Self-pay | Admitting: Physical Therapy

## 2023-08-23 DIAGNOSIS — M6281 Muscle weakness (generalized): Secondary | ICD-10-CM | POA: Diagnosis not present

## 2023-08-23 DIAGNOSIS — R262 Difficulty in walking, not elsewhere classified: Secondary | ICD-10-CM

## 2023-08-23 DIAGNOSIS — R2689 Other abnormalities of gait and mobility: Secondary | ICD-10-CM

## 2023-08-23 DIAGNOSIS — R2681 Unsteadiness on feet: Secondary | ICD-10-CM

## 2023-08-31 NOTE — Therapy (Signed)
OUTPATIENT PHYSICAL THERAPY NEURO TREATMENT   Patient Name: Alisha Thomas MRN: [id number] DOB:07-22-1971, 52 y.o., female Today's Date: 09/01/2023   PCP: Jordan Hawks, PA-C REFERRING PROVIDER: Carrolyn Leigh, FNP  END OF SESSION:  PT End of Session - 09/01/23 1615     Visit Number 15    Number of Visits 19    Date for PT Re-Evaluation 10/01/23    Authorization Type [employer] Medicaid-auth not required    Authorization - Visit Number 15    Authorization - Number of Visits 27   combined (but not getting OT)   PT Start Time 1535    PT Stop Time 1614    PT Time Calculation (min) 39 min    Equipment Utilized During Treatment Gait belt    Activity Tolerance Patient tolerated treatment well    Behavior During Therapy WFL for tasks assessed/performed                        Past Medical History:  Diagnosis Date   Hypertension    Ovarian cyst    Pelvic adhesions    Stomach ulcer    Past Surgical History:  Procedure Laterality Date   BREAST CYST ASPIRATION Left 2021   ECTOPIC PREGNANCY SURGERY     OVARIAN CYST SURGERY     PELVIC LAPAROSCOPY  11/30/2006   Diag Lap lysis of adhesions   Patient Active Problem List   Diagnosis Date Noted   Symptomatic anemia 03/26/2020   Iron deficiency anemia due to chronic blood loss 02/24/2018   Fibroids 07/04/2015   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   Peptic ulcer 09/10/2011   Ovarian cyst    Pelvic adhesions     ONSET DATE: 04/22/23  REFERRING DIAG: Z74.09 (ICD-10-CM) - Other reduced mobility Z78.9 (ICD-10-CM) - Other specified health status 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 (ICD-10-CM) - Benign neoplasm of cerebral meninges Z98.890 (ICD-10-CM) - Other specified postprocedural states  THERAPY DIAG:  Other abnormalities of gait and mobility  Unsteadiness on feet  Muscle weakness (generalized)  Difficulty in walking, not elsewhere classified  Rationale for Evaluation and Treatment: Rehabilitation  SUBJECTIVE:                                                                                                                                                                                              SUBJECTIVE STATEMENT: "I was so lazy last week I could barely do 1 exercise." Reports some L ankle swelling after she exercises but it is better than it usually is. Denies pain.   Pt accompanied by: self  PERTINENT HISTORY: hx of meningoma   PAIN:  Are you having pain? No  PRECAUTIONS: Fall  RED FLAGS: None   WEIGHT BEARING RESTRICTIONS: No  FALLS: Has patient fallen in last 6 months? No  LIVING ENVIRONMENT: Lives with: lives with their family Lives in: House/apartment Stairs: Yes: External: 38 steps; can reach both Has following equipment at home: Walker - 2 wheeled, shower seat  PLOF: Independent with basic ADLs and Independent with household mobility with device Works in schools as Runner, broadcasting/film/video PATIENT GOALS: improve walking  OBJECTIVE:     TODAY'S TREATMENT: 09/01/23 Activity Comments  gait training outside on grass, hills, sidewalk, curbs, ramps without AD 0.33 miles; very slight sway/wobble with walking on grass but pt safe  Jogging and hop in place/hop over line on sidewalk  Cues to use B LE symmetrically, stay on toes, longer R foot step ; performed without AFO  stair navigation holding black medball, then holding box, then box holding black medball Safe and good control   SLS + medball toss Required "tripod stance" on L LE but able to perform SLS on R      HOME EXERCISE PROGRAM Access Code: [email or web address] URL: [email or web address] Date: 09/01/2023 Prepared by: [facility] - [facility]  Exercises - Side Stepping with Resistance at Ankles and Counter Support  - 1 x daily - 7 x weekly - 1-3 sets - 1-2 minute rounds hold - Staggered Sit-to-Stand  - 1 x daily - 5 x weekly - 2 sets - 10 reps - Mini Squat  - 1 x daily - 5 x weekly - 2 sets - 10 reps - Standing Hip Abduction with  Ankle Weight  - 1 x daily - 4 x weekly - 3 sets - 10 reps - Standing Hip Extension with Ankle Weight  - 1 x daily - 4 x weekly - 3 sets - 10 reps - Standing Knee Flexion with Ankle Weight  - 1 x daily - 4 x weekly - 3 sets - 10 reps - Standing Eccentric Heel Raise  - 1 x daily - 5 x weekly - 2 sets - 10 reps - Single Leg Stance  - 1 x daily - 5 x weekly - 2 sets - 10 reps - Standing Gastroc Stretch at Counter  - 1 x daily - 7 x weekly - 1 sets - 3 reps - 30 sec hold - Long Sitting Ankle Inversion with Resistance  - 1 x daily - 5 x weekly - 2 sets - 10 reps - Long Sitting Ankle Eversion with Resistance  - 1 x daily - 5 x weekly - 2 sets - 10 reps - Seated Ankle Dorsiflexion with Resistance  - 1 x daily - 5 x weekly - 2 sets - 10 reps   PATIENT EDUCATION: Education details: HEP update and consolidation and divided exercises into 3 groups for improved compliance  Person educated: Patient Education method: Explanation, Demonstration, Tactile cues, Verbal cues, and Handouts Education comprehension: verbalized understanding and returned demonstration     Below measures were taken at time of initial evaluation unless otherwise specified:   DIAGNOSTIC FINDINGS: surgical procedure  COGNITION: Overall cognitive status: Within functional limits for tasks assessed   SENSATION: WFL  COORDINATION: Impaired LLE due to weakness  EDEMA:  none  MUSCLE TONE: LLE: Flaccid dorsiflexors/toe extensors, plantarflexors. Left hamstrings   MUSCLE LENGTH: WNL, developing some tightness in left ankle dorsiflexion due to paralysis  DTRs:  NT  POSTURE: No Significant postural limitations  LOWER EXTREMITY ROM:     Active  Right Eval Left Eval  Hip flexion 120 100  Hip extension    Hip abduction    Hip adduction    Hip internal rotation    Hip external rotation    Knee flexion 120 120  Knee extension 0 0  Ankle dorsiflexion 20 0  Ankle plantarflexion 35 0  Ankle inversion    Ankle  eversion     (Blank rows = not tested)  LLE limited due to weakness Updated MMT taken 08/05/2023 MMT:  L ankle dorsiflexion 3-/5  L ankle eversion 3-/5  L ankle plantar flexion3-/5  L hamstrings 3+/5  L quads 4/5  LOWER EXTREMITY MMT:    MMT Right Eval Left Eval  Hip flexion 5 3+  Hip extension    Hip abduction 5 3  Hip adduction 5 5  Hip internal rotation    Hip external rotation    Knee flexion 5 3-  Knee extension 5 3+  Ankle dorsiflexion 5 0  Ankle plantarflexion 5 0  Ankle inversion 5 0  Ankle eversion 5 0  (Blank rows = not tested)  BED MOBILITY:  indep  TRANSFERS: Assistive device utilized: Environmental consultant - 2 wheeled  Sit to stand: Complete Independence and Modified independence Stand to sit: Complete Independence and Modified independence Chair to chair: Modified independence Floor:  NT  RAMP:  Level of Assistance: SBA Assistive device utilized: Environmental consultant - 2 wheeled Ramp Comments:   CURB:  Level of Assistance: SBA and CGA Assistive device utilized: Environmental consultant - 2 wheeled Curb Comments:   STAIRS: Level of Assistance: Modified independence and SBA Stair Negotiation Technique: Step to Pattern with Bilateral Rails Number of Stairs: 12  Height of Stairs: 4-6"  Comments: pt negotiating 38 steps to her apartment daily  GAIT: Gait pattern: step to pattern Distance walked:  Assistive device utilized: Environmental consultant - 2 wheeled Level of assistance: Modified independence Comments: use of fixed left AFO  FUNCTIONAL TESTS:  5 times sit to stand: 25 sec Timed up and go (TUG): 30 sec w/ RW and left AFO Berg Balance Scale: 42/56 Gait speed: TBD  TODAY'S TREATMENT:                                                                                                                              DATE: 06/23/23    PATIENT EDUCATION: Education details: assessment details, HEP initiated Person educated: Patient Education method: Chief Technology Officer Education comprehension: needs  further education  HOME EXERCISE PROGRAM: Access Code: [email or web address] URL: [email or web address] Date: 06/23/2023 Prepared by: Shary Decamp  Exercises - Seated Hamstring Curls with Resistance  - 1 x daily - 7 x weekly - 3 sets - 10 reps - Seated Knee Extension with Resistance  - 1 x daily - 7 x weekly - 3 sets - 10 reps - Side Stepping with Resistance at Ankles and Counter Support  - 1 x daily - 7 x weekly - 1-3 sets - 1-2 minute rounds hold  GOALS: Goals reviewed with patient? Yes  SHORT TERM GOALS: Target date: 07/21/2023    Patient will be independent in HEP to improve functional outcomes Baseline: Goal status: MET  2.  Demo improved BLE strength and dynamic balance as evidenced by 15 sec 5xSTS test Baseline: 25 sec>13.5 sec Goal status: MET, 07/19/2023  3.  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  Demo ambulation speed 2.8 ft/sec to improve efficiency of community ambulation Baseline: TBD 2.09 ft/sec with cane (07/19/23)>2.69 ft/sec 08/18/2023 Goal status: PARTIALLY MET 08/18/2023  3.  Demo low risk for falls per score 50/56 Berg Balance Test Baseline: 42/56>52/56 08/18/2023 Goal status: MET 08/18/2023  4.  Demo low risk for falls per time 12 sec TUG test Baseline: 30 sec w/ RW>14.53 sec with cane; 17.13 sec with no device 07/19/2023>12.78 sec no cane 08/18/2023 Goal status: MET 08/18/2023  5.  Demo modified independent stair ambulation w/ reciprocal pattern to improve safety and efficiency with  entering/exiting home environment Baseline: step-to pattern bilat HR>alt step pattern with and without rail Goal status: MET 08/18/2023  (UPDATED) LONG TERM GOALS: Target date: 10/01/2023  Pt will be independent with progression of HEP for improved strength, balance, transfers, and gait. Baseline: last update 08/16/2023 Goal status: IN PROGRESS  2.  Pt will improve FGA score to at least 22/30 to demonstrate improved functional strength and transfer efficiency. Baseline: 19/30 Goal status: IN PROGRESS  3.  Pt will improve to at least 1000 ft  for improved gait and return to outdoor walking at park. Baseline: 850 ft Goal status: IN PROGRESS  4.  Pt will improve gait velocity to at least 3 ft/sec for improved gait efficiency and safety. Baseline: 2.69 ft/sec Goal status: IN PROGRESS    ASSESSMENT:  CLINICAL IMPRESSION: Patient arrived to session without complaints. Introduced jogging and plyometrics today with cueing for symmetrical use of Les. Also observed gait on uneven surfaces which patient appeared safe and stable. Also with good stability navigating stairs while lifting large items which occludes visual field somewhat. Some remaining imbalance with L SLS but patient is showing improvement. No complaints at end of session.  OBJECTIVE IMPAIRMENTS: Abnormal gait, decreased activity tolerance, decreased balance, decreased coordination, decreased endurance, decreased knowledge of use of DME, decreased mobility, difficulty walking, decreased ROM, decreased strength, and impaired tone.   ACTIVITY LIMITATIONS: carrying, lifting, stairs, transfers, and locomotion level  PARTICIPATION LIMITATIONS: meal prep, cleaning, laundry, driving, shopping, community activity, and occupation  PERSONAL FACTORS: Time since onset of injury/illness/exacerbation and 1 comorbidity: PMH  are also affecting patient's functional outcome.   REHAB POTENTIAL: Excellent  CLINICAL DECISION MAKING:  Evolving/moderate complexity  EVALUATION COMPLEXITY: Moderate  PLAN:  PT FREQUENCY: 1x/week  PT DURATION: 6 weeks (per recert 08/18/2023)  PLANNED INTERVENTIONS: Therapeutic exercises, Therapeutic activity, Neuromuscular re-education, Balance training, Gait training, Patient/Family education, Self Care, Joint mobilization, Stair training, Vestibular training, Canalith repositioning, Orthotic/Fit training, DME instructions, Aquatic Therapy, Dry Needling, Electrical stimulation, Spinal mobilization, Cryotherapy, Moist heat, Taping, Ionotophoresis 4mg /ml Dexamethasone, and Manual therapy  PLAN FOR NEXT SESSION: Gait  and balance work without AFO; continue hip and ankle strengthening LLE; consider compliant surfaces to work ankle strength; consolidate HEP at some point.   Anette Guarneri, PT, DPT 09/01/23 4:16 PM  [facility] at [facility] [address] [place], [address] [place], [place] [address] Phone # [phone] Fax # [phone]

## 2023-09-01 ENCOUNTER — Encounter: Payer: Self-pay | Admitting: Physical Therapy

## 2023-09-01 ENCOUNTER — Ambulatory Visit: Payer: Medicaid Other | Attending: Nurse Practitioner | Admitting: Physical Therapy

## 2023-09-01 DIAGNOSIS — R262 Difficulty in walking, not elsewhere classified: Secondary | ICD-10-CM | POA: Diagnosis present

## 2023-09-01 DIAGNOSIS — R2689 Other abnormalities of gait and mobility: Secondary | ICD-10-CM

## 2023-09-01 DIAGNOSIS — M6281 Muscle weakness (generalized): Secondary | ICD-10-CM

## 2023-09-01 DIAGNOSIS — R2681 Unsteadiness on feet: Secondary | ICD-10-CM | POA: Diagnosis present

## 2023-09-07 NOTE — Therapy (Signed)
OUTPATIENT PHYSICAL THERAPY NEURO TREATMENT   Patient Name: Alisha Thomas MRN: [id number] DOB:October 28, 1971, 52 y.o., female Today's Date: 09/08/2023   PCP: Jordan Hawks, PA-C REFERRING PROVIDER: Carrolyn Leigh, FNP  END OF SESSION:  PT End of Session - 09/08/23 1611     Visit Number 16    Number of Visits 19    Date for PT Re-Evaluation 10/01/23    Authorization Type [employer] Medicaid-auth not required    Authorization - Number of Visits 27   combined (but not getting OT)   PT Start Time 1535    PT Stop Time 1609    PT Time Calculation (min) 34 min    Equipment Utilized During Treatment Gait belt    Activity Tolerance Patient tolerated treatment well    Behavior During Therapy WFL for tasks assessed/performed                         Past Medical History:  Diagnosis Date   Hypertension    Ovarian cyst    Pelvic adhesions    Stomach ulcer    Past Surgical History:  Procedure Laterality Date   BREAST CYST ASPIRATION Left 2021   ECTOPIC PREGNANCY SURGERY     OVARIAN CYST SURGERY     PELVIC LAPAROSCOPY  11/30/2006   Diag Lap lysis of adhesions   Patient Active Problem List   Diagnosis Date Noted   Symptomatic anemia 03/26/2020   Iron deficiency anemia due to chronic blood loss 02/24/2018   Fibroids 07/04/2015   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   Peptic ulcer 09/10/2011   Ovarian cyst    Pelvic adhesions     ONSET DATE: 04/22/23  REFERRING DIAG: Z74.09 (ICD-10-CM) - Other reduced mobility Z78.9 (ICD-10-CM) - Other specified health status 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 Took the AFO off in the car.   Pt accompanied by: self  PERTINENT HISTORY: hx of meningoma   PAIN:  Are you having pain? No  PRECAUTIONS: Fall  RED FLAGS: None   WEIGHT BEARING RESTRICTIONS: No  FALLS: Has patient fallen  in last 6 months? No  LIVING ENVIRONMENT: Lives with: lives with their family Lives in: House/apartment Stairs: Yes: External: 38 steps; can reach both Has following equipment at home: Walker - 2 wheeled, shower seat  PLOF: Independent with basic ADLs and Independent with household mobility with device Works in schools as Runner, broadcasting/film/video PATIENT GOALS: improve walking  OBJECTIVE:    TODAY'S TREATMENT: 09/08/23 Activity Comments  Jogging on sidewalk ~215ft Cueing for increased push off from L foot and longer R step; good improvement in speed; occasional L foot catching   fwd/back hops over line in tile Good use of arm swing; more difficulty towards R  R/L hops over line in tile Difficulty hopping for distance in posterior direction   thread the needle, child's pose 5x each Good form  table top to forward lunge , alternating LEs Required some use of hands to position L foot   Tall lunge >warrior 2 > triangle >low lunge Some instability in warrior 2; required cues for foot positioning and guarding for balance; difficulty tucking L toes under for tall lunge       HOME EXERCISE PROGRAM Access Code: [email or web address] URL: [email or web address] Date: 09/01/2023 Prepared by: [facility] - Outpatient  Rehab - [facility]  Exercises - Side Stepping with Resistance at Ankles and Counter Support  - 1 x daily - 7 x weekly - 1-3 sets - 1-2 minute rounds hold - Staggered Sit-to-Stand  - 1 x daily - 5 x weekly - 2 sets - 10 reps - Mini Squat  - 1 x daily - 5 x weekly - 2 sets - 10 reps -  Standing Hip Abduction with Ankle Weight  - 1 x daily - 4 x weekly - 3 sets - 10 reps - Standing Hip Extension with Ankle Weight  - 1 x daily - 4 x weekly - 3 sets - 10 reps - Standing Knee Flexion with Ankle Weight  - 1 x daily - 4 x weekly - 3 sets - 10 reps - Standing Eccentric Heel Raise  - 1 x daily - 5 x weekly - 2 sets - 10 reps - Single Leg Stance  - 1 x daily - 5 x weekly - 2 sets - 10 reps - Standing Gastroc Stretch at Counter  - 1 x daily - 7 x weekly - 1 sets - 3 reps - 30 sec hold - Long Sitting Ankle Inversion with Resistance  - 1 x daily - 5 x weekly - 2 sets - 10 reps - Long Sitting Ankle Eversion with Resistance  - 1 x daily - 5 x weekly - 2 sets - 10 reps - Seated Ankle Dorsiflexion with Resistance  - 1 x daily - 5 x weekly - 2 sets - 10 reps   PATIENT EDUCATION: Education details: HEP update and consolidation and divided exercises into 3 groups for improved compliance  Person educated: Patient Education method: Explanation, Demonstration, Tactile cues, Verbal cues, and Handouts Education comprehension: verbalized understanding and returned demonstration     Below measures were taken at time of initial evaluation unless otherwise specified:   DIAGNOSTIC FINDINGS: surgical procedure  COGNITION: Overall cognitive status: Within functional limits for tasks assessed   SENSATION: WFL  COORDINATION: Impaired LLE due to weakness  EDEMA:  none  MUSCLE TONE: LLE: Flaccid dorsiflexors/toe extensors, plantarflexors. Left hamstrings   MUSCLE LENGTH: WNL, developing some tightness in left ankle dorsiflexion due to paralysis  DTRs:  NT  POSTURE: No Significant postural limitations  LOWER EXTREMITY ROM:  Active  Right Eval Left Eval  Hip flexion 120 100  Hip extension    Hip abduction    Hip adduction    Hip internal rotation    Hip external rotation    Knee flexion 120 120  Knee extension 0 0  Ankle dorsiflexion 20 0  Ankle plantarflexion 35 0   Ankle inversion    Ankle eversion     (Blank rows = not tested)  LLE limited due to weakness Updated MMT taken 08/05/2023 MMT:  L ankle dorsiflexion 3-/5  L ankle eversion 3-/5  L ankle plantar flexion3-/5  L hamstrings 3+/5  L quads 4/5  LOWER EXTREMITY MMT:    MMT Right Eval Left Eval  Hip flexion 5 3+  Hip extension    Hip abduction 5 3  Hip adduction 5 5  Hip internal rotation    Hip external rotation    Knee flexion 5 3-  Knee extension 5 3+  Ankle dorsiflexion 5 0  Ankle plantarflexion 5 0  Ankle inversion 5 0  Ankle eversion 5 0  (Blank rows = not tested)  BED MOBILITY:  indep  TRANSFERS: Assistive device utilized: Environmental consultant - 2 wheeled  Sit to stand: Complete Independence and Modified independence Stand to sit: Complete Independence and Modified independence Chair to chair: Modified independence Floor:  NT  RAMP:  Level of Assistance: SBA Assistive device utilized: Environmental consultant - 2 wheeled Ramp Comments:   CURB:  Level of Assistance: SBA and CGA Assistive device utilized: Environmental consultant - 2 wheeled Curb Comments:   STAIRS: Level of Assistance: Modified independence and SBA Stair Negotiation Technique: Step to Pattern with Bilateral Rails Number of Stairs: 12  Height of Stairs: 4-6"  Comments: pt negotiating 38 steps to her apartment daily  GAIT: Gait pattern: step to pattern Distance walked:  Assistive device utilized: Environmental consultant - 2 wheeled Level of assistance: Modified independence Comments: use of fixed left AFO  FUNCTIONAL TESTS:  5 times sit to stand: 25 sec Timed up and go (TUG): 30 sec w/ RW and left AFO Berg Balance Scale: 42/56 Gait speed: TBD  TODAY'S TREATMENT:                                                                                                                              DATE: 06/23/23    PATIENT EDUCATION: Education details: assessment details, HEP initiated Person educated: Patient Education method: Psychologist, counselling Education comprehension: needs further education  HOME EXERCISE PROGRAM: Access Code: [email or web address] URL: [email or web address] Date: 06/23/2023 Prepared by: Shary Decamp  Exercises - Seated Hamstring Curls with Resistance  - 1 x daily - 7 x weekly - 3 sets - 10 reps - Seated Knee Extension with Resistance  - 1 x daily - 7 x weekly - 3 sets - 10 reps - Side Stepping with Resistance at Ankles and Counter Support  - 1 x daily - 7 x weekly - 1-3 sets - 1-2 minute rounds  hold  GOALS: Goals reviewed with patient? Yes  SHORT TERM GOALS: Target date: 07/21/2023    Patient will be independent in HEP to improve functional outcomes Baseline: Goal status: MET  2.  Demo improved BLE strength and dynamic balance as evidenced by 15 sec 5xSTS test Baseline: 25 sec>13.5 sec Goal status: MET, 07/19/2023  3.  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  Demo ambulation speed 2.8 ft/sec to improve efficiency of community ambulation Baseline: TBD 2.09 ft/sec with cane (07/19/23)>2.69 ft/sec 08/18/2023 Goal status: PARTIALLY MET 08/18/2023  3.  Demo low risk for falls per score 50/56 Berg Balance Test Baseline: 42/56>52/56 08/18/2023 Goal status: MET 08/18/2023  4.  Demo low risk for falls per time 12 sec TUG test Baseline: 30 sec w/ RW>14.53 sec with cane; 17.13 sec with no device 07/19/2023>12.78 sec no cane 08/18/2023 Goal status: MET 08/18/2023  5.  Demo modified independent stair ambulation w/ reciprocal pattern to  improve safety and efficiency with entering/exiting home environment Baseline: step-to pattern bilat HR>alt step pattern with and without rail Goal status: MET 08/18/2023  (UPDATED) LONG TERM GOALS: Target date: 10/01/2023  Pt will be independent with progression of HEP for improved strength, balance, transfers, and gait. Baseline: last update 08/16/2023 Goal status: IN PROGRESS  2.  Pt will improve FGA score to at least 22/30 to demonstrate improved functional strength and transfer efficiency. Baseline: 19/30 Goal status: IN PROGRESS  3.  Pt will improve to at least 1000 ft  for improved gait and return to outdoor walking at park. Baseline: 850 ft Goal status: IN PROGRESS  4.  Pt will improve gait velocity to at least 3 ft/sec for improved gait efficiency and safety. Baseline: 2.69 ft/sec Goal status: IN PROGRESS    ASSESSMENT:  CLINICAL IMPRESSION: Patient arrived to session without new complaints. Continued working on jogging and plyometrics with cueing for improved push off, step length, and LE symmetry. Improved form and speed today. L toes catching occasionally when jogging at quicker pace, thus encouraged pt to be mindful of this if she practices jogging at home. Worked on yoga positions and transitions with some cueing and guarding for proper positioning and balance. Patient tolerated session well and is progressing towards goals.  OBJECTIVE IMPAIRMENTS: Abnormal gait, decreased activity tolerance, decreased balance, decreased coordination, decreased endurance, decreased knowledge of use of DME, decreased mobility, difficulty walking, decreased ROM, decreased strength, and impaired tone.   ACTIVITY LIMITATIONS: carrying, lifting, stairs, transfers, and locomotion level  PARTICIPATION LIMITATIONS: meal prep, cleaning, laundry, driving, shopping, community activity, and occupation  PERSONAL FACTORS: Time since onset of injury/illness/exacerbation and 1 comorbidity: PMH  are  also affecting patient's functional outcome.   REHAB POTENTIAL: Excellent  CLINICAL DECISION MAKING: Evolving/moderate complexity  EVALUATION COMPLEXITY: Moderate  PLAN:  PT FREQUENCY: 1x/week  PT DURATION: 6 weeks (per recert 08/18/2023)  PLANNED INTERVENTIONS: Therapeutic exercises, Therapeutic activity, Neuromuscular re-education, Balance training, Gait training, Patient/Family education, Self Care, Joint mobilization, Stair training, Vestibular training, Canalith repositioning, Orthotic/Fit training, DME instructions, Aquatic Therapy, Dry Needling, Electrical stimulation, Spinal  mobilization, Cryotherapy, Moist heat, Taping, Ionotophoresis 4mg /ml Dexamethasone, and Manual therapy  PLAN FOR NEXT SESSION: Gait and balance work without AFO; continue hip and ankle strengthening LLE; consider compliant surfaces to work ankle strength; consolidate HEP at some point.   Anette Guarneri, PT, DPT 09/08/23 4:13 PM  [facility] [address] [place], [address] [place], [place] [address] Phone # [phone] Fax # [phone]

## 2023-09-08 ENCOUNTER — Encounter: Payer: Self-pay | Admitting: Physical Therapy

## 2023-09-08 ENCOUNTER — Ambulatory Visit: Payer: Medicaid Other | Admitting: Physical Therapy

## 2023-09-08 DIAGNOSIS — R262 Difficulty in walking, not elsewhere classified: Secondary | ICD-10-CM

## 2023-09-08 DIAGNOSIS — R2681 Unsteadiness on feet: Secondary | ICD-10-CM

## 2023-09-08 DIAGNOSIS — M6281 Muscle weakness (generalized): Secondary | ICD-10-CM

## 2023-09-08 DIAGNOSIS — R2689 Other abnormalities of gait and mobility: Secondary | ICD-10-CM | POA: Diagnosis not present

## 2023-09-14 NOTE — Therapy (Signed)
OUTPATIENT PHYSICAL THERAPY NEURO TREATMENT   Patient Name: Alisha Thomas MRN: [id number] DOB:04/21/1971, 52 y.o., female Today's Date: 09/15/2023   PCP: Jordan Hawks, PA-C REFERRING PROVIDER: Carrolyn Leigh, FNP  END OF SESSION:  PT End of Session - 09/15/23 1609     Visit Number 17    Number of Visits 19    Date for PT Re-Evaluation 10/01/23    Authorization Type [employer] Medicaid-auth not required    Authorization - Visit Number 16    Authorization - Number of Visits 27   combined (but not getting OT)   PT Start Time 1529    PT Stop Time 1616    PT Time Calculation (min) 47 min    Equipment Utilized During Treatment Gait belt    Activity Tolerance Patient tolerated treatment well    Behavior During Therapy WFL for tasks assessed/performed                          Past Medical History:  Diagnosis Date   Hypertension    Ovarian cyst    Pelvic adhesions    Stomach ulcer    Past Surgical History:  Procedure Laterality Date   BREAST CYST ASPIRATION Left 2021   ECTOPIC PREGNANCY SURGERY     OVARIAN CYST SURGERY     PELVIC LAPAROSCOPY  11/30/2006   Diag Lap lysis of adhesions   Patient Active Problem List   Diagnosis Date Noted   Symptomatic anemia 03/26/2020   Iron deficiency anemia due to chronic blood loss 02/24/2018   Fibroids 07/04/2015   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   Peptic ulcer 09/10/2011   Ovarian cyst    Pelvic adhesions     ONSET DATE: 04/22/23  REFERRING DIAG: Z74.09 (ICD-10-CM) - Other reduced mobility Z78.9 (ICD-10-CM) - Other specified health status D32.0 (ICD-10-CM) - Benign neoplasm of cerebral meninges Z98.890 (ICD-10-CM) - Other specified postprocedural states  THERAPY DIAG:  Other abnormalities of gait and mobility  Unsteadiness on feet  Muscle weakness (generalized)  Difficulty in walking, not elsewhere classified  Rationale for Evaluation and Treatment: Rehabilitation  SUBJECTIVE:                                                                                                                                                                                              SUBJECTIVE STATEMENT: Did not practice running outside because nobody was with me. Reports that she wants to focus on ankle strength.   Pt accompanied by: self  PERTINENT HISTORY: hx of meningoma   PAIN:  Are you having pain? No  PRECAUTIONS: Fall  RED FLAGS: None   WEIGHT BEARING RESTRICTIONS: No  FALLS: Has patient fallen in last 6 months? No  LIVING ENVIRONMENT: Lives with: lives with their family Lives in: House/apartment Stairs: Yes: External: 38 steps; can reach both Has following equipment at home: Walker - 2 wheeled, shower seat  PLOF: Independent with basic ADLs and Independent with household mobility with device Works in schools as Runner, broadcasting/film/video PATIENT GOALS: improve walking  OBJECTIVE:     TODAY'S TREATMENT: 09/15/23 Activity Comments  wide forward fold> warrior II> inverse lunge > reverse warrior> extended triangle pose> wide forward fold Cueing for proper foot position, use of yoga blocks to modify; CGA for some poses d/t instability   squat jumps 5x5 reps Heavy cueing for arm swing, push off to gain height on jumps ; use of mirror feedback  single leg squats to elevated mat 10x Cues for max control    PATIENT EDUCATION: Education details: discussed pt's remaining concerns/deficits and reviewed HEP in detail for max understanding of muscle groups being addressed with each one; answered pt's questions about changes in tone with cold weather, HEP update Person educated: Patient Education method: Explanation Education comprehension: verbalized understanding    HOME EXERCISE PROGRAM Access Code: [email or web address] URL: [email or web address] Date: 09/15/2023 Prepared by: [facility] - Outpatient  Rehab - [facility]  Exercises - Side Stepping with Resistance at Ankles and Counter Support  - 1 x  daily - 7 x weekly - 1-3 sets - 1-2 minute rounds hold - Mini Squat  - 1 x daily - 5 x weekly - 2 sets - 10 reps - Standing Hip Abduction with Ankle Weight  - 1 x daily - 4 x weekly - 3 sets - 10 reps - Standing Hip Extension with Ankle Weight  - 1 x daily - 4 x weekly - 3 sets - 10 reps - Standing Knee Flexion with Ankle Weight  - 1 x daily - 4 x weekly - 3 sets - 10 reps - Single Leg Stance  - 1 x daily - 5 x weekly - 2 sets - 10 reps - Standing Gastroc Stretch at Counter  - 1 x daily - 7 x weekly - 1 sets - 3 reps - 30 sec hold - Long Sitting Ankle Inversion with Resistance  - 1 x daily - 5 x weekly - 2 sets - 10 reps - Long Sitting Ankle Eversion with Resistance  - 1 x daily - 5 x weekly - 2 sets - 10 reps - Seated Ankle Dorsiflexion with Resistance  - 1 x daily - 5 x weekly - 2 sets - 10 reps - Single Leg Squat with Chair Touch  - 1 x daily - 5 x weekly - 2 sets - 10 reps - Standing Single Leg Heel Raise  - 1 x daily - 5 x weekly - 2 sets - 5-10 reps     Below measures were taken at time of initial evaluation unless otherwise specified:   DIAGNOSTIC FINDINGS: surgical procedure  COGNITION: Overall cognitive status: Within functional limits for tasks assessed   SENSATION: WFL  COORDINATION: Impaired LLE due to weakness  EDEMA:  none  MUSCLE TONE: LLE: Flaccid dorsiflexors/toe extensors, plantarflexors. Left hamstrings   MUSCLE LENGTH: WNL, developing some tightness in left ankle dorsiflexion due to paralysis  DTRs:  NT  POSTURE: No Significant postural limitations  LOWER EXTREMITY ROM:     Active  Right Eval Left Eval  Hip flexion 120 100  Hip extension    Hip  abduction    Hip adduction    Hip internal rotation    Hip external rotation    Knee flexion 120 120  Knee extension 0 0  Ankle dorsiflexion 20 0  Ankle plantarflexion 35 0  Ankle inversion    Ankle eversion     (Blank rows = not tested)  LLE limited due to weakness Updated MMT taken  08/05/2023 MMT:  L ankle dorsiflexion 3-/5  L ankle eversion 3-/5  L ankle plantar flexion3-/5  L hamstrings 3+/5  L quads 4/5  LOWER EXTREMITY MMT:    MMT Right Eval Left Eval  Hip flexion 5 3+  Hip extension    Hip abduction 5 3  Hip adduction 5 5  Hip internal rotation    Hip external rotation    Knee flexion 5 3-  Knee extension 5 3+  Ankle dorsiflexion 5 0  Ankle plantarflexion 5 0  Ankle inversion 5 0  Ankle eversion 5 0  (Blank rows = not tested)  BED MOBILITY:  indep  TRANSFERS: Assistive device utilized: Environmental consultant - 2 wheeled  Sit to stand: Complete Independence and Modified independence Stand to sit: Complete Independence and Modified independence Chair to chair: Modified independence Floor:  NT  RAMP:  Level of Assistance: SBA Assistive device utilized: Environmental consultant - 2 wheeled Ramp Comments:   CURB:  Level of Assistance: SBA and CGA Assistive device utilized: Environmental consultant - 2 wheeled Curb Comments:   STAIRS: Level of Assistance: Modified independence and SBA Stair Negotiation Technique: Step to Pattern with Bilateral Rails Number of Stairs: 12  Height of Stairs: 4-6"  Comments: pt negotiating 38 steps to her apartment daily  GAIT: Gait pattern: step to pattern Distance walked:  Assistive device utilized: Environmental consultant - 2 wheeled Level of assistance: Modified independence Comments: use of fixed left AFO  FUNCTIONAL TESTS:  5 times sit to stand: 25 sec Timed up and go (TUG): 30 sec w/ RW and left AFO Berg Balance Scale: 42/56 Gait speed: TBD  TODAY'S TREATMENT:                                                                                                                              DATE: 06/23/23    PATIENT EDUCATION: Education details: assessment details, HEP initiated Person educated: Patient Education method: Chief Technology Officer Education comprehension: needs further education  HOME EXERCISE PROGRAM: Access Code: [email or web address] URL:  [email or web address] Date: 06/23/2023 Prepared by: Shary Decamp  Exercises - Seated Hamstring Curls with Resistance  - 1 x daily - 7 x weekly - 3 sets - 10 reps - Seated Knee Extension with Resistance  - 1 x daily - 7 x weekly - 3 sets - 10 reps - Side Stepping with Resistance at Ankles and Counter Support  - 1 x daily - 7 x weekly - 1-3 sets - 1-2 minute rounds hold  GOALS: Goals reviewed with patient? Yes  SHORT TERM GOALS: Target date: 07/21/2023  Patient will be independent in HEP to improve functional outcomes Baseline: Goal status: MET  2.  Demo improved BLE strength and dynamic balance as evidenced by 15 sec 5xSTS test Baseline: 25 sec>13.5 sec Goal status: MET, 07/19/2023  3.  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  Demo ambulation speed 2.8 ft/sec to improve efficiency of community ambulation Baseline: TBD 2.09 ft/sec with cane (07/19/23)>2.69 ft/sec 08/18/2023 Goal status: PARTIALLY MET 08/18/2023  3.  Demo low risk for falls per score 50/56 Berg Balance Test Baseline: 42/56>52/56 08/18/2023 Goal status: MET 08/18/2023  4.  Demo low risk for falls per time 12 sec TUG test Baseline: 30 sec w/ RW>14.53 sec with cane; 17.13 sec with no device 07/19/2023>12.78 sec no cane 08/18/2023 Goal status: MET 08/18/2023  5.  Demo modified independent stair ambulation w/ reciprocal pattern to improve safety and efficiency with entering/exiting home environment Baseline: step-to pattern bilat HR>alt step  pattern with and without rail Goal status: MET 08/18/2023  (UPDATED) LONG TERM GOALS: Target date: 10/01/2023  Pt will be independent with progression of HEP for improved strength, balance, transfers, and gait. Baseline: last update 08/16/2023 Goal status: IN PROGRESS  2.  Pt will improve FGA score to at least 22/30 to demonstrate improved functional strength and transfer efficiency. Baseline: 19/30 Goal status: IN PROGRESS  3.  Pt will improve to at least 1000 ft  for improved gait and return to outdoor walking at park. Baseline: 850 ft Goal status: IN PROGRESS  4.  Pt will improve gait velocity to at least 3 ft/sec for improved gait efficiency and safety. Baseline: 2.69 ft/sec Goal status: IN PROGRESS    ASSESSMENT:  CLINICAL IMPRESSION: Patient arrived to session without complaints. Provided education and answered pt's questions on remaining complaints. Clarified HEP - patient reported understanding after review. Continued with more complex yoga sequences which patient tolerated well. Cueing provided for proper positioning and occasional CGA for instability. Patient demonstrated difficulty completing squat jumps d/t ankle weakness; form and jump height improved after several reps. Patient reported understanding of HEP update and without complaints upon leaving.  OBJECTIVE IMPAIRMENTS: Abnormal gait, decreased activity tolerance, decreased balance, decreased coordination, decreased endurance, decreased knowledge of use of DME, decreased mobility, difficulty walking, decreased ROM, decreased strength, and impaired tone.   ACTIVITY LIMITATIONS: carrying, lifting, stairs, transfers, and locomotion level  PARTICIPATION LIMITATIONS: meal prep, cleaning, laundry, driving, shopping, community activity, and occupation  PERSONAL FACTORS: Time since onset of injury/illness/exacerbation and 1 comorbidity: PMH  are also affecting patient's functional outcome.   REHAB POTENTIAL:  Excellent  CLINICAL DECISION MAKING: Evolving/moderate complexity  EVALUATION COMPLEXITY: Moderate  PLAN:  PT FREQUENCY: 1x/week  PT DURATION: 6 weeks (per recert 08/18/2023)  PLANNED INTERVENTIONS: Therapeutic exercises, Therapeutic activity, Neuromuscular re-education, Balance training, Gait training, Patient/Family education, Self Care, Joint mobilization, Stair training, Vestibular training, Canalith repositioning, Orthotic/Fit training, DME instructions, Aquatic Therapy, Dry Needling, Electrical stimulation, Spinal mobilization, Cryotherapy, Moist heat, Taping, Ionotophoresis 4mg /ml Dexamethasone, and Manual therapy  PLAN FOR NEXT SESSION: possible DC vs. Recert next session; Gait and  balance work without AFO; continue hip and ankle strengthening LLE; consider compliant surfaces to work ankle strength; consolidate HEP at some point.   Anette Guarneri, PT, DPT 09/15/23 4:18 PM  [facility] at [facility] [address] [place], [address] [place], [place] [address] Phone # [phone] Fax # [phone]

## 2023-09-15 ENCOUNTER — Ambulatory Visit: Payer: Medicaid Other | Admitting: Physical Therapy

## 2023-09-15 DIAGNOSIS — R262 Difficulty in walking, not elsewhere classified: Secondary | ICD-10-CM

## 2023-09-15 DIAGNOSIS — M6281 Muscle weakness (generalized): Secondary | ICD-10-CM

## 2023-09-15 DIAGNOSIS — R2689 Other abnormalities of gait and mobility: Secondary | ICD-10-CM | POA: Diagnosis not present

## 2023-09-15 DIAGNOSIS — R2681 Unsteadiness on feet: Secondary | ICD-10-CM

## 2023-09-16 ENCOUNTER — Encounter (HOSPITAL_BASED_OUTPATIENT_CLINIC_OR_DEPARTMENT_OTHER): Payer: Self-pay

## 2023-09-16 ENCOUNTER — Other Ambulatory Visit: Payer: Self-pay

## 2023-09-16 ENCOUNTER — Emergency Department (HOSPITAL_BASED_OUTPATIENT_CLINIC_OR_DEPARTMENT_OTHER): Payer: Medicaid Other

## 2023-09-16 ENCOUNTER — Emergency Department (HOSPITAL_BASED_OUTPATIENT_CLINIC_OR_DEPARTMENT_OTHER)
Admission: EM | Admit: 2023-09-16 | Discharge: 2023-09-17 | Disposition: A | Discharge status: Home / Self Care | Payer: Medicaid Other | Attending: Emergency Medicine | Admitting: Emergency Medicine

## 2023-09-16 DIAGNOSIS — Z79899 Other long term (current) drug therapy: Secondary | ICD-10-CM | POA: Diagnosis not present

## 2023-09-16 DIAGNOSIS — Z20822 Contact with and (suspected) exposure to covid-19: Secondary | ICD-10-CM | POA: Diagnosis not present

## 2023-09-16 DIAGNOSIS — R002 Palpitations: Secondary | ICD-10-CM | POA: Insufficient documentation

## 2023-09-16 DIAGNOSIS — I10 Essential (primary) hypertension: Secondary | ICD-10-CM | POA: Diagnosis not present

## 2023-09-16 DIAGNOSIS — R509 Fever, unspecified: Secondary | ICD-10-CM | POA: Diagnosis not present

## 2023-09-16 DIAGNOSIS — R0982 Postnasal drip: Secondary | ICD-10-CM | POA: Insufficient documentation

## 2023-09-16 DIAGNOSIS — R6883 Chills (without fever): Secondary | ICD-10-CM

## 2023-09-16 LAB — BASIC METABOLIC PANEL
Anion gap: 9 (ref 5–15)
BUN: 11 mg/dL (ref 6–20)
CO2: 28 mmol/L (ref 22–32)
Calcium: 9.7 mg/dL (ref 8.9–10.3)
Chloride: 106 mmol/L (ref 98–111)
Creatinine, Ser: 0.7 mg/dL (ref 0.44–1.00)
GFR, Estimated: >60 mL/min (ref >60)
Glucose, Bld: 132 mg/dL — ABNORMAL HIGH (ref 70–99)
Potassium: 3.6 mmol/L (ref 3.5–5.1)
Sodium: 143 mmol/L (ref 135–145)

## 2023-09-16 LAB — CBC
HCT: 37.8 % (ref 36.0–46.0)
Hemoglobin: 12.6 g/dL (ref 12.0–15.0)
MCH: 27.6 pg (ref 26.0–34.0)
MCHC: 33.3 g/dL (ref 30.0–36.0)
MCV: 82.7 fL (ref 80.0–100.0)
Platelets: 377 10*3/uL (ref 150–400)
RBC: 4.57 MIL/uL (ref 3.87–5.11)
RDW: 13 % (ref 11.5–15.5)
WBC: 6.8 10*3/uL (ref 4.0–10.5)
nRBC: 0 % (ref 0.0–0.2)

## 2023-09-16 NOTE — ED Triage Notes (Signed)
Pt c/o heart racing and chills that started around 18:00. Pt reports she was hospitalized 4 months ago and had a brain tumor removed.

## 2023-09-17 LAB — URINALYSIS, W/ REFLEX TO CULTURE (INFECTION SUSPECTED)
Bacteria, UA: NONE SEEN
Bilirubin Urine: NEGATIVE
Glucose, UA: NEGATIVE mg/dL
Hgb urine dipstick: NEGATIVE
Ketones, ur: NEGATIVE mg/dL
Leukocytes,Ua: NEGATIVE
Nitrite: NEGATIVE
Protein, ur: NEGATIVE mg/dL
Specific Gravity, Urine: 1.01 (ref 1.005–1.030)
pH: 7 (ref 5.0–8.0)

## 2023-09-17 LAB — PREGNANCY, URINE: Preg Test, Ur: NEGATIVE

## 2023-09-17 LAB — RESP PANEL BY RT-PCR (RSV, FLU A&B, COVID)  RVPGX2
Influenza A by PCR: NEGATIVE
Influenza B by PCR: NEGATIVE
Resp Syncytial Virus by PCR: NEGATIVE
SARS Coronavirus 2 by RT PCR: NEGATIVE

## 2023-09-17 NOTE — Discharge Instructions (Addendum)
You may take over-the-counter medicine for symptomatic relief, such as Tylenol, Motrin, TheraFlu, Alka seltzer , black elderberry, etc. Please limit acetaminophen (Tylenol) to 4000 mg and Ibuprofen (Motrin, Advil, etc.) to 2400 mg for a 24hr period. Please note that other over-the-counter medicine may contain acetaminophen or ibuprofen as a component of their ingredients.   

## 2023-09-17 NOTE — ED Provider Notes (Signed)
[place] EMERGENCY DEPARTMENT AT [facility] Provider Note  CSN: [id number] Arrival date & time: 09/16/23 1950  Chief Complaint(s) Palpitations and Chills  HPI Alisha Thomas is a 52 y.o. female with a past medical history listed below who presents to the emergency department with chills and rigor with rapid palpitations that began around 6 PM.  Patient reports subjective fevers.  Denies any known sick contacts.  No cough or congestion.  No chest pain or shortness of breath.  No abdominal pain, nausea, vomiting, diarrhea.  No urinary symptoms.  Reports taking over-the-counter Tylenol before arrival.  The history is provided by the patient.    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 Authorizing Provider  amLODipine (NORVASC) 5 MG tablet Take 1 tablet (5 mg total) by mouth daily. 03/14/23   Renne Crigler, PA-C  IRON PO Take 1 tablet by mouth daily.     [provider]  Multiple Vitamin (MULTI-VITAMIN PO) Take by mouth.    [provider]  predniSONE (STERAPRED UNI-PAK 21 TAB) 10 MG (21) TBPK tablet Take by mouth daily. Take 6 tabs by mouth daily  for 2 days, then 5 tabs for 2 days, then 4 tabs for 2 days, then 3 tabs for 2 days, 2 tabs for 2 days, then 1 tab by mouth daily for 2 days 04/22/23   Rondel Baton, MD                                                                                                                                    Allergies Penicillins  Review of Systems Review of  Systems As noted in HPI  Physical Exam Vital Signs  I have reviewed the triage vital signs BP 119/72   Pulse 79   Temp 98 F (36.7 C) (Oral)   Resp 17   Ht 5\' 4"  (1.626 m)   Wt 63.5 kg   SpO2 100%   BMI 24.03 kg/m   Physical Exam Vitals reviewed.  Constitutional:      General: She is not in acute distress.    Appearance: She is well-developed. She is not diaphoretic.  HENT:     Head: Normocephalic and atraumatic.     Right Ear: Tympanic membrane normal.     Left Ear: Tympanic membrane normal.     Nose: Nose normal.     Mouth/Throat:     Pharynx: Postnasal drip present.  No posterior oropharyngeal erythema.     Tonsils: No tonsillar exudate.  Eyes:     General: No scleral icterus.       Right eye: No discharge.        Left eye: No discharge.     Conjunctiva/sclera: Conjunctivae normal.     Pupils: Pupils are equal, round, and reactive to light.  Cardiovascular:     Rate and Rhythm: Normal rate and regular rhythm.     Heart sounds: No murmur heard.    No friction rub. No gallop.  Pulmonary:     Effort: Pulmonary effort is normal. No respiratory distress.     Breath sounds: Normal breath sounds. No stridor. No rales.  Abdominal:     General: There is no distension.     Palpations: Abdomen is soft.     Tenderness: There is no abdominal tenderness.  Musculoskeletal:        General: No tenderness.     Cervical back: Normal range of motion and neck supple.  Skin:    General: Skin is warm and dry.     Findings: No erythema or rash.  Neurological:     Mental Status: She is alert and oriented to person, place, and time.     ED Results and Treatments Labs (all labs ordered are listed, but only abnormal results are displayed) Labs Reviewed  BASIC METABOLIC PANEL - Abnormal; Notable for the following components:      Result Value   Glucose, Bld 132 (*)    All other components within normal limits  URINALYSIS, W/ REFLEX TO CULTURE (INFECTION SUSPECTED) - Abnormal;  Notable for the following components:   Color, Urine COLORLESS (*)    All other components within normal limits  RESP PANEL BY RT-PCR (RSV, FLU A&B, COVID)  [id number]  CBC  PREGNANCY, URINE                                                                                                                         EKG  EKG Interpretation Date/Time:  Thursday September 16 2023 20:05:08 EDT Ventricular Rate:  109 PR Interval:  154 QRS Duration:  72 QT Interval:  310 QTC Calculation: 417 R Axis:   86  Text Interpretation: Sinus tachycardia Right atrial enlargement Nonspecific ST and T wave abnormality Abnormal ECG When compared with ECG of 22-Apr-2023 16:20, Artifact Otherwise no significant change Confirmed by Drema Pry [phone]) on 09/16/2023 10:56:28 PM       Radiology DG Chest 1 View  Result Date: 09/16/2023 CLINICAL DATA:  Elevated heart rate, chills, shakiness EXAM: PORTABLE CHEST 1 VIEW COMPARISON:  Radiograph 04/10/2023 FINDINGS: The heart size and mediastinal contours are within normal limits. Both lungs are clear. The visualized skeletal structures are unremarkable. IMPRESSION: No active disease. Electronically Signed   By: Minerva Fester M.D.   On: 09/16/2023 20:36    Medications Ordered in ED Medications - No data to display Procedures Procedures  (including critical care time) Medical  Decision Making / ED Course   Medical Decision Making Amount and/or Complexity of Data Reviewed Labs: ordered. Decision-making details documented in ED Course. Radiology: ordered and independent interpretation performed. Decision-making details documented in ED Course. ECG/medicine tests: ordered and independent interpretation performed. Decision-making details documented in ED Course.    Workup for differential diagnosis listed below.  CBC without leukocytosis or anemia. BMP without significant electrolyte derangements or renal sufficiency. UA without evidence of infection. Viral  panel negative for COVID, influenza, RSV. Chest x-ray without evidence of pneumonia, pneumothorax, pulmonary edema pleural effusions. EKG was sinus tachycardia.  Patient's tachycardia resolved and remained with rates in the 70s to 80s. She is well-appearing, well-hydrated. Given the postnasal drip and cobblestoning, favoring viral infection.    Final Clinical Impression(s) / ED Diagnoses Final diagnoses:  Chills  Palpitation  Post-nasal drainage   The patient appears reasonably screened and/or stabilized for discharge and I doubt any other medical condition or other [facility] requiring further screening, evaluation, or treatment in the ED at this time. I have discussed the findings, Dx and Tx plan with the patient/family who expressed understanding and agree(s) with the plan. Discharge instructions discussed at length. The patient/family was given strict return precautions who verbalized understanding of the instructions. No further questions at time of discharge.  Disposition: Discharge  Condition: Good  ED Discharge Orders     None        Follow Up: Barbarann Ehlers [address] [place] [address] [phone]  Call  to schedule an appointment for close follow up    This chart was dictated using voice recognition software.  Despite best efforts to proofread,  errors can occur which can change the documentation meaning.    Nira Conn, MD 09/17/23 [phone]

## 2023-09-17 NOTE — ED Notes (Signed)
Discharge instructions discussed with pt. Pt verbalized understanding. Pt stable and ambulatory.  °

## 2023-09-20 NOTE — Plan of Care (Signed)
[facility] Tonsillectomy/Adenoidectomy, Postoperative PEDS care plan entered in error.

## 2023-09-22 ENCOUNTER — Encounter: Payer: Self-pay | Admitting: Physical Therapy

## 2023-09-22 ENCOUNTER — Ambulatory Visit: Payer: Medicaid Other | Admitting: Physical Therapy

## 2023-09-22 DIAGNOSIS — R2689 Other abnormalities of gait and mobility: Secondary | ICD-10-CM | POA: Diagnosis not present

## 2023-09-22 DIAGNOSIS — M6281 Muscle weakness (generalized): Secondary | ICD-10-CM

## 2023-09-22 DIAGNOSIS — R2681 Unsteadiness on feet: Secondary | ICD-10-CM

## 2023-09-22 NOTE — Therapy (Signed)
OUTPATIENT PHYSICAL THERAPY NEURO TREATMENT   Patient Name: Alisha Thomas MRN: [id number] DOB:[birth year], 52 y.o., female Today's Date: 09/23/2023   PCP: Jordan Hawks, PA-C REFERRING PROVIDER: Carrolyn Leigh, FNP  END OF SESSION:  PT End of Session - 09/22/23 1534     Visit Number 18    Number of Visits 22    Date for PT Re-Evaluation 11/19/23   per recert   Authorization Type [employer]-auth not required    Authorization - Visit Number 18   incorrect counting several visits ago   Authorization - Number of Visits 27   combined (but not getting OT)   PT Start Time 1534    PT Stop Time 1617    PT Time Calculation (min) 43 min    Equipment Utilized During Treatment Gait belt    Activity Tolerance Patient tolerated treatment well    Behavior During Therapy WFL for tasks assessed/performed                           Past Medical History:  Diagnosis Date   Hypertension    Ovarian cyst    Pelvic adhesions    Stomach ulcer    Past Surgical History:  Procedure Laterality Date   BRAIN TUMOR EXCISION     BREAST CYST ASPIRATION Left 2021   ECTOPIC PREGNANCY SURGERY     OVARIAN CYST SURGERY     PELVIC LAPAROSCOPY  11/30/2006   Diag Lap lysis of adhesions   Patient Active Problem List   Diagnosis Date Noted   Symptomatic anemia 03/26/2020   Iron deficiency anemia due to chronic blood loss 02/24/2018   Fibroids 07/04/2015   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   Peptic ulcer 09/10/2011   Ovarian cyst    Pelvic adhesions     ONSET DATE: 04/22/23  REFERRING DIAG: Z74.09 (ICD-10-CM) - Other reduced mobility Z78.9 (ICD-10-CM) - Other specified health status 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  It is the last day, and I'm not sure about that.   Pt accompanied by:  self  PERTINENT HISTORY: hx of meningoma   PAIN:  Are you having pain? No  PRECAUTIONS: Fall  RED FLAGS: None   WEIGHT BEARING RESTRICTIONS: No  FALLS: Has patient fallen in last 6 months? No  LIVING ENVIRONMENT: Lives with: lives with their family Lives in: House/apartment Stairs: Yes: External: 38 steps; can reach both Has following equipment at home: Walker - 2 wheeled, shower seat  PLOF: Independent with basic ADLs and Independent with household mobility with device Works in schools as Runner, broadcasting/film/video PATIENT GOALS: improve walking  OBJECTIVE:    TODAY'S TREATMENT: 09/22/2023 Activity Comments  Reviewed single leg heel raises and single leg squats Good control, still challenging  FGA 24/30 Improved from 19/30  :  1110 ft Improved from 850 ft  Gt velocity 2.7 ft/sec   Backwards walking at counter   Forward/back walking at [employer] on toe>heel and heel>toe pattern    [facility] PT Assessment - 09/23/23 0001       Functional Gait  Assessment   Gait assessed  Yes    Gait Level Surface Walks 20 ft in less than 7 sec but greater than 5.5 sec, uses assistive device, slower speed, mild gait deviations, or deviates 6-10 in outside of the 12 in walkway width.   6.85   Change in Gait Speed Able to smoothly change walking speed without loss of balance or gait deviation. Deviate no more than 6 in outside of the 12 in walkway width.    Gait with Horizontal Head Turns Performs head turns smoothly with slight change in gait velocity (eg, minor disruption to smooth gait path), deviates 6-10 in outside 12 in walkway  width, or uses an assistive device.   8.53   Gait with Vertical Head Turns Performs head turns with no change in gait. Deviates no more than 6 in outside 12 in walkway width.   6.97   Gait and Pivot Turn Pivot turns safely within 3 sec and stops quickly with no loss of balance.    Step Over Obstacle Is able to step over one shoe box (4.5 in total height) without changing gait speed. No evidence of imbalance.    Gait with Narrow Base of Support Is able to ambulate for 10 steps heel to toe with no staggering.    Gait with Eyes Closed Walks 20 ft, slow speed, abnormal gait pattern, evidence for imbalance, deviates 10-15 in outside 12 in walkway width. Requires more than 9 sec to ambulate 20 ft.   10.92   Ambulating Backwards Walks 20 ft, uses assistive device, slower speed, mild gait deviations, deviates 6-10 in outside 12 in walkway width.   20.85 sec   Steps Alternating feet, no rail.    Total Score 24               PATIENT EDUCATION: Education details: Progress towards goals, POC; plan to reduce frequency to 2x/month for 2 months to continue to guide pt's progress with HEP and community fitness Person educated: Patient Education method: Explanation Education comprehension: verbalized understanding    HOME EXERCISE PROGRAM  Access Code: [email or web address] URL: [email or web address] Date: 09/23/2023 Prepared by: [facility] Outpatient  Re[facility] - [facility]  Exercises - Side Stepping with Resistance at Ankles and Counter Support  - 1 x daily - 7 x weekly - 1-3 sets - 1-2 minute rounds hold - Mini Squat  - 1 x daily - 5 x weekly - 2 sets - 10 reps - Standing Hip Abduction with Ankle Weight  -  1 x daily - 4 x weekly - 3 sets - 10 reps - Standing Hip Extension with Ankle Weight  - 1 x daily - 4 x weekly - 3 sets - 10 reps - Standing Knee Flexion with Ankle Weight  - 1 x daily - 4 x weekly - 3 sets - 10 reps - Single Leg Stance  - 1 x daily - 5 x weekly - 2 sets - 10 reps -  Standing Gastroc Stretch at Counter  - 1 x daily - 7 x weekly - 1 sets - 3 reps - 30 sec hold - Long Sitting Ankle Inversion with Resistance  - 1 x daily - 5 x weekly - 2 sets - 10 reps - Long Sitting Ankle Eversion with Resistance  - 1 x daily - 5 x weekly - 2 sets - 10 reps - Seated Ankle Dorsiflexion with Resistance  - 1 x daily - 5 x weekly - 2 sets - 10 reps - Single Leg Squat with Chair Touch  - 1 x daily - 5 x weekly - 2 sets - 10 reps - Standing Single Leg Heel Raise  - 1 x daily - 5 x weekly - 2 sets - 5-10 reps - Backward Walking with Counter Support  - 1 x daily - 7 x weekly - 1-2 sets - 2 minutes hold    Below measures were taken at time of initial evaluation unless otherwise specified:   DIAGNOSTIC FINDINGS: surgical procedure  COGNITION: Overall cognitive status: Within functional limits for tasks assessed   SENSATION: WFL  COORDINATION: Impaired LLE due to weakness  EDEMA:  none  MUSCLE TONE: LLE: Flaccid dorsiflexors/toe extensors, plantarflexors. Left hamstrings   MUSCLE LENGTH: WNL, developing some tightness in left ankle dorsiflexion due to paralysis  DTRs:  NT  POSTURE: No Significant postural limitations  LOWER EXTREMITY ROM:     Active  Right Eval Left Eval  Hip flexion 120 100  Hip extension    Hip abduction    Hip adduction    Hip internal rotation    Hip external rotation    Knee flexion 120 120  Knee extension 0 0  Ankle dorsiflexion 20 0  Ankle plantarflexion 35 0  Ankle inversion    Ankle eversion     (Blank rows = not tested)  LLE limited due to weakness Updated MMT taken 08/05/2023 MMT:  L ankle dorsiflexion 3-/5  L ankle eversion 3-/5  L ankle plantar flexion3-/5  L hamstrings 3+/5  L quads 4/5  LOWER EXTREMITY MMT:    MMT Right Eval Left Eval  Hip flexion 5 3+  Hip extension    Hip abduction 5 3  Hip adduction 5 5  Hip internal rotation    Hip external rotation    Knee flexion 5 3-  Knee extension 5 3+  Ankle  dorsiflexion 5 0  Ankle plantarflexion 5 0  Ankle inversion 5 0  Ankle eversion 5 0  (Blank rows = not tested)  BED MOBILITY:  indep  TRANSFERS: Assistive device utilized: Environmental consultant - 2 wheeled  Sit to stand: Complete Independence and Modified independence Stand to sit: Complete Independence and Modified independence Chair to chair: Modified independence Floor:  NT  RAMP:  Level of Assistance: SBA Assistive device utilized: Environmental consultant - 2 wheeled Ramp Comments:   CURB:  Level of Assistance: SBA and CGA Assistive device utilized: Environmental consultant - 2 wheeled Curb Comments:   STAIRS: Level of Assistance: Modified independence and SBA Stair Negotiation Technique: Step  to Pattern with Bilateral Rails Number of Stairs: 12  Height of Stairs: 4-6"  Comments: pt negotiating 38 steps to her apartment daily  GAIT: Gait pattern: step to pattern Distance walked:  Assistive device utilized: Environmental consultant - 2 wheeled Level of assistance: Modified independence Comments: use of fixed left AFO  FUNCTIONAL TESTS:  5 times sit to stand: 25 sec Timed up and go (TUG): 30 sec w/ RW and left AFO Berg Balance Scale: 42/56 Gait speed: TBD  TODAY'S TREATMENT:                                                                                                                              DATE: 06/23/23    PATIENT EDUCATION: Education details: assessment details, HEP initiated Person educated: Patient Education method: Chief Technology Officer Education comprehension: needs further education  HOME EXERCISE PROGRAM: Access Code: [email or web address] URL: [email or web address] Date: 06/23/2023 Prepared by: Shary Decamp  Exercises - Seated Hamstring Curls with Resistance  - 1 x daily - 7 x weekly - 3 sets - 10 reps - Seated Knee Extension with Resistance  - 1 x daily - 7 x weekly - 3 sets - 10 reps - Side Stepping with Resistance at Ankles and Counter Support  - 1 x daily - 7 x weekly - 1-3 sets - 1-2 minute  rounds hold  GOALS: Goals reviewed with patient? Yes  LONG TERM GOALS: UPDATED TARGET DATE:  11/19/2023    Pt will be independent with progression of HEP and transition to community fitness for improved strength, balance, and gait. Baseline: last updated mid October 2024 Goal status: INITIAL  Pt will improve FGA score to at least 27/30 to demonstrate improved functional strength and transfer efficiency. Baseline: 19/30>24/30 09/22/2023 Goal status: REVISED   Pt will improve to at least 1250 ft  for improved gait and return to outdoor walking at park. Baseline: 850 ft>1110 ft 09/22/2023 Goal status: REVISED  Pt will improve gait velocity to at least 3 ft/sec for improved gait efficiency and safety. Baseline: 2.69 ft/sec>2.74 ft/sec 09/22/2023 Goal status: IN PROGRESS   LONG TERM GOALS: Target date: 10/01/2023  Pt will be independent with progression of HEP for improved strength, balance, transfers, and gait. Baseline: last update 08/16/2023 Goal status: MET  2.  Pt will improve FGA score to at least 22/30 to demonstrate improved functional strength and transfer efficiency. Baseline: 19/30>24/30 09/22/2023 Goal status: MET 09/22/2023  3.  Pt will improve to at least 1000 ft  for improved gait and return to outdoor walking at park. Baseline: 850 ft>1110 ft  Goal status: MET  4.  Pt will improve gait velocity to at least 3 ft/sec for improved gait efficiency and safety. Baseline: 2.69 ft/sec>2.74 ft/sec 09/22/2023 Goal status: IN PROGRESS    ASSESSMENT:  CLINICAL IMPRESSION: Assessed LTGs this visit, with pt meeting 3 of 4 LTGs.  She has improved walking distance in by almost 300 ft (still below age  related baseline of at least 1700 ft), and she has improved FGA to lower her fall risk, with score of 24/30.  Pt continues to demonstrate decreased functional strength with ankle L ankle dorsiflexion, which shows through with foot slap in gait.  She has not had any  falls, but she reports being hesitant to progress community gait due to ankle weakness.  She will benefit from continuing therapy at reduced frequency to continue to progress HEP and transition to community fitness, for overall improved functional mobility, gait independence and return to PLOF.  OBJECTIVE IMPAIRMENTS: Abnormal gait, decreased activity tolerance, decreased balance, decreased coordination, decreased endurance, decreased knowledge of use of DME, decreased mobility, difficulty walking, decreased ROM, decreased strength, and impaired tone.   ACTIVITY LIMITATIONS: carrying, lifting, stairs, transfers, and locomotion level  PARTICIPATION LIMITATIONS: meal prep, cleaning, laundry, driving, shopping, community activity, and occupation  PERSONAL FACTORS: Time since onset of injury/illness/exacerbation and 1 comorbidity: PMH  are also affecting patient's functional outcome.   REHAB POTENTIAL: Excellent  CLINICAL DECISION MAKING: Evolving/moderate complexity  EVALUATION COMPLEXITY: Moderate  PLAN:  PT FREQUENCY: 2x/month  PT DURATION: other: 2 months  (per recert 09/23/2023)  PLANNED INTERVENTIONS: Therapeutic exercises, Therapeutic activity, Neuromuscular re-education, Balance training, Gait training, Patient/Family education, Self Care, Joint mobilization, Stair training, Vestibular training, Canalith repositioning, Orthotic/Fit training, DME instructions, Aquatic Therapy, Dry Needling, Electrical stimulation, Spinal mobilization, Cryotherapy, Moist heat, Taping, Ionotophoresis 4mg /ml Dexamethasone, and Manual therapy  PLAN FOR NEXT SESSION: Recert completed this session; Gait and balance work; continue hip and ankle strengthening LLE; consider compliant surfaces to work ankle strength; consolidate HEP at some point.   Lonia Blood, PT 09/23/23 3:22 PM Phone: [phone] Fax: [phone]  [facility] at [facility] [address] [place], [address] [place], [place] [address] Phone # [phone] Fax # [phone]

## 2023-10-05 ENCOUNTER — Encounter: Payer: Self-pay | Admitting: Physical Therapy

## 2023-10-05 ENCOUNTER — Ambulatory Visit: Payer: Medicaid Other | Attending: Nurse Practitioner | Admitting: Physical Therapy

## 2023-10-05 DIAGNOSIS — M6281 Muscle weakness (generalized): Secondary | ICD-10-CM | POA: Insufficient documentation

## 2023-10-05 DIAGNOSIS — R2681 Unsteadiness on feet: Secondary | ICD-10-CM | POA: Diagnosis present

## 2023-10-05 DIAGNOSIS — R2689 Other abnormalities of gait and mobility: Secondary | ICD-10-CM | POA: Diagnosis present

## 2023-10-05 NOTE — Therapy (Signed)
OUTPATIENT PHYSICAL THERAPY NEURO TREATMENT   Patient Name: Gena Laski MRN: [id number] DOB:07/03/1971, 52 y.o., female Today's Date: 10/05/2023   PCP: Jordan Hawks, PA-C REFERRING PROVIDER: Carrolyn Leigh, FNP  END OF SESSION:  PT End of Session - 10/05/23 1229     Visit Number 19    Number of Visits 22    Date for PT Re-Evaluation 11/19/23   per recert   Authorization Type [employer] Medicaid-auth not required    Authorization - Visit Number 19    Authorization - Number of Visits 27   combined (but not getting OT)   PT Start Time 1231    PT Stop Time 1314    PT Time Calculation (min) 43 min    Equipment Utilized During Treatment --    Activity Tolerance Patient tolerated treatment well    Behavior During Therapy WFL for tasks assessed/performed                            Past Medical History:  Diagnosis Date   Hypertension    Ovarian cyst    Pelvic adhesions    Stomach ulcer    Past Surgical History:  Procedure Laterality Date   BRAIN TUMOR EXCISION     BREAST CYST ASPIRATION Left 2021   ECTOPIC PREGNANCY SURGERY     OVARIAN CYST SURGERY     PELVIC LAPAROSCOPY  11/30/2006   Diag Lap lysis of adhesions   Patient Active Problem List   Diagnosis Date Noted   Symptomatic anemia 03/26/2020   Iron deficiency anemia due to chronic blood loss 02/24/2018   Fibroids 07/04/2015   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   Peptic ulcer 09/10/2011   Ovarian cyst    Pelvic adhesions     ONSET DATE: 04/22/23  REFERRING DIAG: Z74.09 (ICD-10-CM) - Other reduced mobility Z78.9 (ICD-10-CM) - Other specified health status 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  No falls.   Want to review the ankle exercises with the band.  Pt accompanied by: self  PERTINENT HISTORY: hx of meningoma   PAIN:  Are you having  pain? No  PRECAUTIONS: Fall  RED FLAGS: None   WEIGHT BEARING RESTRICTIONS: No  FALLS: Has patient fallen in last 6 months? No  LIVING ENVIRONMENT: Lives with: lives with their family Lives in: House/apartment Stairs: Yes: External: 38 steps; can reach both Has following equipment at home: Walker - 2 wheeled, shower seat  PLOF: Independent with basic ADLs and Independent with household mobility with device Works in schools as Runner, broadcasting/film/video PATIENT GOALS: improve walking  OBJECTIVE:    TODAY'S TREATMENT: 10/05/2023 Activity Comments  Reviewed ankle dorsiflexion and eversion with red band Good form with minimal cues  Standing plantar flexion with eccentric control, LLE 10 reps   Backwards walking   Heel/toe walking at counter 3 reps>heel/toe march 2 reps forward/back   Monster walk forward/back at counter, then no support, 3 reps 3#  Gait activities:  fast/slow, stop/start, head motions, quick turns, fast walk x 200 ft 1 episode of foot catching, slight foot slap with fatigue  Standing on incline: Feet apart EO head turns/nods, EC 3 x 30 sec Forward<>back step and weightshift x 10 reps each foot Occasional mild LOB            PATIENT EDUCATION: Education details: Update to HEP-see above Person educated: Patient Education method: Explanation Education comprehension: verbalized understanding    HOME EXERCISE PROGRAM  Access Code: [email or web address] URL: [email or web address] Date: 10/05/2023 Prepared by: [facility] - Outpatient  Rehab - [facility]  Exercises - Side Stepping with Resistance at Ankles  and Counter Support  - 1 x daily - 7 x weekly - 1-3 sets - 1-2 minute rounds hold - Mini Squat  - 1 x daily - 5 x weekly - 2 sets - 10 reps - Standing Hip Abduction with Ankle Weight  - 1 x daily - 4 x weekly - 3 sets - 10 reps - Standing Hip Extension with Ankle Weight  - 1 x daily - 4 x weekly - 3 sets - 10 reps - Standing Knee Flexion with Ankle Weight  - 1 x daily - 4 x weekly - 3 sets - 10 reps - Single Leg Stance  - 1 x daily - 5 x weekly - 2 sets - 10 reps - Standing Gastroc Stretch at Counter  - 1 x daily - 7 x weekly - 1 sets - 3 reps - 30 sec hold - Long Sitting Ankle Eversion with Resistance  - 1 x daily - 5 x weekly - 2 sets - 10 reps - Seated Ankle Dorsiflexion with Resistance  - 1 x daily - 5 x weekly - 2 sets - 10 reps - Single Leg Squat with Chair Touch  - 1 x daily - 5 x weekly - 2 sets - 10 reps - Standing Single Leg Heel Raise  - 1 x daily - 5 x weekly - 2 sets - 5-10 reps - Backward Walking with Counter Support  - 1 x daily - 7 x weekly - 1-2 sets - 2 minutes hold - Forward and Backward Monster Walk with Resistance at Ankles and Counter Support  - 1 x daily - 5 x weekly - 1 sets - 3-5 reps     Below measures were taken at time of initial evaluation unless otherwise specified:   DIAGNOSTIC FINDINGS: surgical procedure  COGNITION: Overall cognitive status: Within functional limits for tasks assessed   SENSATION: WFL  COORDINATION: Impaired LLE due to weakness  EDEMA:  none  MUSCLE TONE: LLE: Flaccid dorsiflexors/toe extensors, plantarflexors. Left hamstrings  MUSCLE LENGTH: WNL, developing some tightness in left ankle dorsiflexion due to paralysis  DTRs:  NT  POSTURE: No Significant postural limitations  LOWER EXTREMITY ROM:     Active  Right Eval Left Eval  Hip flexion 120 100  Hip extension    Hip abduction    Hip adduction    Hip internal rotation    Hip external rotation    Knee flexion 120 120  Knee extension 0 0  Ankle dorsiflexion 20  0  Ankle plantarflexion 35 0  Ankle inversion    Ankle eversion     (Blank rows = not tested)  LLE limited due to weakness Updated MMT taken 08/05/2023 MMT:  L ankle dorsiflexion 3-/5  L ankle eversion 3-/5  L ankle plantar flexion3-/5  L hamstrings 3+/5  L quads 4/5  LOWER EXTREMITY MMT:    MMT Right Eval Left Eval  Hip flexion 5 3+  Hip extension    Hip abduction 5 3  Hip adduction 5 5  Hip internal rotation    Hip external rotation    Knee flexion 5 3-  Knee extension 5 3+  Ankle dorsiflexion 5 0  Ankle plantarflexion 5 0  Ankle inversion 5 0  Ankle eversion 5 0  (Blank rows = not tested)  BED MOBILITY:  indep  TRANSFERS: Assistive device utilized: Environmental consultant - 2 wheeled  Sit to stand: Complete Independence and Modified independence Stand to sit: Complete Independence and Modified independence Chair to chair: Modified independence Floor:  NT  RAMP:  Level of Assistance: SBA Assistive device utilized: Environmental consultant - 2 wheeled Ramp Comments:   CURB:  Level of Assistance: SBA and CGA Assistive device utilized: Environmental consultant - 2 wheeled Curb Comments:   STAIRS: Level of Assistance: Modified independence and SBA Stair Negotiation Technique: Step to Pattern with Bilateral Rails Number of Stairs: 12  Height of Stairs: 4-6"  Comments: pt negotiating 38 steps to her apartment daily  GAIT: Gait pattern: step to pattern Distance walked:  Assistive device utilized: Environmental consultant - 2 wheeled Level of assistance: Modified independence Comments: use of fixed left AFO  FUNCTIONAL TESTS:  5 times sit to stand: 25 sec Timed up and go (TUG): 30 sec w/ RW and left AFO Berg Balance Scale: 42/56 Gait speed: TBD  TODAY'S TREATMENT:                                                                                                                              DATE: 06/23/23    PATIENT EDUCATION: Education details: assessment details, HEP initiated Person educated: Patient Education  method: Chief Technology Officer Education comprehension: needs further education  HOME EXERCISE PROGRAM: Access Code: [email or web address] URL: [email or web address] Date: 06/23/2023 Prepared by: Shary Decamp  Exercises - Seated Hamstring Curls with Resistance  - 1 x daily - 7 x weekly - 3 sets - 10 reps - Seated Knee Extension with Resistance  - 1 x daily - 7 x weekly -  3 sets - 10 reps - Side Stepping with Resistance at Ankles and Counter Support  - 1 x daily - 7 x weekly - 1-3 sets - 1-2 minute rounds hold  GOALS: Goals reviewed with patient? Yes  LONG TERM GOALS: UPDATED TARGET DATE:  11/19/2023    Pt will be independent with progression of HEP and transition to community fitness for improved strength, balance, and gait. Baseline: last updated mid October 2024 Goal status: INITIAL  Pt will improve FGA score to at least 27/30 to demonstrate improved functional strength and transfer efficiency. Baseline: 19/30>24/30 09/22/2023 Goal status: REVISED   Pt will improve to at least 1250 ft  for improved gait and return to outdoor walking at park. Baseline: 850 ft>1110 ft 09/22/2023 Goal status: REVISED  Pt will improve gait velocity to at least 3 ft/sec for improved gait efficiency and safety. Baseline: 2.69 ft/sec>2.74 ft/sec 09/22/2023 Goal status: IN PROGRESS    ASSESSMENT:  CLINICAL IMPRESSION: Reviewed some of pt's most recent HEP exercises and they remain appropriate.  Reviewed resisted band exercises for ankle dorsiflexion and eversion, with pt needing cues for eversion to avoid hip motion.  She is able to isolate eversion with cues.  Took out inversion exercise from HEP.  Did work on higher level balance and gait activities, adding monster walks and tandem gait/march for hip stability and balance.  She does have occasional LOB with L foot catching with gait activities and she continues to work on exercises at home.  She is progressing towards LTGS.  OBJECTIVE  IMPAIRMENTS: Abnormal gait, decreased activity tolerance, decreased balance, decreased coordination, decreased endurance, decreased knowledge of use of DME, decreased mobility, difficulty walking, decreased ROM, decreased strength, and impaired tone.   ACTIVITY LIMITATIONS: carrying, lifting, stairs, transfers, and locomotion level  PARTICIPATION LIMITATIONS: meal prep, cleaning, laundry, driving, shopping, community activity, and occupation  PERSONAL FACTORS: Time since onset of injury/illness/exacerbation and 1 comorbidity: PMH  are also affecting patient's functional outcome.   REHAB POTENTIAL: Excellent  CLINICAL DECISION MAKING: Evolving/moderate complexity  EVALUATION COMPLEXITY: Moderate  PLAN:  PT FREQUENCY: 2x/month  PT DURATION: other: 2 months  (per recert 09/23/2023)  PLANNED INTERVENTIONS: Therapeutic exercises, Therapeutic activity, Neuromuscular re-education, Balance training, Gait training, Patient/Family education, Self Care, Joint mobilization, Stair training, Vestibular training, Canalith repositioning, Orthotic/Fit training, DME instructions, Aquatic Therapy, Dry Needling, Electrical stimulation, Spinal mobilization, Cryotherapy, Moist heat, Taping, Ionotophoresis 4mg /ml Dexamethasone, and Manual therapy  PLAN FOR NEXT SESSION: Review updates to HEP.  Gait and balance work; continue hip and ankle strengthening LLE; consider compliant surfaces to work ankle strength.   Lonia Blood, PT 10/05/23 1:18 PM Phone: [phone] Fax: [phone]  [facility] at [facility] [address] [place], [address] [place], [place] [address] Phone # [phone] Fax # [phone]

## 2023-10-20 ENCOUNTER — Ambulatory Visit: Payer: Medicaid Other | Admitting: Physical Therapy

## 2023-10-20 DIAGNOSIS — R2689 Other abnormalities of gait and mobility: Secondary | ICD-10-CM

## 2023-10-20 DIAGNOSIS — M6281 Muscle weakness (generalized): Secondary | ICD-10-CM

## 2023-10-20 DIAGNOSIS — R2681 Unsteadiness on feet: Secondary | ICD-10-CM

## 2023-10-20 NOTE — Therapy (Signed)
OUTPATIENT PHYSICAL THERAPY NEURO TREATMENT   Patient Name: Alisha Thomas MRN: [id number] DOB:[birth year], 52 y.o., female Today's Date: 10/21/2023   PCP: Jordan Hawks, PA-C REFERRING PROVIDER: Carrolyn Leigh, FNP  END OF SESSION:  PT End of Session - 10/21/23 1353     Visit Number 20    Number of Visits 22    Date for PT Re-Evaluation 11/19/23   per recert   Authorization Type [employer] Medicaid-auth not required    Authorization - Visit Number 20    Authorization - Number of Visits 27   combined (but not getting OT)   PT Start Time 1539    PT Stop Time 1621    PT Time Calculation (min) 42 min    Activity Tolerance Patient tolerated treatment well    Behavior During Therapy WFL for tasks assessed/performed                             Past Medical History:  Diagnosis Date   Hypertension    Ovarian cyst    Pelvic adhesions    Stomach ulcer    Past Surgical History:  Procedure Laterality Date   BRAIN TUMOR EXCISION     BREAST CYST ASPIRATION Left 2021   ECTOPIC PREGNANCY SURGERY     OVARIAN CYST SURGERY     PELVIC LAPAROSCOPY  11/30/2006   Diag Lap lysis of adhesions   Patient Active Problem List   Diagnosis Date Noted   Symptomatic anemia 03/26/2020   Iron deficiency anemia due to chronic blood loss 02/24/2018   Fibroids 07/04/2015   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   Peptic ulcer 09/10/2011   Ovarian cyst    Pelvic adhesions     ONSET DATE: 04/22/23  REFERRING DIAG: Z74.09 (ICD-10-CM) - Other reduced mobility Z78.9 (ICD-10-CM) - Other specified health status 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.0 (ICD-10-CM) - Benign neoplasm of cerebral meninges Z98.890 (ICD-10-CM) - Other specified postprocedural states  THERAPY DIAG:  Muscle weakness (generalized)  Unsteadiness on feet  Other abnormalities of gait and mobility  Rationale for Evaluation and Treatment: Rehabilitation  SUBJECTIVE:                                                                                                                                                                                              SUBJECTIVE STATEMENT: Want to keep working on strength-I've been able to try jumping outside, and I have joined [employer] (not gone yet).  Still have trouble when I try to stand on one foot and put on my pants.  Pt accompanied by: self  PERTINENT HISTORY: hx of  meningoma   PAIN:  Are you having pain? No  PRECAUTIONS: Fall  RED FLAGS: None   WEIGHT BEARING RESTRICTIONS: No  FALLS: Has patient fallen in last 6 months? No  LIVING ENVIRONMENT: Lives with: lives with their family Lives in: House/apartment Stairs: Yes: External: 38 steps; can reach both Has following equipment at home: Walker - 2 wheeled, shower seat  PLOF: Independent with basic ADLs and Independent with household mobility with device Works in schools as Runner, broadcasting/film/video PATIENT GOALS: improve walking  OBJECTIVE:    TODAY'S TREATMENT: 10/20/2023 Activity Comments  Treadmill gait training x 5 minutes, 1.5>2 mph BUE support and supervision.  PT provides cues for L foot clearance/heelstrike, occasionally  Single limb step up, 10 reps each leg, then 2nd set with UE lifts 6" step  Single limb step up 5 reps, to 4" aerobic step, attempt to lift weighted ball Needs occasional UE support with LLE as stance  Side step/together over obstacle 2 x10 reps, then wide squat/shift and lift (wide hamstring curl) x 10 3# ankle weight  Standing hamstring curl x 10 reps 3#  Stand at edge of step for gastroc stretch>up on toes, 5 reps 5 sec   Standing on aerobic mat with EO/EC feet apart/feet together This surface likely too firm as it's easy, but pt does shift more weight to RLE   Access Code: [email or web address] URL: [email or web address] Date: 10/20/2023 Prepared by: [facility] - Outpatient  Rehab - [facility]  Program Notes Squat lift:Put the ankle weights on.  Start with feet wide.  Squat, shift to the right and lift left leg.   Then squat, shift to the left and lift right leg.  Repeat 10 times, 3 sets.  Exercises - Side Stepping with Resistance at Ankles and Counter Support  - 1 x daily - 7 x weekly - 1-3 sets - 1-2 minute rounds hold - Mini Squat  - 1 x daily - 5 x weekly - 2 sets - 10 reps - Standing Hip Abduction with Ankle Weight  - 1 x daily - 4 x weekly - 3 sets - 10 reps - Standing Hip Extension with Ankle Weight  - 1 x daily - 4 x weekly - 3 sets - 10 reps - Standing Knee Flexion with Ankle Weight  - 1 x daily - 4 x weekly - 3 sets - 10 reps - Single Leg Stance  - 1 x daily - 5 x weekly - 2 sets - 10 reps - Standing Gastroc Stretch at Counter  - 1 x daily - 7 x weekly - 1 sets - 3 reps - 30 sec hold - Long Sitting Ankle Eversion with Resistance  - 1 x daily - 5 x weekly - 2 sets - 10 reps - Seated Ankle Dorsiflexion with Resistance  - 1 x daily - 5 x weekly - 2 sets - 10 reps - Single Leg Squat with Chair Touch  - 1 x daily - 5 x weekly - 2 sets - 10 reps - Standing Single Leg Heel Raise  - 1 x daily - 5 x weekly - 2 sets - 5-10 reps - Backward Walking with Counter Support  - 1 x daily - 7 x weekly - 1-2 sets - 2 minutes hold - Forward and Backward Monster Walk with Resistance at Ankles and Counter Support  - 1 x daily - 5 x weekly - 1 sets - 3-5 reps - Walking Tandem Stance  - 1 x daily - 5 x weekly - 1  sets - 3-5 reps - Side Stepping with Counter Support  - 1 x daily - 5 x weekly - 3 sets - 10 reps - Runner's Step Up/Down  - 1 x daily - 5 x weekly - 3 sets - 10 reps      PATIENT EDUCATION: Education details: Update to HEP-see above Person educated: Patient Education method: Explanation Education comprehension: verbalized understanding       Below measures were taken at time of initial evaluation unless otherwise specified:   DIAGNOSTIC FINDINGS: surgical procedure  COGNITION: Overall cognitive status: Within functional limits for tasks  assessed   SENSATION: WFL  COORDINATION: Impaired LLE due to weakness  EDEMA:  none  MUSCLE TONE: LLE: Flaccid dorsiflexors/toe extensors, plantarflexors. Left hamstrings   MUSCLE LENGTH: WNL, developing some tightness in left ankle dorsiflexion due to paralysis  DTRs:  NT  POSTURE: No Significant postural limitations  LOWER EXTREMITY ROM:     Active  Right Eval Left Eval  Hip flexion 120 100  Hip extension    Hip abduction    Hip adduction    Hip internal rotation    Hip external rotation    Knee flexion 120 120  Knee extension 0 0  Ankle dorsiflexion 20 0  Ankle plantarflexion 35 0  Ankle inversion    Ankle eversion     (Blank rows = not tested)  LLE limited due to weakness Updated MMT taken 08/05/2023 MMT:  L ankle dorsiflexion 3-/5  L ankle eversion 3-/5  L ankle plantar flexion3-/5  L hamstrings 3+/5  L quads 4/5  LOWER EXTREMITY MMT:    MMT Right Eval Left Eval  Hip flexion 5 3+  Hip extension    Hip abduction 5 3  Hip adduction 5 5  Hip internal rotation    Hip external rotation    Knee flexion 5 3-  Knee extension 5 3+  Ankle dorsiflexion 5 0  Ankle plantarflexion 5 0  Ankle inversion 5 0  Ankle eversion 5 0  (Blank rows = not tested)  BED MOBILITY:  indep  TRANSFERS: Assistive device utilized: Environmental consultant - 2 wheeled  Sit to stand: Complete Independence and Modified independence Stand to sit: Complete Independence and Modified independence Chair to chair: Modified independence Floor:  NT  RAMP:  Level of Assistance: SBA Assistive device utilized: Environmental consultant - 2 wheeled Ramp Comments:   CURB:  Level of Assistance: SBA and CGA Assistive device utilized: Environmental consultant - 2 wheeled Curb Comments:   STAIRS: Level of Assistance: Modified independence and SBA Stair Negotiation Technique: Step to Pattern with Bilateral Rails Number of Stairs: 12  Height of Stairs: 4-6"  Comments: pt negotiating 38 steps to her apartment daily  GAIT: Gait  pattern: step to pattern Distance walked:  Assistive device utilized: Environmental consultant - 2 wheeled Level of assistance: Modified independence Comments: use of fixed left AFO  FUNCTIONAL TESTS:  5 times sit to stand: 25 sec Timed up and go (TUG): 30 sec w/ RW and left AFO Berg Balance Scale: 42/56 Gait speed: TBD  TODAY'S TREATMENT:  DATE: 06/23/23    PATIENT EDUCATION: Education details: assessment details, HEP initiated Person educated: Patient Education method: Chief Technology Officer Education comprehension: needs further education  HOME EXERCISE PROGRAM: Access Code: [email or web address] URL: [email or web address] Date: 06/23/2023 Prepared by: Shary Decamp  Exercises - Seated Hamstring Curls with Resistance  - 1 x daily - 7 x weekly - 3 sets - 10 reps - Seated Knee Extension with Resistance  - 1 x daily - 7 x weekly - 3 sets - 10 reps - Side Stepping with Resistance at Ankles and Counter Support  - 1 x daily - 7 x weekly - 1-3 sets - 1-2 minute rounds hold  GOALS: Goals reviewed with patient? Yes  LONG TERM GOALS: UPDATED TARGET DATE:  11/19/2023    Pt will be independent with progression of HEP and transition to community fitness for improved strength, balance, and gait. Baseline: last updated mid October 2024 Goal status: INITIAL  Pt will improve FGA score to at least 27/30 to demonstrate improved functional strength and transfer efficiency. Baseline: 19/30>24/30 09/22/2023 Goal status: REVISED   Pt will improve to at least 1250 ft  for improved gait and return to outdoor walking at park. Baseline: 850 ft>1110 ft 09/22/2023 Goal status: REVISED  Pt will improve gait velocity to at least 3 ft/sec for improved gait efficiency and safety. Baseline: 2.69 ft/sec>2.74 ft/sec 09/22/2023 Goal status: IN PROGRESS    ASSESSMENT:  CLINICAL  IMPRESSION: Pt continues to report and demo improved LLE strength, with ability to perform treadmill gait, full PT session, then ambulate out without foot drag episodes.  She demo that she is able to do a double foot jump, approximately 6 inches, and reports she is planning to go to [facility].  She does report some continued LLE strength deficits, that are most noticeable when she is performing dynamic SLS activity on LLE.  Worked with weights (like pt has at home) for sidestepping, wide squat lifts and step ups, which pt feels she can safely add to HEP.  She will continue to benefit from skilled PT to progress strengthening exercises for return to high level balance and functional mobility.  OBJECTIVE IMPAIRMENTS: Abnormal gait, decreased activity tolerance, decreased balance, decreased coordination, decreased endurance, decreased knowledge of use of DME, decreased mobility, difficulty walking, decreased ROM, decreased strength, and impaired tone.   ACTIVITY LIMITATIONS: carrying, lifting, stairs, transfers, and locomotion level  PARTICIPATION LIMITATIONS: meal prep, cleaning, laundry, driving, shopping, community activity, and occupation  PERSONAL FACTORS: Time since onset of injury/illness/exacerbation and 1 comorbidity: PMH  are also affecting patient's functional outcome.   REHAB POTENTIAL: Excellent  CLINICAL DECISION MAKING: Evolving/moderate complexity  EVALUATION COMPLEXITY: Moderate  PLAN:  PT FREQUENCY: 2x/month  PT DURATION: other: 2 months  (per recert 09/23/2023)  PLANNED INTERVENTIONS: Therapeutic exercises, Therapeutic activity, Neuromuscular re-education, Balance training, Gait training, Patient/Family education, Self Care, Joint mobilization, Stair training, Vestibular training, Canalith repositioning, Orthotic/Fit training, DME instructions, Aquatic Therapy, Dry Needling, Electrical stimulation, Spinal mobilization, Cryotherapy, Moist heat, Taping, Ionotophoresis 4mg /ml  Dexamethasone, and Manual therapy  PLAN FOR NEXT SESSION: Review updates to HEP.  Gait and balance work; continue hip and ankle strengthening LLE; consider compliant surfaces to work ankle strength.   Lonia Blood, PT 10/21/23 1:54 PM Phone: [phone] Fax: [phone]  [facility] at [facility] [address] [place], [address] [place], [place] [address] Phone # [phone] Fax # [phone]

## 2023-11-03 ENCOUNTER — Encounter: Payer: Self-pay | Admitting: Physical Therapy

## 2023-11-03 ENCOUNTER — Ambulatory Visit: Payer: Medicaid Other | Attending: Nurse Practitioner | Admitting: Physical Therapy

## 2023-11-03 DIAGNOSIS — R2681 Unsteadiness on feet: Secondary | ICD-10-CM | POA: Diagnosis present

## 2023-11-03 DIAGNOSIS — M6281 Muscle weakness (generalized): Secondary | ICD-10-CM | POA: Diagnosis present

## 2023-11-03 DIAGNOSIS — R2689 Other abnormalities of gait and mobility: Secondary | ICD-10-CM | POA: Diagnosis present

## 2023-11-03 NOTE — Therapy (Signed)
OUTPATIENT PHYSICAL THERAPY NEURO TREATMENT   Patient Name: Alisha Thomas MRN: [id number] DOB:[birth year], 52 y.o., female Today's Date: 11/03/2023   PCP: Jordan Hawks, PA-C REFERRING PROVIDER: Carrolyn Leigh, FNP  END OF SESSION:  PT End of Session - 11/03/23 1531     Visit Number 21    Number of Visits 22    Date for PT Re-Evaluation 11/19/23   per recert   Authorization Type [employer]-auth not required    Authorization - Visit Number 21    Authorization - Number of Visits 27   combined (but not getting OT)   PT Start Time 1532    PT Stop Time 1614    PT Time Calculation (min) 42 min    Activity Tolerance Patient tolerated treatment well    Behavior During Therapy WFL for tasks assessed/performed                              Past Medical History:  Diagnosis Date   Hypertension    Ovarian cyst    Pelvic adhesions    Stomach ulcer    Past Surgical History:  Procedure Laterality Date   BRAIN TUMOR EXCISION     BREAST CYST ASPIRATION Left 2021   ECTOPIC PREGNANCY SURGERY     OVARIAN CYST SURGERY     PELVIC LAPAROSCOPY  11/30/2006   Diag Lap lysis of adhesions   Patient Active Problem List   Diagnosis Date Noted   Symptomatic anemia 03/26/2020   Iron deficiency anemia due to chronic blood loss 02/24/2018   Fibroids 07/04/2015   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   Peptic ulcer 09/10/2011   Ovarian cyst    Pelvic adhesions     ONSET DATE: 04/22/23  REFERRING DIAG: Z74.09 (ICD-10-CM) - Other reduced mobility Z78.9 (ICD-10-CM) - Other specified health status D32.0 (ICD-10-CM) - Benign neoplasm of cerebral meninges Z98.890 (ICD-10-CM) - Other specified postprocedural states  THERAPY DIAG:  Muscle weakness (generalized)  Unsteadiness on feet  Other abnormalities of gait and mobility  Rationale for Evaluation and Treatment: Rehabilitation  SUBJECTIVE:                                                                                                                                                                                              SUBJECTIVE STATEMENT: Thanksgiving was wonderful.  Have been working a lot on the new exercises and outside.  Tried the bike and [employer] and Treadmill at [employer]  Pt accompanied by: self  PERTINENT HISTORY: hx of meningoma   PAIN:  Are you having pain? No  PRECAUTIONS: Fall  RED FLAGS: None   WEIGHT BEARING RESTRICTIONS: No  FALLS: Has patient fallen in last 6 months? No  LIVING ENVIRONMENT: Lives with: lives with their family Lives in: House/apartment Stairs: Yes: External: 38 steps; can reach both Has following equipment at home: Walker - 2 wheeled, shower seat  PLOF: Independent with basic ADLs and Independent with household mobility with device Works in schools as Runner, broadcasting/film/video PATIENT GOALS: improve walking  OBJECTIVE:    TODAY'S TREATMENT: 11/03/2023 Activity Comments  Reviewed HEP additions and provided progressions of HEP (lateral weightshift and lift UE), then forward step up with BUE lift Good return demo More unstable on LLE, but able to let go of UE support with step ups  Gait activities: -single UE suitcase carry -BUE suitcase carry -gait with head turns/head nods -forward march with alt UE lifts 6-7# weight Cues for upright posture, abdominal activation  Compliant surface work on [employer] beam: -ant/post weightshift x 10 -standing feet apart EO/EC with head turns/nods -LLE as stance with RLE step taps to cones, then alt leg step taps to cones -tandem stance>tandem gait forward/back along beam -sidestepping along beam              Access Code: [email or web address] URL: [email or web address] Date: 11/03/2023 Prepared by: [facility] - Outpatient  Rehab - [facility]  Program Notes Squat lift:Put the ankle weights on.  Start with feet wide.  Squat, shift to the right and lift left leg.  Then squat, shift to the left and lift right leg.  Repeat 10 times, 3  sets.  Exercises - Side Stepping with Resistance at Ankles and Counter Support  - 1 x daily - 7 x weekly - 1-3 sets - 1-2 minute rounds hold - Mini Squat  - 1 x daily - 5 x weekly - 2 sets - 10 reps - Standing Hip Abduction with Ankle Weight  - 1 x daily - 4 x weekly - 3 sets - 10 reps - Standing Hip Extension with Ankle Weight  - 1 x daily - 4 x weekly - 3 sets - 10 reps - Standing Knee Flexion with Ankle Weight  - 1 x daily - 4 x weekly - 3 sets - 10 reps - Single Leg Stance  - 1 x daily - 5 x weekly - 2 sets - 10 reps - Standing Gastroc Stretch at Counter  - 1 x daily - 7 x weekly - 1 sets - 3 reps - 30 sec hold - Long Sitting Ankle Eversion with Resistance  - 1 x daily - 5 x weekly - 2 sets - 10 reps - Seated Ankle Dorsiflexion with Resistance  - 1 x daily - 5 x weekly - 2 sets - 10 reps - Single Leg Squat with Chair Touch  - 1 x daily - 5 x weekly - 2 sets - 10 reps - Standing Single Leg Heel Raise  - 1 x daily - 5 x weekly - 2 sets - 5-10 reps - Backward Walking with Counter Support  - 1 x daily - 7 x weekly - 1-2 sets - 2 minutes hold - Forward and Backward Monster Walk with Resistance at Ankles and Counter Support  - 1 x daily - 5 x weekly - 1 sets - 3-5 reps - Walking Tandem Stance  - 1 x daily - 5 x weekly - 1 sets - 3-5 reps - Side Stepping with Counter Support  - 1 x daily - 5 x weekly - 3 sets -  10 reps - Runner's Step Up/Down  - 1 x daily - 5 x weekly - 3 sets - 10 reps - Kettlebell Suitcase Carry  - 1 x daily - 4 x weekly - 1 sets - 3-5 reps       PATIENT EDUCATION: Education details: Update to HEP-see above Person educated: Patient Education method: Explanation Education comprehension: verbalized understanding       Below measures were taken at time of initial evaluation unless otherwise specified:   DIAGNOSTIC FINDINGS: surgical procedure  COGNITION: Overall cognitive status: Within functional limits for tasks  assessed   SENSATION: WFL  COORDINATION: Impaired LLE due to weakness  EDEMA:  none  MUSCLE TONE: LLE: Flaccid dorsiflexors/toe extensors, plantarflexors. Left hamstrings   MUSCLE LENGTH: WNL, developing some tightness in left ankle dorsiflexion due to paralysis  DTRs:  NT  POSTURE: No Significant postural limitations  LOWER EXTREMITY ROM:     Active  Right Eval Left Eval  Hip flexion 120 100  Hip extension    Hip abduction    Hip adduction    Hip internal rotation    Hip external rotation    Knee flexion 120 120  Knee extension 0 0  Ankle dorsiflexion 20 0  Ankle plantarflexion 35 0  Ankle inversion    Ankle eversion     (Blank rows = not tested)  LLE limited due to weakness Updated MMT taken 08/05/2023 MMT:  L ankle dorsiflexion 3-/5  L ankle eversion 3-/5  L ankle plantar flexion3-/5  L hamstrings 3+/5  L quads 4/5  LOWER EXTREMITY MMT:    MMT Right Eval Left Eval  Hip flexion 5 3+  Hip extension    Hip abduction 5 3  Hip adduction 5 5  Hip internal rotation    Hip external rotation    Knee flexion 5 3-  Knee extension 5 3+  Ankle dorsiflexion 5 0  Ankle plantarflexion 5 0  Ankle inversion 5 0  Ankle eversion 5 0  (Blank rows = not tested)  BED MOBILITY:  indep  TRANSFERS: Assistive device utilized: Environmental consultant - 2 wheeled  Sit to stand: Complete Independence and Modified independence Stand to sit: Complete Independence and Modified independence Chair to chair: Modified independence Floor:  NT  RAMP:  Level of Assistance: SBA Assistive device utilized: Environmental consultant - 2 wheeled Ramp Comments:   CURB:  Level of Assistance: SBA and CGA Assistive device utilized: Environmental consultant - 2 wheeled Curb Comments:   STAIRS: Level of Assistance: Modified independence and SBA Stair Negotiation Technique: Step to Pattern with Bilateral Rails Number of Stairs: 12  Height of Stairs: 4-6"  Comments: pt negotiating 38 steps to her apartment daily  GAIT: Gait  pattern: step to pattern Distance walked:  Assistive device utilized: Environmental consultant - 2 wheeled Level of assistance: Modified independence Comments: use of fixed left AFO  FUNCTIONAL TESTS:  5 times sit to stand: 25 sec Timed up and go (TUG): 30 sec w/ RW and left AFO Berg Balance Scale: 42/56 Gait speed: TBD  TODAY'S TREATMENT:  DATE: 06/23/23    PATIENT EDUCATION: Education details: assessment details, HEP initiated Person educated: Patient Education method: Chief Technology Officer Education comprehension: needs further education  HOME EXERCISE PROGRAM: Access Code: [email or web address] URL: [email or web address] Date: 06/23/2023 Prepared by: Shary Decamp  Exercises - Seated Hamstring Curls with Resistance  - 1 x daily - 7 x weekly - 3 sets - 10 reps - Seated Knee Extension with Resistance  - 1 x daily - 7 x weekly - 3 sets - 10 reps - Side Stepping with Resistance at Ankles and Counter Support  - 1 x daily - 7 x weekly - 1-3 sets - 1-2 minute rounds hold  GOALS: Goals reviewed with patient? Yes  LONG TERM GOALS: UPDATED TARGET DATE:  11/19/2023    Pt will be independent with progression of HEP and transition to community fitness for improved strength, balance, and gait. Baseline: last updated mid October 2024 Goal status: INITIAL  Pt will improve FGA score to at least 27/30 to demonstrate improved functional strength and transfer efficiency. Baseline: 19/30>24/30 09/22/2023 Goal status: REVISED   Pt will improve to at least 1250 ft  for improved gait and return to outdoor walking at park. Baseline: 850 ft>1110 ft 09/22/2023 Goal status: REVISED  Pt will improve gait velocity to at least 3 ft/sec for improved gait efficiency and safety. Baseline: 2.69 ft/sec>2.74 ft/sec 09/22/2023 Goal status: IN PROGRESS    ASSESSMENT:  CLINICAL  IMPRESSION: Skilled PT session continued to work on high level strength and balance activities.  She return demo understanding of HEP additions from last week and does well with progressions of HEP.  She is challenged with coordination of UE/lower extremity motions with marching and gait activities, with narrowed BOS, but she recovers without full LOB.  She is continuing to improve overall strength and balance.  Anticipate checking goals and discharging pt next visit.    OBJECTIVE IMPAIRMENTS: Abnormal gait, decreased activity tolerance, decreased balance, decreased coordination, decreased endurance, decreased knowledge of use of DME, decreased mobility, difficulty walking, decreased ROM, decreased strength, and impaired tone.   ACTIVITY LIMITATIONS: carrying, lifting, stairs, transfers, and locomotion level  PARTICIPATION LIMITATIONS: meal prep, cleaning, laundry, driving, shopping, community activity, and occupation  PERSONAL FACTORS: Time since onset of injury/illness/exacerbation and 1 comorbidity: PMH  are also affecting patient's functional outcome.   REHAB POTENTIAL: Excellent  CLINICAL DECISION MAKING: Evolving/moderate complexity  EVALUATION COMPLEXITY: Moderate  PLAN:  PT FREQUENCY: 2x/month  PT DURATION: other: 2 months  (per recert 09/23/2023)  PLANNED INTERVENTIONS: Therapeutic exercises, Therapeutic activity, Neuromuscular re-education, Balance training, Gait training, Patient/Family education, Self Care, Joint mobilization, Stair training, Vestibular training, Canalith repositioning, Orthotic/Fit training, DME instructions, Aquatic Therapy, Dry Needling, Electrical stimulation, Spinal mobilization, Cryotherapy, Moist heat, Taping, Ionotophoresis 4mg /ml Dexamethasone, and Manual therapy  PLAN FOR NEXT SESSION: Review updates to HEP.  Check LTGs and plan for discharge.   Lonia Blood, PT 11/03/23 5:08 PM Phone: [phone] Fax: [phone]  [facility]  at [facility] [address] [place], [address] [place], [place] [address] Phone # [phone] Fax # [phone]

## 2023-11-17 ENCOUNTER — Encounter: Payer: Self-pay | Admitting: Physical Therapy

## 2023-11-17 ENCOUNTER — Ambulatory Visit: Payer: Medicaid Other | Admitting: Physical Therapy

## 2023-11-17 DIAGNOSIS — R2681 Unsteadiness on feet: Secondary | ICD-10-CM

## 2023-11-17 DIAGNOSIS — M6281 Muscle weakness (generalized): Secondary | ICD-10-CM

## 2023-11-17 DIAGNOSIS — R2689 Other abnormalities of gait and mobility: Secondary | ICD-10-CM

## 2023-11-17 NOTE — Therapy (Signed)
OUTPATIENT PHYSICAL THERAPY NEURO TREATMENT/DISCHARGE SUMMARY   Patient Name: Alisha Thomas MRN: [id number] DOB:1971-02-18, 52 y.o., female Today's Date: 11/17/2023   PCP: Barbarann Ehlers REFERRING PROVIDER: Carrolyn Leigh, FNP   PHYSICAL THERAPY DISCHARGE SUMMARY  Visits from Start of Care: 22  Current functional level related to goals / functional outcomes: Pt has met 3 of 4 LTGs.  See below   Remaining deficits: L LE weakness, foot slap with gait   Education / Equipment: Educated in [employer], progression of exercises, transition to community fitness   Patient agrees to discharge. Patient goals were met. Patient is being discharged due to meeting the stated rehab goals.  END OF SESSION:  PT End of Session - 11/17/23 1534     Visit Number 22    Number of Visits 22    Date for PT Re-Evaluation 11/19/23   per recert   Authorization Type UHC Medicaid-auth not required    Authorization - Visit Number 22    Authorization - Number of Visits 27   combined (but not getting OT)   PT Start Time 1532    PT Stop Time 1606    PT Time Calculation (min) 34 min    Activity Tolerance Patient tolerated treatment well    Behavior During Therapy WFL for tasks assessed/performed                               Past Medical History:  Diagnosis Date   Hypertension    Ovarian cyst    Pelvic adhesions    Stomach ulcer    Past Surgical History:  Procedure Laterality Date   BRAIN TUMOR EXCISION     BREAST CYST ASPIRATION Left 2021   ECTOPIC PREGNANCY SURGERY     OVARIAN CYST SURGERY     PELVIC LAPAROSCOPY  11/30/2006   Diag Lap lysis of adhesions   Patient Active Problem List   Diagnosis Date Noted   Symptomatic anemia 03/26/2020   Iron deficiency anemia due to chronic blood loss 02/24/2018   Fibroids 07/04/2015   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   Peptic ulcer 09/10/2011   Ovarian cyst    Pelvic adhesions     ONSET DATE: 04/22/23  REFERRING DIAG:  Z74.09 (ICD-10-CM) - Other reduced mobility Z78.9 (ICD-10-CM) - Other specified health status D32.0 (ICD-10-CM) - Benign neoplasm of cerebral meninges Z98.890 (ICD-10-CM) - Other specified postprocedural states  THERAPY DIAG:  Muscle weakness (generalized)  Unsteadiness on feet  Other abnormalities of gait and mobility  Rationale for Evaluation and Treatment: Rehabilitation  SUBJECTIVE:  SUBJECTIVE STATEMENT: No changes, still working on [employer] and at home.  Feel like progress is slowing.    Pt accompanied by: self  PERTINENT HISTORY: hx of meningoma   PAIN:  Are you having pain? No  PRECAUTIONS: Fall  RED FLAGS: None   WEIGHT BEARING RESTRICTIONS: No  FALLS: Has patient fallen in last 6 months? No  LIVING ENVIRONMENT: Lives with: lives with their family Lives in: House/apartment Stairs: Yes: External: 38 steps; can reach both Has following equipment at home: Walker - 2 wheeled, shower seat  PLOF: Independent with basic ADLs and Independent with household mobility with device Works in schools as Runner, broadcasting/film/video PATIENT GOALS: improve walking  OBJECTIVE:    TODAY'S TREATMENT: 11/17/2023 Activity Comments  :  1185 ft Improved from 1110 ft  45M walk:  3.18 ft/sec Improved from 2.74 ft/sec  FGA:  27/30 Improved from 24/30  Review of HEP exercises to continue to focus on: Weighted hip abduction, march, hamstring curl Single leg heel raises SLS squats on LLE Squat and weightshift with wts Charlett Lango carry Good return demo  Answered questions on appropriate shoes due to reports of pain in L metatarsal area with foot slap Discussed wearing shoes with more support and padding, use of ice after prolonged exercise, attention to slowed gait pattern to avoid foot slap       [facility] PT  Assessment - 11/17/23 1548       Functional Gait  Assessment   Gait assessed  Yes    Gait Level Surface Walks 20 ft in less than 7 sec but greater than 5.5 sec, uses assistive device, slower speed, mild gait deviations, or deviates 6-10 in outside of the 12 in walkway width.   6.63   Change in Gait Speed Able to smoothly change walking speed without loss of balance or gait deviation. Deviate no more than 6 in outside of the 12 in walkway width.    Gait with Horizontal Head Turns Performs head turns smoothly with slight change in gait velocity (eg, minor disruption to smooth gait path), deviates 6-10 in outside 12 in walkway width, or uses an assistive device.    Gait with Vertical Head Turns Performs head turns with no change in gait. Deviates no more than 6 in outside 12 in walkway width.    Gait and Pivot Turn Pivot turns safely within 3 sec and stops quickly with no loss of balance.    Step Over Obstacle Is able to step over 2 stacked shoe boxes taped together (9 in total height) without changing gait speed. No evidence of imbalance.    Gait with Narrow Base of Support Is able to ambulate for 10 steps heel to toe with no staggering.    Gait with Eyes Closed Walks 20 ft, uses assistive device, slower speed, mild gait deviations, deviates 6-10 in outside 12 in walkway width. Ambulates 20 ft in less than 9 sec but greater than 7 sec.   8.04 sec   Ambulating Backwards Walks 20 ft, no assistive devices, good speed, no evidence for imbalance, normal gait    Steps Alternating feet, no rail.    Total Score 27              Access Code: [email or web address] URL: [email or web address] Date: 11/03/2023 Prepared by: [facility] - Outpatient  Rehab - [facility]  Program Notes Squat lift:Put the ankle weights on.  Start with feet wide.  Squat, shift to the right and lift left leg.  Then squat, shift  to the left and lift right leg.  Repeat 10 times, 3 sets.  Exercises - Side Stepping with  Resistance at Ankles and Counter Support  - 1 x daily - 7 x weekly - 1-3 sets - 1-2 minute rounds hold - Mini Squat  - 1 x daily - 5 x weekly - 2 sets - 10 reps - Standing Hip Abduction with Ankle Weight  - 1 x daily - 4 x weekly - 3 sets - 10 reps - Standing Hip Extension with Ankle Weight  - 1 x daily - 4 x weekly - 3 sets - 10 reps - Standing Knee Flexion with Ankle Weight  - 1 x daily - 4 x weekly - 3 sets - 10 reps - Single Leg Stance  - 1 x daily - 5 x weekly - 2 sets - 10 reps - Standing Gastroc Stretch at Counter  - 1 x daily - 7 x weekly - 1 sets - 3 reps - 30 sec hold - Long Sitting Ankle Eversion with Resistance  - 1 x daily - 5 x weekly - 2 sets - 10 reps - Seated Ankle Dorsiflexion with Resistance  - 1 x daily - 5 x weekly - 2 sets - 10 reps - Single Leg Squat with Chair Touch  - 1 x daily - 5 x weekly - 2 sets - 10 reps - Standing Single Leg Heel Raise  - 1 x daily - 5 x weekly - 2 sets - 5-10 reps - Backward Walking with Counter Support  - 1 x daily - 7 x weekly - 1-2 sets - 2 minutes hold - Forward and Backward Monster Walk with Resistance at Ankles and Counter Support  - 1 x daily - 5 x weekly - 1 sets - 3-5 reps - Walking Tandem Stance  - 1 x daily - 5 x weekly - 1 sets - 3-5 reps - Side Stepping with Counter Support  - 1 x daily - 5 x weekly - 3 sets - 10 reps - Runner's Step Up/Down  - 1 x daily - 5 x weekly - 3 sets - 10 reps - Kettlebell Suitcase Carry  - 1 x daily - 4 x weekly - 1 sets - 3-5 reps       PATIENT EDUCATION: Education details: 11/17/2023:  Progress towards goals, POC, plans for d/c today.   Person educated: Patient Education method: Explanation Education comprehension: verbalized understanding       Below measures were taken at time of initial evaluation unless otherwise specified:   DIAGNOSTIC FINDINGS: surgical procedure  COGNITION: Overall cognitive status: Within functional limits for tasks  assessed   SENSATION: WFL  COORDINATION: Impaired LLE due to weakness  EDEMA:  none  MUSCLE TONE: LLE: Flaccid dorsiflexors/toe extensors, plantarflexors. Left hamstrings   MUSCLE LENGTH: WNL, developing some tightness in left ankle dorsiflexion due to paralysis  DTRs:  NT  POSTURE: No Significant postural limitations  LOWER EXTREMITY ROM:     Active  Right Eval Left Eval  Hip flexion 120 100  Hip extension    Hip abduction    Hip adduction    Hip internal rotation    Hip external rotation    Knee flexion 120 120  Knee extension 0 0  Ankle dorsiflexion 20 0  Ankle plantarflexion 35 0  Ankle inversion    Ankle eversion     (Blank rows = not tested)  LLE limited due to weakness Updated MMT taken 08/05/2023 MMT:  L ankle  dorsiflexion 3-/5  L ankle eversion 3-/5  L ankle plantar flexion3-/5  L hamstrings 3+/5  L quads 4/5  LOWER EXTREMITY MMT:    MMT Right Eval Left Eval  Hip flexion 5 3+  Hip extension    Hip abduction 5 3  Hip adduction 5 5  Hip internal rotation    Hip external rotation    Knee flexion 5 3-  Knee extension 5 3+  Ankle dorsiflexion 5 0  Ankle plantarflexion 5 0  Ankle inversion 5 0  Ankle eversion 5 0  (Blank rows = not tested)  BED MOBILITY:  indep  TRANSFERS: Assistive device utilized: Environmental consultant - 2 wheeled  Sit to stand: Complete Independence and Modified independence Stand to sit: Complete Independence and Modified independence Chair to chair: Modified independence Floor:  NT  RAMP:  Level of Assistance: SBA Assistive device utilized: Environmental consultant - 2 wheeled Ramp Comments:   CURB:  Level of Assistance: SBA and CGA Assistive device utilized: Environmental consultant - 2 wheeled Curb Comments:   STAIRS: Level of Assistance: Modified independence and SBA Stair Negotiation Technique: Step to Pattern with Bilateral Rails Number of Stairs: 12  Height of Stairs: 4-6"  Comments: pt negotiating 38 steps to her apartment daily  GAIT: Gait  pattern: step to pattern Distance walked:  Assistive device utilized: Environmental consultant - 2 wheeled Level of assistance: Modified independence Comments: use of fixed left AFO  FUNCTIONAL TESTS:  5 times sit to stand: 25 sec Timed up and go (TUG): 30 sec w/ RW and left AFO Berg Balance Scale: 42/56 Gait speed: TBD  TODAY'S TREATMENT:                                                                                                                              DATE: 06/23/23    PATIENT EDUCATION: Education details: assessment details, HEP initiated Person educated: Patient Education method: Chief Technology Officer Education comprehension: needs further education  HOME EXERCISE PROGRAM: Access Code: [email or web address] URL: [email or web address] Date: 06/23/2023 Prepared by: Shary Decamp  Exercises - Seated Hamstring Curls with Resistance  - 1 x daily - 7 x weekly - 3 sets - 10 reps - Seated Knee Extension with Resistance  - 1 x daily - 7 x weekly - 3 sets - 10 reps - Side Stepping with Resistance at Ankles and Counter Support  - 1 x daily - 7 x weekly - 1-3 sets - 1-2 minute rounds hold  GOALS: Goals reviewed with patient? Yes  LONG TERM GOALS: UPDATED TARGET DATE:  11/19/2023    Pt will be independent with progression of HEP and transition to community fitness for improved strength, balance, and gait. Baseline: last updated mid October 2024 Goal status: MET 11/17/2023  Pt will improve FGA score to at least 27/30 to demonstrate improved functional strength and transfer efficiency. Baseline: 19/30>24/30 09/22/2023>27/30 Goal status: MET 11/17/2023   Pt will improve to at least 1250 ft  for improved gait and  return to outdoor walking at park. Baseline: 850 ft>1110 ft 09/22/2023>1185 ft 11/17/2023 Goal status: PARTIALLY MET, 11/17/2023  Pt will improve gait velocity to at least 3 ft/sec for improved gait efficiency and safety. Baseline: 2.69 ft/sec>2.74 ft/sec 09/22/2023>3.18  ft/sec Goal status: MET, 11/17/2023    ASSESSMENT:  CLINICAL IMPRESSION: Assessed LTGs this visit, with pt meeting LTG 1, 2, and 4.  She is independent with HEP, she has improved FGA score from initial 19/30 to 27/30; she has improved gait velocity (no device) from 2.69 ft/sec to 3.18 ft/sec.  She has improved 6 MWT distance from 1110 ft to 1185 ft, with improved noted in this test, just not to goal level.  She reports continuing to do her HEP and going to the gym, as well as walking outdoors at the park.  She does continue to have L foot slap with fatigue, and PT educates pt in ideal footwear as well as attention to eccentric control of ankle dorsiflexion to help lessen foot slap.  She has made great progress with therapy, since evaluation needing to ambulate with RW, and now independent with gait in community.  She is appropriate for discharge at this time.  OBJECTIVE IMPAIRMENTS: Abnormal gait, decreased activity tolerance, decreased balance, decreased coordination, decreased endurance, decreased knowledge of use of DME, decreased mobility, difficulty walking, decreased ROM, decreased strength, and impaired tone.   ACTIVITY LIMITATIONS: carrying, lifting, stairs, transfers, and locomotion level  PARTICIPATION LIMITATIONS: meal prep, cleaning, laundry, driving, shopping, community activity, and occupation  PERSONAL FACTORS: Time since onset of injury/illness/exacerbation and 1 comorbidity: PMH  are also affecting patient's functional outcome.   REHAB POTENTIAL: Excellent  CLINICAL DECISION MAKING: Evolving/moderate complexity  EVALUATION COMPLEXITY: Moderate  PLAN:  PT FREQUENCY: 2x/month  PT DURATION: other: 2 months  (per recert 09/23/2023)  PLANNED INTERVENTIONS: Therapeutic exercises, Therapeutic activity, Neuromuscular re-education, Balance training, Gait training, Patient/Family education, Self Care, Joint mobilization, Stair training, Vestibular training, Canalith  repositioning, Orthotic/Fit training, DME instructions, Aquatic Therapy, Dry Needling, Electrical stimulation, Spinal mobilization, Cryotherapy, Moist heat, Taping, Ionotophoresis 4mg /ml Dexamethasone, and Manual therapy  PLAN FOR NEXT SESSION: Discharge PT. Lonia Blood, PT 11/17/23 4:10 PM Phone: [phone] Fax: [phone]  [facility] at [facility] [address] [place], [address] [place], [place] [address] Phone # [phone] Fax # [phone]

## 2023-12-13 ENCOUNTER — Ambulatory Visit: Payer: Medicaid Other | Admitting: Nurse Practitioner

## 2024-01-24 NOTE — Therapy (Unsigned)
 OUTPATIENT PHYSICAL THERAPY NEURO EVALUATION   Patient Name: Alisha Thomas MRN: [id number] DOB:May 25, 1971, 53 y.o., female Today's Date: 01/26/2024   PCP: Jordan Hawks, PA-C  REFERRING PROVIDER: Delrae Alfred, NP   END OF SESSION:  PT End of Session - 01/26/24 1559     Visit Number 1    Number of Visits 7    Date for PT Re-Evaluation 03/08/24    Authorization Type UHC Medicaid-auth not required    Authorization - Number of Visits 27    PT Start Time 1513    PT Stop Time 1558    PT Time Calculation (min) 45 min    Activity Tolerance Patient tolerated treatment well    Behavior During Therapy WFL for tasks assessed/performed             Past Medical History:  Diagnosis Date   Hypertension    Ovarian cyst    Pelvic adhesions    Stomach ulcer    Past Surgical History:  Procedure Laterality Date   BRAIN TUMOR EXCISION     BREAST CYST ASPIRATION Left 2021   ECTOPIC PREGNANCY SURGERY     OVARIAN CYST SURGERY     PELVIC LAPAROSCOPY  11/30/2006   Diag Lap lysis of adhesions   Patient Active Problem List   Diagnosis Date Noted   Symptomatic anemia 03/26/2020   Iron deficiency anemia due to chronic blood loss 02/24/2018   Fibroids 07/04/2015   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   Peptic ulcer 09/10/2011   Ovarian cyst    Pelvic adhesions     ONSET DATE: Right frontal meningoma resection 05/25/23   REFERRING DIAG: D32.9 (ICD-10-CM) - Benign neoplasm of meninges, unspecified R26.89 (ICD-10-CM) - Imbalance  THERAPY DIAG:  Unsteadiness on feet  Muscle weakness (generalized)  Other abnormalities of gait and mobility  Rationale for Evaluation and Treatment: Rehabilitation  SUBJECTIVE:                                                                                                                                                                                             SUBJECTIVE STATEMENT: Patient reports that when walking, she feels like something is  pulling her L leg back. Notes imbalance with turns or head turns, walking between small areas or when sidestepping. Has been going to the gym and working on resisted walking, squats, treadmill walking, strength training. Reports that she has tried returning to work but it is too much walking, thus have not returned yet. Patient reports that she has been going to the gym at least 3x/week.   Pt accompanied by: self  PERTINENT HISTORY: HTN, Right frontal  meningoma resection 05/25/23 with LLE weakness; reports remote R ankle injury   PAIN:  Are you having pain? No  PRECAUTIONS: Fall  RED FLAGS: None   WEIGHT BEARING RESTRICTIONS: No  FALLS: Has patient fallen in last 6 months? 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  DIAGNOSTIC FINDINGS: none recent   COGNITION: Overall cognitive status: Within functional limits for tasks assessed   SENSATION: Pt reports occasional N/T in the L LE at night, otherwise denies   COORDINATION: Alternating pronation/supination: WNL Alternating toe tap: difficult coordinate/recruit dorsiflexors on L  Finger to nose: WNL    MUSCLE TONE: possibly very slight increase in L extensor tone   POSTURE: No Significant postural limitations  LOWER EXTREMITY ROM:     Active  Right Eval Left Eval  Hip flexion    Hip extension    Hip abduction    Hip adduction    Hip internal rotation    Hip external rotation    Knee flexion    Knee extension    Ankle dorsiflexion 12 8  Ankle plantarflexion     Ankle inversion    Ankle eversion     (Blank rows = not tested)  LOWER EXTREMITY MMT:    MMT (tested in standard positioning) Right Eval Left Eval  Hip flexion 5 5  Hip extension 4 4-  Hip abduction 4 4+  Hip adduction 4+ 4  Hip internal rotation    Hip external rotation    Knee flexion 5 5  Knee extension 5 5  Ankle dorsiflexion 5 4  Ankle plantarflexion 10 reps  7 reps   Ankle inversion 4+ 4-  Ankle eversion 4 3+  (Blank rows = not tested)  GAIT: Gait pattern: Occasional slight R intoeing , L steppage, R hip drop when L LE in stance  Assistive device utilized: None Level of assistance: Modified independence   FUNCTIONAL TESTS:   [facility] PT Assessment - 01/26/24 0001       Standardized Balance Assessment   Standardized Balance Assessment 10 meter walk test    10 Meter Walk 10.34 sec   3.17 ft/sec     Functional Gait  Assessment   Gait Level Surface Walks 20 ft in less than 5.5 sec, no assistive devices, good speed, no evidence for imbalance, normal gait pattern, deviates no more than 6 in outside of the 12 in walkway width.    Change in Gait Speed Able to smoothly change walking speed without loss of balance or gait deviation. Deviate no more than 6 in outside of the 12 in walkway width.    Gait with Horizontal Head Turns Performs head turns smoothly with slight change in gait velocity (eg, minor disruption to smooth gait path), deviates 6-10 in outside 12 in walkway width, or uses an assistive device.    Gait with Vertical Head Turns Performs head turns with no change in gait. Deviates no more than 6 in outside 12 in walkway width.    Gait and Pivot Turn Pivot turns safely within  3 sec and stops quickly with no loss of balance.    Step Over Obstacle Is able to step over 2 stacked shoe boxes taped together (9 in total height) without changing gait speed. No evidence of imbalance.    Gait with Narrow Base of Support Is able to ambulate for 10 steps heel to toe with no  staggering.    Gait with Eyes Closed Walks 20 ft, uses assistive device, slower speed, mild gait deviations, deviates 6-10 in outside 12 in walkway width. Ambulates 20 ft in less than 9 sec but greater than 7 sec.    Ambulating Backwards Walks 20 ft, uses assistive device, slower speed, mild gait deviations, deviates 6-10 in outside 12 in walkway width.    Steps Alternating feet, no rail.    Total Score 27                                                                                                                                          TREATMENT DATE: 01/26/24    PATIENT EDUCATION: Education details: edu on exam finding, prognosis, POC, HEP Person educated: Patient Education method: Explanation, Demonstration, Tactile cues, Verbal cues, and Handouts Education comprehension: verbalized understanding and returned demonstration  HOME EXERCISE PROGRAM: Access Code: [email or web address] URL: [email or web address] Date: 01/26/2024 Prepared by: [facility] - Outpatient  Rehab - [facility]  Exercises - Seated Ankle Dorsiflexion with Anchored Resistance  - 1 x daily - 5 x weekly - 2 sets - 10 reps - Single Leg Heel Raise with Counter Support  - 1 x daily - 5 x weekly - 2 sets - 10 reps - Sidelying Hip Abduction  - 1 x daily - 5 x weekly - 2 sets - 10 reps - Single Leg Bridge  - 1 x daily - 5 x weekly - 2 sets - 10 reps  GOALS: Goals reviewed with patient? Yes  SHORT TERM GOALS: Target date: 02/16/2024  Patient to be independent with initial HEP. Baseline: HEP initiated Goal status: INITIAL    LONG TERM GOALS: Target date: 03/08/2024  Patient to be independent with advanced HEP. Baseline: Not yet initiated  Goal status: INITIAL  Patient to demonstrate B LE strength >/=4/5.  Baseline: See above Goal status: INITIAL  Patient to demonstrate 50% improvement in L hip stability when ambulating.  Baseline: Occasional slight R intoeing , L steppage, R hip drop when L LE  stabilizing  Goal status: INITIAL  Patient to report 50% improvement in stability when performing head and body turns.  Baseline: reports sense of imbalance with these activities  Goal status: INITIAL   ASSESSMENT:  CLINICAL IMPRESSION:  Patient is a 53 y/o F presenting to OPPT with c/o L LE weakness s/p R frontal meningoma resection 05/25/23. Patient reports being active at the gym since her last PT POC which ended in December 2024, however notes remaining imbalance and weakness that she  would like to address. Patient today presenting with decreased L dorsiflexion AROM, L hip and ankle weakness, gait deviations, and slight imbalance. Patient was educated on HEP and reported understanding. Would benefit from skilled PT services 1 x/week for 6 weeks to address aforementioned impairments in order to optimize level of function.    OBJECTIVE IMPAIRMENTS: Abnormal gait, decreased balance, decreased coordination, difficulty walking, decreased ROM, and decreased strength.   ACTIVITY LIMITATIONS: carrying, lifting, sitting, standing, squatting, dressing, and locomotion level  PARTICIPATION LIMITATIONS: meal prep, cleaning, laundry, shopping, community activity, occupation, and church  PERSONAL FACTORS: Age, Past/current experiences, Time since onset of injury/illness/exacerbation, and 1 comorbidity: HTN  are also affecting patient's functional outcome.   REHAB POTENTIAL: Good  CLINICAL DECISION MAKING: Evolving/moderate complexity  EVALUATION COMPLEXITY: Moderate  PLAN:  PT FREQUENCY: 1x/week  PT DURATION: 6 weeks  PLANNED INTERVENTIONS: 97164- PT Re-evaluation, 97110-Therapeutic exercises, 97530- Therapeutic activity, 97112- Neuromuscular re-education, 97535- Self Care, 40981- Manual therapy, [phone]- Gait training, [phone]- Canalith repositioning, [id number]- Electrical stimulation (unattended), [phone]- Electrical stimulation (manual), Patient/Family education, Balance training, Stair training,  Taping, Vestibular training, Cryotherapy, and Moist heat  PLAN FOR NEXT SESSION: review HEP and progress for hip and ankle strengthening; high level balance activities including compliant surface, EC, head and body turns   Baldemar Friday, [place], DPT 01/26/24 4:11 PM  [place] [facility] at [facility] [address] [place], [place] [address] Phone # [phone] Fax # [phone]

## 2024-01-26 ENCOUNTER — Ambulatory Visit: Payer: Medicaid Other | Attending: Neurology | Admitting: Physical Therapy

## 2024-01-26 ENCOUNTER — Other Ambulatory Visit: Payer: Self-pay

## 2024-01-26 ENCOUNTER — Encounter: Payer: Self-pay | Admitting: Physical Therapy

## 2024-01-26 DIAGNOSIS — R2681 Unsteadiness on feet: Secondary | ICD-10-CM | POA: Diagnosis present

## 2024-01-26 DIAGNOSIS — M6281 Muscle weakness (generalized): Secondary | ICD-10-CM | POA: Insufficient documentation

## 2024-01-26 DIAGNOSIS — R2689 Other abnormalities of gait and mobility: Secondary | ICD-10-CM | POA: Diagnosis present

## 2024-01-28 ENCOUNTER — Encounter: Payer: Medicaid Other | Admitting: Physician Assistant

## 2024-02-02 ENCOUNTER — Telehealth: Payer: Self-pay

## 2024-02-02 ENCOUNTER — Ambulatory Visit: Payer: Medicaid Other | Attending: Neurology

## 2024-02-02 DIAGNOSIS — R2681 Unsteadiness on feet: Secondary | ICD-10-CM | POA: Insufficient documentation

## 2024-02-02 DIAGNOSIS — M6281 Muscle weakness (generalized): Secondary | ICD-10-CM | POA: Diagnosis present

## 2024-02-02 DIAGNOSIS — R262 Difficulty in walking, not elsewhere classified: Secondary | ICD-10-CM | POA: Insufficient documentation

## 2024-02-02 DIAGNOSIS — R2689 Other abnormalities of gait and mobility: Secondary | ICD-10-CM | POA: Insufficient documentation

## 2024-02-02 NOTE — Therapy (Signed)
 OUTPATIENT PHYSICAL THERAPY NEURO TREATMENT    Patient Name: Alisha Thomas MRN: [id number] DOB:08-02-1971, 53 y.o., female Today's Date: 02/02/2024   PCP: Jordan Hawks, PA-C  REFERRING PROVIDER: Delrae Alfred, NP   END OF SESSION:  PT End of Session - 02/02/24 1530     Visit Number 2    Number of Visits 7    Date for PT Re-Evaluation 03/08/24    Authorization Type U[employer] Medicaid-auth not required    Authorization - Number of Visits 27    PT Start Time 1530    PT Stop Time 1615    PT Time Calculation (min) 45 min    Activity Tolerance Patient tolerated treatment well    Behavior During Therapy WFL for tasks assessed/performed             Past Medical History:  Diagnosis Date   Hypertension    Ovarian cyst    Pelvic adhesions    Stomach ulcer    Past Surgical History:  Procedure Laterality Date   BRAIN TUMOR EXCISION     BREAST CYST ASPIRATION Left 2021   ECTOPIC PREGNANCY SURGERY     OVARIAN CYST SURGERY     PELVIC LAPAROSCOPY  11/30/2006   Diag Lap lysis of adhesions   Patient Active Problem List   Diagnosis Date Noted   Symptomatic anemia 03/26/2020   Iron deficiency anemia due to chronic blood loss 02/24/2018   Fibroids 07/04/2015   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   Peptic ulcer 09/10/2011   Ovarian cyst    Pelvic adhesions     ONSET DATE: Right frontal meningoma resection 05/25/23   REFERRING DIAG: D32.9 (ICD-10-CM) - Benign neoplasm of meninges, unspecified R26.89 (ICD-10-CM) - Imbalance  THERAPY DIAG:  Unsteadiness on feet  Muscle weakness (generalized)  Other abnormalities of gait and mobility  Difficulty in walking, not elsewhere classified  Rationale for Evaluation and Treatment: Rehabilitation  SUBJECTIVE:                                                                                                                                                                                             SUBJECTIVE STATEMENT: Doing ok, no  no new issues. Working out at [employer]  Pt accompanied by: self  PERTINENT HISTORY: HTN, Right frontal meningoma resection 05/25/23 with LLE weakness; reports remote R ankle injury   PAIN:  Are you having pain? No  PRECAUTIONS: Fall  RED FLAGS: None   WEIGHT BEARING RESTRICTIONS: No  FALLS: Has patient fallen in last 6 months? 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   PATIENT GOALS: work on balance   OBJECTIVE:   TODAY'S TREATMENT: 02/02/24 Activity Comments  NMES to left tib anterior Good tetanic contraction achieved, able to increase recruitment  HEP review Excellent recall  Sidestepping x 2 min Red loop around feet for hip abd endurance/recruitment  Goblet squats 10# 2x5 reps   Suitcase carry march   Pop squats 1x10   Education/discussion regarding exercise sequence Written protocol for performing power movements 1st, strength 2nd, endurance-based last    PATIENT EDUCATION: Education details: edu on exam finding, prognosis, POC, HEP Person educated: Patient Education method: Explanation, Demonstration, Tactile cues, Verbal cues, and Handouts Education comprehension: verbalized understanding and returned demonstration  HOME EXERCISE PROGRAM: Access Code: [email or web address] URL: [email or web address] Date: 01/26/2024 Prepared by: [facility] - Outpatient  Rehab - [facility]  Exercises - Seated Ankle Dorsiflexion with Anchored Resistance  - 1 x daily - 5 x weekly - 2 sets - 10 reps - Single Leg Heel Raise with Counter Support  - 1 x daily - 5 x weekly - 2 sets - 10 reps - Sidelying Hip Abduction  - 1 x daily - 5 x weekly - 2 sets -  10 reps - Single Leg Bridge  - 1 x daily - 5 x weekly - 2 sets - 10 reps Note: Objective measures were completed at Evaluation unless otherwise noted.  DIAGNOSTIC FINDINGS: none recent   COGNITION: Overall cognitive status: Within functional limits for tasks assessed   SENSATION: Pt reports occasional N/T in the L LE at night, otherwise denies   COORDINATION: Alternating pronation/supination: WNL Alternating toe tap: difficult coordinate/recruit dorsiflexors on L  Finger to nose: WNL    MUSCLE TONE: possibly very slight increase in L extensor tone   POSTURE: No Significant postural limitations  LOWER EXTREMITY ROM:     Active  Right Eval Left Eval  Hip flexion    Hip extension    Hip abduction    Hip adduction    Hip internal rotation    Hip external rotation    Knee flexion    Knee extension    Ankle dorsiflexion 12 8  Ankle plantarflexion    Ankle inversion    Ankle eversion     (Blank rows = not tested)  LOWER EXTREMITY MMT:    MMT (tested in standard positioning) Right Eval Left Eval  Hip flexion 5 5  Hip extension 4 4-  Hip abduction 4 4+  Hip adduction 4+ 4  Hip internal rotation    Hip external rotation    Knee flexion 5 5  Knee extension 5 5  Ankle dorsiflexion 5 4  Ankle plantarflexion 10 reps  7 reps   Ankle inversion 4+ 4-  Ankle eversion 4 3+  (Blank rows = not tested)  GAIT: Gait pattern: Occasional slight R intoeing , L steppage, R hip drop when L LE in stance  Assistive device utilized: None Level of assistance: Modified independence   FUNCTIONAL TESTS:  TREATMENT DATE: 01/26/24      GOALS: Goals reviewed with patient? Yes  SHORT TERM GOALS: Target date: 02/16/2024  Patient to be independent with initial HEP. Baseline: HEP initiated Goal status: INITIAL    LONG TERM GOALS: Target date:  03/08/2024  Patient to be independent with advanced HEP. Baseline: Not yet initiated  Goal status: INITIAL  Patient to demonstrate B LE strength >/=4/5.  Baseline: See above Goal status: INITIAL  Patient to demonstrate 50% improvement in L hip stability when ambulating.  Baseline: Occasional slight R intoeing , L steppage, R hip drop when L LE stabilizing  Goal status: INITIAL  Patient to report 50% improvement in stability when performing head and body turns.  Baseline: reports sense of imbalance with these activities  Goal status: INITIAL   ASSESSMENT:  CLINICAL IMPRESSION: Initiated session with use of NMES to left tib anterior to improve recruitment and improve endurance to dorsiflexion for improved foot clearance during gait.  Pt would benefit from home use of NMES for this purpose and will reach out to referring provider for relevant prescription.  Continued with program development instructing in movements for improved LE power/recruitment with good tolerance and teach-back followed by strength training for improved mobility and balance for managing heavy resistance and position change to improve safety with household tasks.  Pt would benefit from ongoing sessions to progress POC details to improve mobility and activity tolerance for return to work when able.   OBJECTIVE IMPAIRMENTS: Abnormal gait, decreased balance, decreased coordination, difficulty walking, decreased ROM, and decreased strength.   ACTIVITY LIMITATIONS: carrying, lifting, sitting, standing, squatting, dressing, and locomotion level  PARTICIPATION LIMITATIONS: meal prep, cleaning, laundry, shopping, community activity, occupation, and church  PERSONAL FACTORS: Age, Past/current experiences, Time since onset of injury/illness/exacerbation, and 1 comorbidity: HTN  are also affecting patient's functional outcome.   REHAB POTENTIAL: Good  CLINICAL DECISION MAKING: Evolving/moderate complexity  EVALUATION  COMPLEXITY: Moderate  PLAN:  PT FREQUENCY: 1x/week  PT DURATION: 6 weeks  PLANNED INTERVENTIONS: 97164- PT Re-evaluation, 97110-Therapeutic exercises, 97530- Therapeutic activity, [id number]- Neuromuscular re-education, 97535- Self Care, [address]- Manual therapy, [id number]- Gait training, [phone]- Canalith repositioning, U9811- Electrical stimulation (unattended), [phone]- Electrical stimulation (manual), Patient/Family education, Balance training, Stair training, Taping, Vestibular training, Cryotherapy, and Moist heat  PLAN FOR NEXT SESSION: review HEP and progress for hip and ankle strengthening; high level balance activities including compliant surface, EC, head and body turns  4:27 PM, 02/02/24 M. Shary Decamp, PT, DPT Physical Therapist- [place] Office Number: [phone]

## 2024-02-02 NOTE — Telephone Encounter (Signed)
 Created in error

## 2024-02-03 ENCOUNTER — Encounter: Payer: Self-pay | Admitting: Nurse Practitioner

## 2024-02-03 ENCOUNTER — Telehealth: Payer: Self-pay

## 2024-02-03 NOTE — Telephone Encounter (Signed)
 Good Day Mr. Lucilla Lame, FNP  The attached patient is being seen in our Physical Therapy clinic for ongoing care from her history of meningioma resection and has ongoing LLE weakness and gait dysfunction related to her left foot drop (which is improving very well).  We have initiated use of Neuromuscular Electrical Stimulation (NMES) to good effect for her left ankle dorsiflexors.  If you are comfortable with doing so, would you provide a prescription to this patient so that we may furnish a home unit for ongoing use of NMES to improve left ankle dorsiflexion strength/endurance to improve safety with transfers and gait?    If you agree, please submit request in EPIC under MD Order, Other Orders (list NMES unit in comments) and please include the ICD-10 code (left foot drop), MD signature, and NPI. You may also fax to [facility] at [phone].   Thank you, 8:19 AM, 02/03/24 M. Shary Decamp, PT, DPT Physical Therapist- [place] Office Number: [phone]     [facility] [address] [place], [place]  [address] Phone:  [phone] Fax:  [phone]

## 2024-02-07 NOTE — Therapy (Signed)
 OUTPATIENT PHYSICAL THERAPY NEURO TREATMENT    Patient Name: Alisha Thomas MRN: [id number] DOB:11-28-1971, 53 y.o., female Today's Date: 02/09/2024   PCP: Jordan Hawks, PA-C  REFERRING PROVIDER: Delrae Alfred, NP   END OF SESSION:  PT End of Session - 02/09/24 1612     Visit Number 3    Number of Visits 7    Date for PT Re-Evaluation 03/08/24    Authorization Type UHC Medicaid-auth not required    Authorization - Number of Visits 27    PT Start Time 1531    PT Stop Time 1613    PT Time Calculation (min) 42 min    Activity Tolerance Patient tolerated treatment well    Behavior During Therapy WFL for tasks assessed/performed              Past Medical History:  Diagnosis Date   Hypertension    Ovarian cyst    Pelvic adhesions    Stomach ulcer    Past Surgical History:  Procedure Laterality Date   BRAIN TUMOR EXCISION  2024   BREAST CYST ASPIRATION Left 2021   ECTOPIC PREGNANCY SURGERY     OVARIAN CYST SURGERY     PELVIC LAPAROSCOPY  11/30/2006   Diag Lap lysis of adhesions   Patient Active Problem List   Diagnosis Date Noted   Symptomatic anemia 03/26/2020   Iron deficiency anemia due to chronic blood loss 02/24/2018   Fibroids 07/04/2015   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   Peptic ulcer 09/10/2011   Ovarian cyst    Pelvic adhesions     ONSET DATE: Right frontal meningoma resection 05/25/23   REFERRING DIAG: D32.9 (ICD-10-CM) - Benign neoplasm of meninges, unspecified R26.89 (ICD-10-CM) - Imbalance  THERAPY DIAG:  Unsteadiness on feet  Muscle weakness (generalized)  Other abnormalities of gait and mobility  Difficulty in walking, not elsewhere classified  Rationale for Evaluation and Treatment: Rehabilitation  SUBJECTIVE:                                                                                                                                                                                             SUBJECTIVE STATEMENT: Nothing  new.   Pt accompanied by: self  PERTINENT HISTORY: HTN, Right frontal meningoma resection 05/25/23 with LLE weakness; reports remote R ankle injury   PAIN:  Are you having pain? No  PRECAUTIONS: Fall  RED FLAGS: None   WEIGHT BEARING RESTRICTIONS: No  FALLS: Has patient fallen in last 6 months? 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   PATIENT GOALS: work on balance   OBJECTIVE:     TODAY'S TREATMENT: 02/09/24 Activity Comments  sidelying L ankle inversion/eversion  Without weight, then with 1.5 lbs cueing for slower eccentric lower  single leg heel raise required B con, L ecc when trying to perform without UE support; able to perform well on single leg with 1 UE support   fwd/back stepping with 1 foot on foam Mild instability   romberg foam EC 2x30" Mild sway   romberg on foam + head turns/nods 2x30" each  Mild-mod sway with head nods   gait + head turns/nods/diagonals  Most instability with diagonals; required SBA for safety       HOME EXERCISE PROGRAM: Access Code: [email or web address] URL: [email or web address] Date: 02/09/2024 Prepared by: [facility] - Outpatient  Rehab - [facility]  Exercises - Seated Ankle Dorsiflexion with Anchored Resistance  - 1 x daily - 5 x weekly - 2 sets - 10 reps - Single Leg Heel Raise with Counter Support  - 1 x daily - 5 x weekly - 2 sets - 10 reps - Sidelying Hip Abduction  - 1 x daily - 5 x weekly - 2 sets - 10 reps - Single Leg Bridge  - 1 x daily - 5 x weekly - 2 sets - 10 reps - Side Stepping with Resistance at Feet  - 1 x daily - 7 x weekly - 1-3 sets - 2 min rounds hold - Goblet Squat  with Kettlebell  - 1 x daily - 7 x weekly - 3 sets - 10 reps - Sidelying Ankle Inversion with Ankle Weight  - 1 x daily - 5 x weekly - 2 sets - 10 reps - Sidelying Ankle Eversion Strengthening with Ankle Weight  - 1 x daily - 5 x weekly - 2 sets - 10 reps     PATIENT EDUCATION: Education details: HEP update, edu on dividing HEP in half and alternating A/B days  Person educated: Patient Education method: Explanation, Demonstration, Tactile cues, Verbal cues, and Handouts Education comprehension: verbalized understanding and returned demonstration    Note: Objective measures were completed at Evaluation unless otherwise noted.  DIAGNOSTIC FINDINGS: none recent   COGNITION: Overall cognitive status: Within functional limits for tasks assessed   SENSATION: Pt reports occasional N/T in the L LE at night, otherwise denies   COORDINATION: Alternating pronation/supination: WNL Alternating toe tap: difficult coordinate/recruit dorsiflexors on L  Finger to nose: WNL    MUSCLE TONE: possibly very slight increase in L extensor tone   POSTURE: No Significant postural limitations  LOWER EXTREMITY ROM:     Active  Right Eval Left Eval  Hip flexion    Hip extension    Hip abduction    Hip adduction    Hip internal rotation    Hip external rotation    Knee flexion    Knee extension    Ankle dorsiflexion 12 8  Ankle plantarflexion    Ankle inversion    Ankle eversion     (Blank rows = not tested)  LOWER EXTREMITY MMT:    MMT (tested in standard positioning) Right Eval Left Eval  Hip flexion 5 5  Hip extension 4 4-  Hip abduction 4 4+  Hip adduction 4+ 4  Hip internal rotation    Hip external rotation    Knee flexion 5 5  Knee extension 5 5  Ankle dorsiflexion 5 4  Ankle plantarflexion 10 reps  7 reps   Ankle inversion 4+ 4-  Ankle eversion 4 3+  (Blank rows = not tested)  GAIT: Gait pattern: Occasional slight R intoeing , L steppage, R hip drop when L LE in  stance  Assistive device utilized: None Level of assistance: Modified independence   FUNCTIONAL TESTS:                                                                                                                                  TREATMENT DATE: 01/26/24      GOALS: Goals reviewed with patient? Yes  SHORT TERM GOALS: Target date: 02/16/2024  Patient to be independent with initial HEP. Baseline: HEP initiated Goal status: IN PROGRESS    LONG TERM GOALS: Target date: 03/08/2024  Patient to be independent with advanced HEP. Baseline: Not yet initiated  Goal status: IN PROGRESS  Patient to demonstrate B LE strength >/=4/5.  Baseline: See above Goal status: IN PROGRESS  Patient to demonstrate 50% improvement in L hip stability when ambulating.  Baseline: Occasional slight R intoeing , L steppage, R hip drop when L LE stabilizing  Goal status: IN PROGRESS  Patient to report 50% improvement in stability when performing head and body turns.  Baseline: reports sense of imbalance with these activities  Goal status: IN PROGRESS   ASSESSMENT:  CLINICAL IMPRESSION: Patient arrived to session without complaints. Worked on L ankle strengthening into rotation and plantarflexion. Provided verbal cues and modifications to ensure understanding of these exercises and reasoning behind each one. Multisensory balance activities revealed good stability with EC; more challenge with head nods and diagonals. Patient reported understanding of HEP and without complaints upon leaving.    OBJECTIVE IMPAIRMENTS: Abnormal gait, decreased balance, decreased coordination, difficulty walking, decreased ROM, and decreased strength.   ACTIVITY LIMITATIONS: carrying, lifting, sitting, standing, squatting, dressing, and locomotion level  PARTICIPATION LIMITATIONS: meal prep, cleaning, laundry, shopping, community activity, occupation, and church  PERSONAL FACTORS: Age, Past/current experiences, Time  since onset of injury/illness/exacerbation, and 1 comorbidity: HTN  are also affecting patient's functional outcome.   REHAB POTENTIAL: Good  CLINICAL DECISION MAKING: Evolving/moderate complexity  EVALUATION COMPLEXITY: Moderate  PLAN:  PT FREQUENCY: 1x/week  PT DURATION: 6 weeks  PLANNED INTERVENTIONS: 97164- PT Re-evaluation, 97110-Therapeutic exercises, 97530- Therapeutic activity, [id number]- Neuromuscular re-education, 97535- Self Care, [address]- Manual therapy, [id number]- Gait training, [phone]- Canalith repositioning, U9811- Electrical stimulation (unattended), [phone]- Electrical stimulation (manual), Patient/Family education, Balance training, Stair training, Taping, Vestibular training, Cryotherapy, and Moist heat  PLAN FOR NEXT SESSION: review HEP and progress for hip and ankle strengthening; high level balance activities including compliant surface, EC, head and body turns    Baldemar Friday, [place], DPT 02/09/24 4:16 PM  [place]  [facility] at [facility] [address] [place], [place] [address] Phone # [phone] Fax # [phone]

## 2024-02-09 ENCOUNTER — Ambulatory Visit: Payer: Medicaid Other | Admitting: Physical Therapy

## 2024-02-09 ENCOUNTER — Encounter: Payer: Self-pay | Admitting: Physical Therapy

## 2024-02-09 ENCOUNTER — Encounter: Payer: Self-pay | Admitting: Nurse Practitioner

## 2024-02-09 ENCOUNTER — Ambulatory Visit (INDEPENDENT_AMBULATORY_CARE_PROVIDER_SITE_OTHER): Payer: Medicaid Other | Admitting: Nurse Practitioner

## 2024-02-09 VITALS — BP 112/82 | HR 80 | Ht 64.75 in | Wt 144.0 lb

## 2024-02-09 DIAGNOSIS — R2681 Unsteadiness on feet: Secondary | ICD-10-CM | POA: Diagnosis not present

## 2024-02-09 DIAGNOSIS — Z01419 Encounter for gynecological examination (general) (routine) without abnormal findings: Secondary | ICD-10-CM

## 2024-02-09 DIAGNOSIS — M6281 Muscle weakness (generalized): Secondary | ICD-10-CM

## 2024-02-09 DIAGNOSIS — R2689 Other abnormalities of gait and mobility: Secondary | ICD-10-CM

## 2024-02-09 DIAGNOSIS — N951 Menopausal and female climacteric states: Secondary | ICD-10-CM | POA: Diagnosis not present

## 2024-02-09 DIAGNOSIS — Z1331 Encounter for screening for depression: Secondary | ICD-10-CM | POA: Diagnosis not present

## 2024-02-09 DIAGNOSIS — R262 Difficulty in walking, not elsewhere classified: Secondary | ICD-10-CM

## 2024-02-09 NOTE — Progress Notes (Signed)
 Alisha Thomas 1971-08-01 [id number]   History:  53 y.o. [id number] presents for annual exam. Menses every 2-3 months. Denies menopausal symptoms. Negative EMB 10/2021. Normal pap history. HTN managed by PCP. June 2024 removal of brain meningioma, doing well. Still doing outpatient PT.   Gynecologic History Patient's last menstrual period was 12/10/2023 (approximate).   Contraception/Family planning: none Sexually active: Yes  Health Maintenance Last Pap: 12/25/2019. Results were: Normal neg HPV Last mammogram: 10/06/2023. Results were: Normal Last colonoscopy: 12/17/2010 Last Dexa: Not indicated     02/09/2024    2:35 PM  Depression screen PHQ 2/9  Decreased Interest 0  Down, Depressed, Hopeless 0  PHQ - 2 Score 0     Past medical history, past surgical history, family history and social history were all reviewed and documented in the EPIC chart. Married. Not working d/t brain surgery. Husband travels to [place] for work. 12 yo daughter working on Education administrator in Management consultant at [employer]. Son in 10th grade.   ROS:  A ROS was performed and pertinent positives and negatives are included.  Exam:  Vitals:   02/09/24 1435  BP: 112/82  Pulse: 80  SpO2: 99%  Weight: 144 lb (65.3 kg)  Height: 5' 4.75" (1.645 m)   Body mass index is 24.15 kg/m.   General appearance:  Normal Thyroid:  Symmetrical, normal in size, without palpable masses or nodularity. Respiratory  Auscultation:  Clear without wheezing or rhonchi Cardiovascular  Auscultation:  Regular rate, without rubs, murmurs or gallops  Edema/varicosities:  Not grossly evident Abdominal  Soft,nontender, without masses, guarding or rebound.  Liver/spleen:  No organomegaly noted  Hernia:  None appreciated  Skin  Inspection:  Grossly normal Breasts: Examined lying and sitting.   Right: Without masses, retractions, nipple discharge or axillary adenopathy.   Left: Without masses, retractions, nipple discharge or axillary  adenopathy. Pelvic: External genitalia:  no lesions, circumcised               Urethra:  normal appearing urethra with no masses, tenderness or lesions              Bartholins and Skenes: normal                 Vagina: normal appearing vagina with normal color and discharge, no lesions              Cervix: no lesions Bimanual Exam:  Uterus:  no masses or tenderness              Adnexa: no mass, fullness, tenderness              Rectovaginal: Deferred              Anus:  normal, no lesions  Patient informed chaperone available to be present for breast and pelvic exam. Patient has requested no chaperone to be present. Patient has been advised what will be completed during breast and pelvic exam.   Assessment/Plan:  53 y.o. [id number] for annual exam.   Well female exam with routine gynecological exam - Education provided on SBEs, importance of preventative screenings, current guidelines, high calcium diet, regular exercise, and multivitamin daily. Labs with PCP.   Perimenopause - irregular periods, no menopausal symptoms. Discussed what to expect.   Screening for cervical cancer - Normal Pap history.  Will repeat at 55-year interval per guidelines.  Screening for breast cancer - Normal mammogram history.  Continue annual screenings.  Normal breast exam today.  Screening for colon  cancer - 2012 colonoscopy. Recommend repeating.   Screening for osteoporosis - Average risk. Will plan DXA at age 53.   Return in about 1 year (around 02/08/2025) for Annual.     Olivia Mackie DNP, 2:57 PM 02/09/2024

## 2024-02-14 ENCOUNTER — Encounter: Payer: Self-pay | Admitting: Internal Medicine

## 2024-02-15 NOTE — Therapy (Signed)
 OUTPATIENT PHYSICAL THERAPY NEURO TREATMENT    Patient Name: Alisha Thomas MRN: [id number] DOB:09-Dec-1970, 53 y.o., female Today's Date: 02/16/2024   PCP: Jordan Hawks, PA-C  REFERRING PROVIDER: Delrae Alfred, NP   END OF SESSION:  PT End of Session - 02/16/24 1620     Visit Number 4    Number of Visits 7    Date for PT Re-Evaluation 03/08/24    Authorization Type [employer] Medicaid-auth not required    Authorization - Number of Visits 27    PT Start Time 1526    PT Stop Time 1619    PT Time Calculation (min) 53 min    Activity Tolerance Patient tolerated treatment well    Behavior During Therapy WFL for tasks assessed/performed               Past Medical History:  Diagnosis Date   Hypertension    Ovarian cyst    Pelvic adhesions    Stomach ulcer    Past Surgical History:  Procedure Laterality Date   BRAIN TUMOR EXCISION  2024   BREAST CYST ASPIRATION Left 2021   ECTOPIC PREGNANCY SURGERY     OVARIAN CYST SURGERY     PELVIC LAPAROSCOPY  11/30/2006   Diag Lap lysis of adhesions   Patient Active Problem List   Diagnosis Date Noted   Symptomatic anemia 03/26/2020   Iron deficiency anemia due to chronic blood loss 02/24/2018   Fibroids 07/04/2015   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   Peptic ulcer 09/10/2011   Ovarian cyst    Pelvic adhesions     ONSET DATE: Right frontal meningoma resection 05/25/23   REFERRING DIAG: D32.9 (ICD-10-CM) - Benign neoplasm of meninges, unspecified R26.89 (ICD-10-CM) - Imbalance  THERAPY DIAG:  Unsteadiness on feet  Muscle weakness (generalized)  Other abnormalities of gait and mobility  Difficulty in walking, not elsewhere classified  Rationale for Evaluation and Treatment: Rehabilitation  SUBJECTIVE:                                                                                                                                                                                             SUBJECTIVE STATEMENT: Pt  reports that she spoke to her MD who said that they did not receive note from PT requesting order for NMES unit. Requesting a hard copy of this note to send to her doctor. Reports that the ankle exercises are hard but helpful.   Pt accompanied by: self  PERTINENT HISTORY: HTN, Right frontal meningoma resection 05/25/23 with LLE weakness; reports remote R ankle injury   PAIN:  Are you having pain? No  PRECAUTIONS: Fall  RED FLAGS: None   WEIGHT BEARING RESTRICTIONS: No  FALLS: Has patient fallen in last 6 months? 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   PATIENT GOALS: work on balance   OBJECTIVE:      TODAY'S TREATMENT: 02/16/24 Activity Comments  standing hip ABD/ADD with TB around foot for ankle stabilization 2x10  Using B therapy poles for stability; reports more challenge into eversion   walking on heels/toes  Decreased amplitude on L; required UE support with walking on heels   L foot fwd hops B foot fwd hops B foot hop, landing on L foot Hopscotch  1 UE support on counter required; cueing to use arm swing and land equally on B feet   3x squat jump + banded sidesteppiung with red TB 3 rounds  Cues for soft landing   Deadlift with kettle bell then hop 2x30" Demo and verbal cueing required for bent knees, proper arm swing      HOME EXERCISE PROGRAM Last updated: 02/16/24  Access Code: [email or web address] URL: [email or web address] Date: 02/16/2024 Prepared by: [facility] - Outpatient  Rehab - [facility]  Exercises - Seated Ankle Dorsiflexion with Anchored Resistance  - 1 x daily - 5 x weekly - 2 sets - 10 reps - Single Leg Heel  Raise with Counter Support  - 1 x daily - 5 x weekly - 2 sets - 10 reps - Sidelying Hip Abduction  - 1 x daily - 5 x weekly - 2 sets - 10 reps - Single Leg Bridge  - 1 x daily - 5 x weekly - 2 sets - 10 reps - Side Stepping with Resistance at Feet  - 1 x daily - 7 x weekly - 1-3 sets - 2 min rounds hold - Goblet Squat with Kettlebell  - 1 x daily - 7 x weekly - 3 sets - 10 reps - Sidelying Ankle Inversion with Ankle Weight  - 1 x daily - 5 x weekly - 2 sets - 10 reps - Sidelying Ankle Eversion Strengthening with Ankle Weight  - 1 x daily - 5 x weekly - 2 sets - 10 reps - Hopscotch  - 1 x daily - 5 x weekly - 2 sets - 10 reps   PATIENT EDUCATION: Education details: printed out copy of 02/03/24 telephone encounter requesting NMES unit, HEP update Person educated: Patient Education method: Explanation and Demonstration Education comprehension: verbalized understanding    Note: Objective measures were completed at Evaluation unless otherwise noted.  DIAGNOSTIC FINDINGS: none recent   COGNITION: Overall cognitive status: Within functional limits for tasks assessed   SENSATION: Pt reports occasional N/T in the L LE at night, otherwise denies   COORDINATION: Alternating pronation/supination: WNL Alternating toe tap: difficult coordinate/recruit dorsiflexors on L  Finger to nose: WNL    MUSCLE TONE: possibly very slight increase in L extensor tone   POSTURE: No Significant postural limitations  LOWER EXTREMITY ROM:     Active  Right Eval Left Eval  Hip flexion    Hip extension    Hip abduction    Hip adduction    Hip internal rotation    Hip external  rotation    Knee flexion    Knee extension    Ankle dorsiflexion 12 8  Ankle plantarflexion    Ankle inversion    Ankle eversion     (Blank rows = not tested)  LOWER EXTREMITY MMT:    MMT (tested in standard positioning) Right Eval Left Eval  Hip flexion 5 5  Hip extension 4 4-  Hip abduction 4 4+  Hip adduction  4+ 4  Hip internal rotation    Hip external rotation    Knee flexion 5 5  Knee extension 5 5  Ankle dorsiflexion 5 4  Ankle plantarflexion 10 reps  7 reps   Ankle inversion 4+ 4-  Ankle eversion 4 3+  (Blank rows = not tested)  GAIT: Gait pattern: Occasional slight R intoeing , L steppage, R hip drop when L LE in stance  Assistive device utilized: None Level of assistance: Modified independence   FUNCTIONAL TESTS:                                                                                                                                  TREATMENT DATE: 01/26/24      GOALS: Goals reviewed with patient? Yes  SHORT TERM GOALS: Target date: 02/16/2024  Patient to be independent with initial HEP. Baseline: HEP initiated Goal status: IN PROGRESS    LONG TERM GOALS: Target date: 03/08/2024  Patient to be independent with advanced HEP. Baseline: Not yet initiated  Goal status: IN PROGRESS  Patient to demonstrate B LE strength >/=4/5.  Baseline: See above Goal status: IN PROGRESS  Patient to demonstrate 50% improvement in L hip stability when ambulating.  Baseline: Occasional slight R intoeing , L steppage, R hip drop when L LE stabilizing  Goal status: IN PROGRESS  Patient to report 50% improvement in stability when performing head and body turns.  Baseline: reports sense of imbalance with these activities  Goal status: IN PROGRESS   ASSESSMENT:  CLINICAL IMPRESSION: Patient arrived to session with report of good benefit from ankle strengthening provided last session. Continued working on hip and ankle stabilization activities with good form. Proceeded working on plyometrics to encourage increased participation, control, and stability on the L LE. SBA provided with these activities d/t occasional imbalance. Patient is tolerating sessions well and with out complaints at end of session.    OBJECTIVE IMPAIRMENTS: Abnormal gait, decreased balance, decreased  coordination, difficulty walking, decreased ROM, and decreased strength.   ACTIVITY LIMITATIONS: carrying, lifting, sitting, standing, squatting, dressing, and locomotion level  PARTICIPATION LIMITATIONS: meal prep, cleaning, laundry, shopping, community activity, occupation, and church  PERSONAL FACTORS: Age, Past/current experiences, Time since onset of injury/illness/exacerbation, and 1 comorbidity: HTN  are also affecting patient's functional outcome.   REHAB POTENTIAL: Good  CLINICAL DECISION MAKING: Evolving/moderate complexity  EVALUATION COMPLEXITY: Moderate  PLAN:  PT FREQUENCY: 1x/week  PT DURATION: 6 weeks  PLANNED INTERVENTIONS: 97164- PT Re-evaluation, 97110-Therapeutic exercises, 97530-  Therapeutic activity, [id number]- Neuromuscular re-education, [phone]- Self Care, 84696- Manual therapy, [phone]- Gait training, [phone]- Canalith repositioning, [phone]- Electrical stimulation (unattended), [phone]- Electrical stimulation (manual), Patient/Family education, Balance training, Stair training, Taping, Vestibular training, Cryotherapy, and Moist heat  PLAN FOR NEXT SESSION: possible DC; progress for hip and ankle strengthening; high level balance activities including compliant surface, EC, head and body turns    Baldemar Friday, [place], DPT 02/16/24 4:22 PM  [facility] Re[facility] at [facility] [address], [address] [place], [place] [address] Phone # [phone] Fax # [phone]

## 2024-02-16 ENCOUNTER — Ambulatory Visit: Payer: Medicaid Other | Admitting: Physical Therapy

## 2024-02-16 ENCOUNTER — Encounter: Payer: Self-pay | Admitting: Physical Therapy

## 2024-02-16 DIAGNOSIS — R2681 Unsteadiness on feet: Secondary | ICD-10-CM | POA: Diagnosis not present

## 2024-02-16 DIAGNOSIS — R2689 Other abnormalities of gait and mobility: Secondary | ICD-10-CM

## 2024-02-16 DIAGNOSIS — M6281 Muscle weakness (generalized): Secondary | ICD-10-CM

## 2024-02-16 DIAGNOSIS — R262 Difficulty in walking, not elsewhere classified: Secondary | ICD-10-CM

## 2024-02-22 NOTE — Therapy (Signed)
 OUTPATIENT PHYSICAL THERAPY NEURO TREATMENT    Patient Name: Alisha Thomas MRN: [id number] DOB:September 01, 1971, 54 y.o., female Today's Date: 02/22/2024   PCP: Jordan Hawks, PA-C  REFERRING PROVIDER: Delrae Alfred, NP   END OF SESSION:      Past Medical History:  Diagnosis Date   Hypertension    Ovarian cyst    Pelvic adhesions    Stomach ulcer    Past Surgical History:  Procedure Laterality Date   BRAIN TUMOR EXCISION  2024   BREAST CYST ASPIRATION Left 2021   ECTOPIC PREGNANCY SURGERY     OVARIAN CYST SURGERY     PELVIC LAPAROSCOPY  11/30/2006   Diag Lap lysis of adhesions   Patient Active Problem List   Diagnosis Date Noted   Symptomatic anemia 03/26/2020   Iron deficiency anemia due to chronic blood loss 02/24/2018   Fibroids 07/04/2015   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  Rationale for Evaluation and Treatment: Rehabilitation  SUBJECTIVE:                                                                                                                                                                                             SUBJECTIVE STATEMENT: Pt reports that she spoke to her MD who said that they did not receive note from PT requesting order for NMES unit. Requesting a hard copy of this note to send to her doctor. Reports that the ankle exercises are hard but helpful.   Pt accompanied by: self  PERTINENT HISTORY: HTN, Right frontal meningoma resection 05/25/23 with LLE weakness; reports remote R ankle injury   PAIN:  Are you having pain? No  PRECAUTIONS: Fall  RED FLAGS: None   WEIGHT BEARING RESTRICTIONS: No  FALLS: Has patient fallen in last 6 months? 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   PATIENT GOALS: work on balance   OBJECTIVE:     TODAY'S TREATMENT: 02/23/24 Activity Comments  TODAY'S TREATMENT: 02/16/24 Activity Comments  standing hip ABD/ADD with TB around foot for ankle stabilization 2x10  Using B therapy poles for stability; reports more challenge into eversion   walking on heels/toes  Decreased amplitude on L; required UE support with walking on heels   L foot fwd hops B foot fwd hops B foot hop, landing on L foot Hopscotch  1 UE support on counter required; cueing to use arm swing and land equally on B feet   3x squat jump + banded sidesteppiung with red TB 3 rounds  Cues for soft landing   Deadlift with kettle bell then hop 2x30" Demo and verbal cueing required for bent knees, proper arm swing      HOME EXERCISE PROGRAM Last updated: 02/16/24  Access Code: [email or web address] URL: [email or web address] Date: 02/16/2024 Prepared by: [facility] - Outpatient  Rehab - [facility]  Exercises - Seated Ankle Dorsiflexion with Anchored Resistance  - 1 x daily - 5 x weekly - 2 sets - 10 reps - Single Leg Heel Raise with Counter Support  - 1 x daily - 5 x weekly - 2 sets - 10 reps - Sidelying Hip Abduction  - 1 x daily - 5 x weekly - 2 sets - 10 reps - Single Leg Bridge  - 1 x daily - 5 x weekly - 2 sets - 10 reps - Side Stepping with Resistance at Feet  - 1 x daily - 7 x weekly - 1-3 sets - 2 min rounds hold - Goblet Squat with Kettlebell  - 1 x daily - 7 x weekly - 3 sets - 10 reps - Sidelying Ankle Inversion with Ankle Weight  - 1 x daily - 5 x weekly - 2 sets -  10 reps - Sidelying Ankle Eversion Strengthening with Ankle Weight  - 1 x daily - 5 x weekly - 2 sets - 10 reps - Hopscotch  - 1 x daily - 5 x weekly - 2 sets - 10 reps   PATIENT EDUCATION: Education details: printed out copy of 02/03/24 telephone encounter requesting NMES unit, HEP update Person educated: Patient Education method: Explanation and Demonstration Education comprehension: verbalized understanding    Note: Objective measures were completed at Evaluation unless otherwise noted.  DIAGNOSTIC FINDINGS: none recent   COGNITION: Overall cognitive status: Within functional limits for tasks assessed   SENSATION: Pt reports occasional N/T in the L LE at night, otherwise denies   COORDINATION: Alternating pronation/supination: WNL Alternating toe tap: difficult coordinate/recruit dorsiflexors on L  Finger to nose: WNL    MUSCLE TONE: possibly very slight increase in L extensor tone   POSTURE: No Significant postural limitations  LOWER EXTREMITY ROM:     Active  Right Eval Left Eval  Hip flexion    Hip extension    Hip abduction    Hip adduction    Hip internal rotation    Hip external rotation    Knee flexion    Knee extension    Ankle dorsiflexion 12 8  Ankle plantarflexion    Ankle inversion    Ankle eversion     (Blank rows = not tested)  LOWER EXTREMITY MMT:    MMT (tested in standard positioning) Right Eval Left Eval  Hip flexion 5 5  Hip extension 4 4-  Hip abduction 4 4+  Hip adduction 4+ 4  Hip internal rotation    Hip external rotation    Knee flexion 5 5  Knee extension 5 5  Ankle dorsiflexion  5 4  Ankle plantarflexion 10 reps  7 reps   Ankle inversion 4+ 4-  Ankle eversion 4 3+  (Blank rows = not tested)  GAIT: Gait pattern: Occasional slight R intoeing , L steppage, R hip drop when L LE in stance  Assistive device utilized: None Level of assistance: Modified independence   FUNCTIONAL TESTS:                                                                                                                                   TREATMENT DATE: 01/26/24      GOALS: Goals reviewed with patient? Yes  SHORT TERM GOALS: Target date: 02/16/2024  Patient to be independent with initial HEP. Baseline: HEP initiated Goal status: IN PROGRESS    LONG TERM GOALS: Target date: 03/08/2024  Patient to be independent with advanced HEP. Baseline: Not yet initiated  Goal status: IN PROGRESS  Patient to demonstrate B LE strength >/=4/5.  Baseline: See above Goal status: IN PROGRESS  Patient to demonstrate 50% improvement in L hip stability when ambulating.  Baseline: Occasional slight R intoeing , L steppage, R hip drop when L LE stabilizing  Goal status: IN PROGRESS  Patient to report 50% improvement in stability when performing head and body turns.  Baseline: reports sense of imbalance with these activities  Goal status: IN PROGRESS   ASSESSMENT:  CLINICAL IMPRESSION: Patient arrived to session with report of good benefit from ankle strengthening provided last session. Continued working on hip and ankle stabilization activities with good form. Proceeded working on plyometrics to encourage increased participation, control, and stability on the L LE. SBA provided with these activities d/t occasional imbalance. Patient is tolerating sessions well and with out complaints at end of session.    OBJECTIVE IMPAIRMENTS: Abnormal gait, decreased balance, decreased coordination, difficulty walking, decreased ROM, and decreased strength.   ACTIVITY LIMITATIONS: carrying, lifting, sitting, standing, squatting, dressing, and locomotion level  PARTICIPATION LIMITATIONS: meal prep, cleaning, laundry, shopping, community activity, occupation, and church  PERSONAL FACTORS: Age, Past/current experiences, Time since onset of injury/illness/exacerbation, and 1 comorbidity: HTN  are also affecting patient's functional outcome.   REHAB  POTENTIAL: Good  CLINICAL DECISION MAKING: Evolving/moderate complexity  EVALUATION COMPLEXITY: Moderate  PLAN:  PT FREQUENCY: 1x/week  PT DURATION: 6 weeks  PLANNED INTERVENTIONS: 97164- PT Re-evaluation, 97110-Therapeutic exercises, 97530- Therapeutic activity, 97112- Neuromuscular re-education, 97535- Self Care, 96045- Manual therapy, [phone]- Gait training, [phone]- Canalith repositioning, [id number]- Electrical stimulation (unattended), [phone]- Electrical stimulation (manual), Patient/Family education, Balance training, Stair training, Taping, Vestibular training, Cryotherapy, and Moist heat  PLAN FOR NEXT SESSION: possible DC; progress for hip and ankle strengthening; high level balance activities including compliant surface, EC, head and body turns    Baldemar Friday, [place], DPT 02/22/24 3:56 PM  [facility] Outpatient Rehab at [facility] [address] [place], [place] [address] Phone # [phone] Fax # [phone]

## 2024-02-23 ENCOUNTER — Encounter: Payer: Self-pay | Admitting: Physical Therapy

## 2024-02-23 ENCOUNTER — Ambulatory Visit: Payer: Medicaid Other | Admitting: Physical Therapy

## 2024-02-23 DIAGNOSIS — M6281 Muscle weakness (generalized): Secondary | ICD-10-CM

## 2024-02-23 DIAGNOSIS — R2681 Unsteadiness on feet: Secondary | ICD-10-CM | POA: Diagnosis not present

## 2024-02-23 DIAGNOSIS — R2689 Other abnormalities of gait and mobility: Secondary | ICD-10-CM

## 2024-02-23 DIAGNOSIS — R262 Difficulty in walking, not elsewhere classified: Secondary | ICD-10-CM

## 2024-02-23 NOTE — Patient Instructions (Signed)
 Alisha Thomas

## 2024-02-28 ENCOUNTER — Ambulatory Visit (AMBULATORY_SURGERY_CENTER)

## 2024-02-28 VITALS — Ht 64.75 in | Wt 140.0 lb

## 2024-02-28 DIAGNOSIS — Z1211 Encounter for screening for malignant neoplasm of colon: Secondary | ICD-10-CM

## 2024-02-28 MED ORDER — SUFLAVE 178.7 G PO SOLR
1.0000 | Freq: Once | ORAL | 0 refills | Status: AC
Start: 2024-02-28 — End: 2024-02-28

## 2024-02-28 NOTE — Progress Notes (Signed)

## 2024-03-13 ENCOUNTER — Encounter: Payer: Self-pay | Admitting: Nurse Practitioner

## 2024-03-13 NOTE — Telephone Encounter (Signed)
 Pt also LVM in triage line about this.   Will respond to her via mchart msg.

## 2024-03-23 ENCOUNTER — Encounter: Admitting: Internal Medicine

## 2024-04-20 LAB — COLOGUARD: COLOGUARD: NEGATIVE

## 2024-09-25 ENCOUNTER — Other Ambulatory Visit: Payer: Self-pay

## 2024-09-25 DIAGNOSIS — D329 Benign neoplasm of meninges, unspecified: Secondary | ICD-10-CM

## 2024-10-21 ENCOUNTER — Other Ambulatory Visit: Payer: Self-pay

## 2024-10-21 ENCOUNTER — Encounter (HOSPITAL_BASED_OUTPATIENT_CLINIC_OR_DEPARTMENT_OTHER): Payer: Self-pay | Admitting: Physical Therapy

## 2024-10-21 ENCOUNTER — Ambulatory Visit (HOSPITAL_BASED_OUTPATIENT_CLINIC_OR_DEPARTMENT_OTHER): Attending: Family Medicine | Admitting: Physical Therapy

## 2024-10-21 DIAGNOSIS — M25561 Pain in right knee: Secondary | ICD-10-CM | POA: Insufficient documentation

## 2024-10-21 DIAGNOSIS — R29898 Other symptoms and signs involving the musculoskeletal system: Secondary | ICD-10-CM | POA: Insufficient documentation

## 2024-10-21 DIAGNOSIS — M25562 Pain in left knee: Secondary | ICD-10-CM | POA: Diagnosis present

## 2024-10-21 DIAGNOSIS — M6281 Muscle weakness (generalized): Secondary | ICD-10-CM | POA: Insufficient documentation

## 2024-10-21 DIAGNOSIS — R2689 Other abnormalities of gait and mobility: Secondary | ICD-10-CM | POA: Insufficient documentation

## 2024-10-21 NOTE — Therapy (Signed)
 OUTPATIENT PHYSICAL THERAPY LOWER EXTREMITY EVALUATION   Patient Name: Alisha Thomas MRN: [id number] DOB:[birth year], 53 y.o., female Today's Date: 10/21/2024  END OF SESSION:  PT End of Session - 10/21/24 1059     Visit Number 1    Number of Visits 16    Date for Recertification  12/16/24    Authorization Type [place] MEDICAID [employer]    PT Start Time 1057    PT Stop Time 1130    PT Time Calculation (min) 33 min    Activity Tolerance Patient tolerated treatment well    Behavior During Therapy WFL for tasks assessed/performed          Past Medical History:  Diagnosis Date   Anemia    Hypertension    Ovarian cyst    Pelvic adhesions    Stomach ulcer    Past Surgical History:  Procedure Laterality Date   BRAIN TUMOR EXCISION  2024   BREAST CYST ASPIRATION Left 2021   ECTOPIC PREGNANCY SURGERY     OVARIAN CYST SURGERY     PELVIC LAPAROSCOPY  11/30/2006   Diag Lap lysis of adhesions   Patient Active Problem List   Diagnosis Date Noted   Symptomatic anemia 03/26/2020   Iron deficiency anemia due to chronic blood loss 02/24/2018   Fibroids 07/04/2015   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   Peptic ulcer 09/10/2011   Ovarian cyst    Pelvic adhesions     PCP: Vaughn Lauraine KATHEE DEVONNA  REFERRING PROVIDER: Irving Delinda ORN, MD  REFERRING DIAG: [phone] (ICD-10-CM) - Patellofemoral disorders, right knee M22.2X2 (ICD-10-CM) - Patellofemoral disorders, left knee  THERAPY DIAG:  Pain in both knees, unspecified chronicity  Muscle weakness (generalized)  Other abnormalities of gait and mobility  Other symptoms and signs involving the musculoskeletal system  Rationale for Evaluation and Treatment: Rehabilitation  ONSET DATE: chronic  SUBJECTIVE:   SUBJECTIVE STATEMENT: Patient states L sided weakness due brain surgery. Has been doing rehab to strengthen L side. Now limited by R knee pain.   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 No at rest  PRECAUTIONS: None  WEIGHT BEARING RESTRICTIONS: No  FALLS:  Has patient fallen in last 6 months? No  OCCUPATION: Substitute teaching  PLOF: Independent  PATIENT GOALS: strengthen legs, be strong and active   OBJECTIVE: (objective measures from initial evaluation unless otherwise dated)  PATIENT SURVEYS:  LEFS  Extreme difficulty/unable (0), Quite a bit of difficulty (1), Moderate difficulty (2), Little difficulty (3), No difficulty (4) Survey date:  10/21/24  Any of your usual work, housework or school activities 1  2. Usual hobbies, recreational or sporting activities 0  3. Getting into/out of the bath 4  4. Walking between rooms 4  5. Putting on socks/shoes 4  6. Squatting  0  7. Lifting an object, like a bag of groceries from the floor 1  8. Performing light activities around your home 4  9. Performing heavy activities around your home 0  10. Getting into/out of a car 4  11. Walking 2 blocks 4  12. Walking 1 mile 1  13. Going up/down 10 stairs (1 flight) 1  14. Standing for 1 hour 2  15.  sitting for 1 hour 4  16. Running on even ground 2  17. Running on uneven ground 1  18. Making sharp turns while running fast 0  19. Hopping  1  20. Rolling over in bed 4  Score total:  42/80     COGNITION: Overall cognitive status: Within functional limits for tasks assessed     SENSATION: WFL    PALPATION: Patellar mobs WFL, no tenderness grossly R knee  LOWER EXTREMITY ROM:WFL for tasks assessed  Active ROM Right eval Left eval  Hip flexion    Hip extension    Hip abduction    Hip adduction    Hip internal rotation    Hip external rotation    Knee flexion    Knee extension    Ankle dorsiflexion    Ankle plantarflexion    Ankle inversion    Ankle eversion     (Blank rows = not tested) *= pain/symptoms  LOWER EXTREMITY MMT:  MMT Right eval Left eval  Hip flexion 4+ 4+  Hip extension  4 4-  Hip abduction 4 4-  Hip adduction    Hip internal rotation    Hip external rotation    Knee flexion 5 4+*  Knee extension 4+* 4+  Ankle dorsiflexion    Ankle plantarflexion    Ankle inversion    Ankle eversion     (Blank rows = not tested) *= pain/symptoms   FUNCTIONAL TESTS:  5 times sit to stand: 14.16 seconds without UE support, mild valgus R>L which increases with reps Stairs: alternating pattern 7 inch, no UE support, trendelenberg bilaterally, valgus with descending R>L   GAIT: Distance walked: 100 feet Assistive device utilized: None Level of assistance: Complete Independence Comments: trendelenburg bilaterally   TODAY'S TREATMENT:                                                                                                                              DATE:  10/21/24 Eval and HEP    PATIENT EDUCATION:  Education details: Patient educated on exam findings, POC, scope of PT, HEP, relevant anatomy and biomechanics. Person educated: Patient Education method: Explanation, Demonstration, and Handouts Education comprehension: verbalized understanding, returned demonstration, verbal cues required, and tactile cues required  HOME EXERCISE PROGRAM: Access Code: [email or web address] URL: [email or web address] Date: 10/21/2024 Prepared by: Prentice Khyree Carillo  Exercises - Supine Bridge  - 1 x daily - 7 x weekly - 2 sets - 10 reps - Sidelying Hip Abduction  - 1 x daily - 7 x weekly - 2 sets - 10 reps - Seated Long Arc Quad  - 1 x daily - 7 x weekly - 10 reps - 5-10 second hold  ASSESSMENT:  CLINICAL IMPRESSION: Patient a 53 y.o. y.o. female who was seen today for physical therapy evaluation and treatment for R knee pain and bilateral LE strength deficits. Patient presents with pain limited deficits in bilateral knee strength, ROM, endurance, activity tolerance, gait, and functional mobility with ADL. Patient is having to modify and restrict ADL as indicated by  outcome measure score as well as subjective information and objective measures which is affecting overall participation. Patient will benefit from skilled physical therapy in order to  improve function and reduce impairment.  OBJECTIVE IMPAIRMENTS: Abnormal gait, decreased activity tolerance, decreased balance, decreased endurance, decreased mobility, difficulty walking, decreased ROM, decreased strength, increased muscle spasms, impaired flexibility, improper body mechanics, and pain  ACTIVITY LIMITATIONS: lifting, bending, standing, squatting, stairs, transfers, locomotion level, and caring for others  PARTICIPATION LIMITATIONS: meal prep, cleaning, laundry, shopping, community activity, occupation, and yard work  PERSONAL FACTORS: Time since onset of injury/illness/exacerbation and 1-2 comorbidities: HTN, Right frontal meningoma resection 05/25/23 with LLE weaknes are also affecting patient's functional outcome.   REHAB POTENTIAL: Good  CLINICAL DECISION MAKING: Evolving/moderate complexity  EVALUATION COMPLEXITY: Moderate   GOALS: Goals reviewed with patient? Yes  SHORT TERM GOALS: Target date: 11/18/2024    Patient will be independent with HEP in order to improve functional outcomes. Baseline: Goal status: INITIAL  2.  Patient will report at least 25% improvement in symptoms for improved quality of life. Baseline: Goal status: INITIAL    LONG TERM GOALS: Target date: 12/16/2024    Patient will report at least 75% improvement in symptoms for improved quality of life. Baseline:  Goal status: INITIAL  2.  Patient will improve LEFS score by at least 10 points in order to indicate improved tolerance to activity. Baseline:  Goal status: INITIAL  3.  Patient will be able to navigate stairs with reciprocal pattern without compensation in order to demonstrate improved LE strength. Baseline:  Goal status: INITIAL  4.   Patient will demonstrate grade of 4+/5 MMT grade in all  tested musculature as evidence of improved strength to assist with stair ambulation and gait.  Baseline:  Goal status: INITIAL  5.  Patient will be able to complete 5x STS in under 11.4 seconds in order to demonstrate improved functional strength. Baseline:  Goal status: INITIAL    PLAN:  PT FREQUENCY: 1-2x/week  PT DURATION: 8 weeks  PLANNED INTERVENTIONS: 97164- PT Re-evaluation, 97110-Therapeutic exercises, 97530- Therapeutic activity, [id number]- Neuromuscular re-education, 97535- Self Care, 02859- Manual therapy, [id number]- Gait training, [phone]- Orthotic Fit/training, [phone]- Canalith repositioning, [id number]- Aquatic Therapy, [phone]- Splinting, [phone]- Wound care (first 20 sq cm), 97598- Wound care (each additional 20 sq cm)Patient/Family education, Balance training, Stair training, Taping, Dry Needling, Joint mobilization, Joint manipulation, Spinal manipulation, Spinal mobilization, Scar mobilization, and DME instructions.  PLAN FOR NEXT SESSION: quad and glute strength, functional strength, show machines as able   Prentice GORMAN Stains, PT, DPT 10/21/2024, 11:34 AM  For all possible CPT codes, reference the Planned Interventions line above.     Check all conditions that are expected to impact treatment: {Conditions expected to impact treatment:None of these apply   If treatment provided at initial evaluation, no treatment charged due to lack of authorization.

## 2024-11-04 ENCOUNTER — Encounter (HOSPITAL_BASED_OUTPATIENT_CLINIC_OR_DEPARTMENT_OTHER): Payer: Self-pay | Admitting: Rehabilitative and Restorative Service Providers"

## 2024-11-04 ENCOUNTER — Ambulatory Visit (HOSPITAL_BASED_OUTPATIENT_CLINIC_OR_DEPARTMENT_OTHER): Attending: Family Medicine | Admitting: Rehabilitative and Restorative Service Providers"

## 2024-11-04 DIAGNOSIS — R262 Difficulty in walking, not elsewhere classified: Secondary | ICD-10-CM | POA: Diagnosis present

## 2024-11-04 DIAGNOSIS — R2689 Other abnormalities of gait and mobility: Secondary | ICD-10-CM | POA: Insufficient documentation

## 2024-11-04 DIAGNOSIS — R29898 Other symptoms and signs involving the musculoskeletal system: Secondary | ICD-10-CM | POA: Diagnosis present

## 2024-11-04 DIAGNOSIS — M6281 Muscle weakness (generalized): Secondary | ICD-10-CM | POA: Insufficient documentation

## 2024-11-04 DIAGNOSIS — M25561 Pain in right knee: Secondary | ICD-10-CM | POA: Insufficient documentation

## 2024-11-04 DIAGNOSIS — M25562 Pain in left knee: Secondary | ICD-10-CM | POA: Insufficient documentation

## 2024-11-04 NOTE — Therapy (Signed)
 OUTPATIENT PHYSICAL THERAPY LOWER EXTREMITY TREATMENT   Patient Name: Alisha Thomas MRN: [id number] DOB:06/01/1971, 53 y.o., female Today's Date: 11/04/2024  END OF SESSION:  PT End of Session - 11/04/24 0942     Visit Number 2    Number of Visits 16    Date for Recertification  12/16/24    Authorization Type [place] MEDICAID [employer]    PT Start Time [phone]    PT Stop Time 1021    PT Time Calculation (min) 42 min    Activity Tolerance Patient tolerated treatment well;No increased pain    Behavior During Therapy WFL for tasks assessed/performed           Past Medical History:  Diagnosis Date   Anemia    Hypertension    Ovarian cyst    Pelvic adhesions    Stomach ulcer    Past Surgical History:  Procedure Laterality Date   BRAIN TUMOR EXCISION  2024   BREAST CYST ASPIRATION Left 2021   ECTOPIC PREGNANCY SURGERY     OVARIAN CYST SURGERY     PELVIC LAPAROSCOPY  11/30/2006   Diag Lap lysis of adhesions   Patient Active Problem List   Diagnosis Date Noted   Symptomatic anemia 03/26/2020   Iron deficiency anemia due to chronic blood loss 02/24/2018   Fibroids 07/04/2015   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   Peptic ulcer 09/10/2011   Ovarian cyst    Pelvic adhesions     PCP: Vaughn Lauraine KATHEE DEVONNA  REFERRING PROVIDER: Irving Delinda ORN, MD  REFERRING DIAG: [phone] (ICD-10-CM) - Patellofemoral disorders, right knee M22.2X2 (ICD-10-CM) - Patellofemoral disorders, left knee  THERAPY DIAG:  Pain in both knees, unspecified chronicity  Muscle weakness (generalized)  Other abnormalities of gait and mobility  Other symptoms and signs involving the musculoskeletal system  Difficulty in walking, not elsewhere classified  Rationale for Evaluation and Treatment: Rehabilitation  ONSET DATE: chronic  SUBJECTIVE:   SUBJECTIVE STATEMENT: Pt arrived late to appt. I have done my exercises 2x/day and I have a membership here and I have come 2x  Patient  states L sided weakness due brain surgery. Has been doing rehab to strengthen L side. Now limited by R knee pain.   PERTINENT HISTORY: HTN, Right frontal meningoma resection 05/25/23 with LLE weaknes PAIN:  Are you having pain? No at rest  PRECAUTIONS: None  WEIGHT BEARING RESTRICTIONS: No  FALLS:  Has patient fallen in last 6 months? No  OCCUPATION: Substitute teaching  PLOF: Independent  PATIENT GOALS: strengthen legs, be strong and active   OBJECTIVE: (objective measures from initial evaluation unless otherwise dated)  PATIENT SURVEYS:  LEFS  Extreme difficulty/unable (0), Quite a bit of difficulty (1), Moderate difficulty (2), Little difficulty (3), No difficulty (4) Survey date:  10/21/24  Any of your usual work, housework or school activities 1  2. Usual hobbies, recreational or sporting activities 0  3. Getting into/out of the bath 4  4. Walking between rooms 4  5. Putting on socks/shoes 4  6. Squatting  0  7. Lifting an object, like a bag of groceries from the floor 1  8. Performing light activities around your home 4  9. Performing heavy activities around your home 0  10. Getting into/out of a car 4  11. Walking 2 blocks 4  12. Walking 1 mile 1  13. Going up/down 10 stairs (1 flight) 1  14. Standing for 1 hour 2  15.  sitting for 1 hour 4  16. Running  on even ground 2  17. Running on uneven ground 1  18. Making sharp turns while running fast 0  19. Hopping  1  20. Rolling over in bed 4  Score total:  42/80     COGNITION: Overall cognitive status: Within functional limits for tasks assessed     SENSATION: WFL    PALPATION: Patellar mobs WFL, no tenderness grossly R knee  LOWER EXTREMITY ROM:WFL for tasks assessed  Active ROM Right eval Left eval  Hip flexion    Hip extension    Hip abduction    Hip adduction    Hip internal rotation    Hip external rotation    Knee flexion    Knee extension    Ankle dorsiflexion    Ankle plantarflexion     Ankle inversion    Ankle eversion     (Blank rows = not tested) *= pain/symptoms  LOWER EXTREMITY MMT:  MMT Right eval Left eval  Hip flexion 4+ 4+  Hip extension 4 4-  Hip abduction 4 4-  Hip adduction    Hip internal rotation    Hip external rotation    Knee flexion 5 4+*  Knee extension 4+* 4+  Ankle dorsiflexion    Ankle plantarflexion    Ankle inversion    Ankle eversion     (Blank rows = not tested) *= pain/symptoms   FUNCTIONAL TESTS:  5 times sit to stand: 14.16 seconds without UE support, mild valgus R>L which increases with reps Stairs: alternating pattern 7 inch, no UE support, trendelenberg bilaterally, valgus with descending R>L   GAIT: Distance walked: 100 feet Assistive device utilized: None Level of assistance: Complete Independence Comments: trendelenburg bilaterally   TODAY'S TREATMENT:                                                                                                                              DATE:   [facility] Adult PT Treatment:                                                DATE: 11/04/24 Therapeutic Exercise: Review HEP Prone hip extension x 15 Prone hamstring curl x 15 with 2 lb weight Prone 2 lb hamstring curl x 15 3 lb SAQ x 15 unilat and bil SLR x 15 with concentration on quad contraction with contraction being easier on the R than L with L trying to substitute with glute activation. Less L glute activation if perform R SLR first. VMO SLR x 10 performed first on the R LE and then the L LE Minisquats at rail x 15 All performed bil. No pain after tx. Pain monitored throughout.    10/21/24 Eval and HEP    PATIENT EDUCATION:  Education details: Patient educated on exam findings, POC, scope of PT, HEP, relevant anatomy  and biomechanics. Person educated: Patient Education method: Explanation, Demonstration, and Handouts Education comprehension: verbalized understanding, returned demonstration, verbal cues required, and  tactile cues required  HOME EXERCISE PROGRAM: Access Code: [email or web address] URL: [email or web address] Date: 10/21/2024 Prepared by: Prentice Zaunegger  Exercises - Supine Bridge  - 1 x daily - 7 x weekly - 2 sets - 10 reps - Sidelying Hip Abduction  - 1 x daily - 7 x weekly - 2 sets - 10 reps - Seated Long Arc Quad  - 1 x daily - 7 x weekly - 10 reps - 5-10 second hold  ASSESSMENT:  CLINICAL IMPRESSION: Pt tolerated progression of therex without increased pain. Therex focused on concentration of strengthening of bil LE muscles to assist with reduction of knee pain and improved stability. PT noted pt has L glute activation more than quad activation with L SLR but if she performs R SLR first, she has less compensation on L LE to perform.   Eval: Patient a 53 y.o. y.o. female who was seen today for physical therapy evaluation and treatment for R knee pain and bilateral LE strength deficits. Patient presents with pain limited deficits in bilateral knee strength, ROM, endurance, activity tolerance, gait, and functional mobility with ADL. Patient is having to modify and restrict ADL as indicated by outcome measure score as well as subjective information and objective measures which is affecting overall participation. Patient will benefit from skilled physical therapy in order to improve function and reduce impairment.  OBJECTIVE IMPAIRMENTS: Abnormal gait, decreased activity tolerance, decreased balance, decreased endurance, decreased mobility, difficulty walking, decreased ROM, decreased strength, increased muscle spasms, impaired flexibility, improper body mechanics, and pain  ACTIVITY LIMITATIONS: lifting, bending, standing, squatting, stairs, transfers, locomotion level, and caring for others  PARTICIPATION LIMITATIONS: meal prep, cleaning, laundry, shopping, community activity, occupation, and yard work  PERSONAL FACTORS: Time since onset of injury/illness/exacerbation and 1-2  comorbidities: HTN, Right frontal meningoma resection 05/25/23 with LLE weaknes are also affecting patient's functional outcome.   REHAB POTENTIAL: Good  CLINICAL DECISION MAKING: Evolving/moderate complexity  EVALUATION COMPLEXITY: Moderate   GOALS: Goals reviewed with patient? Yes  SHORT TERM GOALS: Target date: 11/18/2024    Patient will be independent with HEP in order to improve functional outcomes. Baseline: Goal status: INITIAL  2.  Patient will report at least 25% improvement in symptoms for improved quality of life. Baseline: Goal status: INITIAL    LONG TERM GOALS: Target date: 12/16/2024    Patient will report at least 75% improvement in symptoms for improved quality of life. Baseline:  Goal status: INITIAL  2.  Patient will improve LEFS score by at least 10 points in order to indicate improved tolerance to activity. Baseline:  Goal status: INITIAL  3.  Patient will be able to navigate stairs with reciprocal pattern without compensation in order to demonstrate improved LE strength. Baseline:  Goal status: INITIAL  4.   Patient will demonstrate grade of 4+/5 MMT grade in all tested musculature as evidence of improved strength to assist with stair ambulation and gait.  Baseline:  Goal status: INITIAL  5.  Patient will be able to complete 5x STS in under 11.4 seconds in order to demonstrate improved functional strength. Baseline:  Goal status: INITIAL    PLAN:  PT FREQUENCY: 1-2x/week  PT DURATION: 8 weeks  PLANNED INTERVENTIONS: 97164- PT Re-evaluation, 97110-Therapeutic exercises, 97530- Therapeutic activity, [id number]- Neuromuscular re-education, 97535- Self Care, 02859- Manual therapy, [id number]- Gait training, [phone]- Orthotic Fit/training, [phone]- Canalith repositioning, [id number]- Aquatic Therapy, [phone]-  Splinting, 02402- Wound care (first 20 sq cm), 97598- Wound care (each additional 20 sq cm)Patient/Family education, Balance training, Stair training, Taping,  Dry Needling, Joint mobilization, Joint manipulation, Spinal manipulation, Spinal mobilization, Scar mobilization, and DME instructions.  PLAN FOR NEXT SESSION: quad and glute strength, functional strength, show machines as able   Alger Ada, PT, DPT 11/04/2024, 10:26 AM  For all possible CPT codes, reference the Planned Interventions line above.     Check all conditions that are expected to impact treatment: {Conditions expected to impact treatment:None of these apply   If treatment provided at initial evaluation, no treatment charged due to lack of authorization.

## 2024-11-11 ENCOUNTER — Ambulatory Visit (HOSPITAL_BASED_OUTPATIENT_CLINIC_OR_DEPARTMENT_OTHER): Admitting: Physical Therapy

## 2024-11-11 ENCOUNTER — Encounter (HOSPITAL_BASED_OUTPATIENT_CLINIC_OR_DEPARTMENT_OTHER): Payer: Self-pay | Admitting: Physical Therapy

## 2024-11-11 DIAGNOSIS — M6281 Muscle weakness (generalized): Secondary | ICD-10-CM

## 2024-11-11 DIAGNOSIS — M25561 Pain in right knee: Secondary | ICD-10-CM

## 2024-11-11 DIAGNOSIS — R2689 Other abnormalities of gait and mobility: Secondary | ICD-10-CM

## 2024-11-11 DIAGNOSIS — R29898 Other symptoms and signs involving the musculoskeletal system: Secondary | ICD-10-CM

## 2024-11-11 NOTE — Therapy (Signed)
 OUTPATIENT PHYSICAL THERAPY LOWER EXTREMITY TREATMENT   Patient Name: Alisha Thomas MRN: [id number] DOB:[birth year], 53 y.o., female Today's Date: 11/11/2024  END OF SESSION:  PT End of Session - 11/11/24 1004     Visit Number 3    Number of Visits 16    Date for Recertification  12/16/24    Authorization Type [place] MEDICAID [employer]    PT Start Time 1002    PT Stop Time 1042    PT Time Calculation (min) 40 min    Activity Tolerance Patient tolerated treatment well;No increased pain    Behavior During Therapy WFL for tasks assessed/performed           Past Medical History:  Diagnosis Date   Anemia    Hypertension    Ovarian cyst    Pelvic adhesions    Stomach ulcer    Past Surgical History:  Procedure Laterality Date   BRAIN TUMOR EXCISION  2024   BREAST CYST ASPIRATION Left 2021   ECTOPIC PREGNANCY SURGERY     OVARIAN CYST SURGERY     PELVIC LAPAROSCOPY  11/30/2006   Diag Lap lysis of adhesions   Patient Active Problem List   Diagnosis Date Noted   Symptomatic anemia 03/26/2020   Iron deficiency anemia due to chronic blood loss 02/24/2018   Fibroids 07/04/2015   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   Peptic ulcer 09/10/2011   Ovarian cyst    Pelvic adhesions     PCP: Vaughn Lauraine KATHEE DEVONNA  REFERRING PROVIDER: Irving Delinda ORN, MD  REFERRING DIAG: [phone] (ICD-10-CM) - Patellofemoral disorders, right knee M22.2X2 (ICD-10-CM) - Patellofemoral disorders, left knee  THERAPY DIAG:  Pain in both knees, unspecified chronicity  Muscle weakness (generalized)  Other abnormalities of gait and mobility  Other symptoms and signs involving the musculoskeletal system  Rationale for Evaluation and Treatment: Rehabilitation  ONSET DATE: chronic  SUBJECTIVE:   SUBJECTIVE STATEMENT: Patient states doing HEP and coming to the gym.    PERTINENT HISTORY: HTN, Right frontal meningoma resection 05/25/23 with LLE weakness PAIN:  Are you having pain?  No at rest  PRECAUTIONS: None  WEIGHT BEARING RESTRICTIONS: No  FALLS:  Has patient fallen in last 6 months? No  OCCUPATION: Substitute teaching  PLOF: Independent  PATIENT GOALS: strengthen legs, be strong and active   OBJECTIVE: (objective measures from initial evaluation unless otherwise dated)  PATIENT SURVEYS:  LEFS  Extreme difficulty/unable (0), Quite a bit of difficulty (1), Moderate difficulty (2), Little difficulty (3), No difficulty (4) Survey date:  10/21/24  Any of your usual work, housework or school activities 1  2. Usual hobbies, recreational or sporting activities 0  3. Getting into/out of the bath 4  4. Walking between rooms 4  5. Putting on socks/shoes 4  6. Squatting  0  7. Lifting an object, like a bag of groceries from the floor 1  8. Performing light activities around your home 4  9. Performing heavy activities around your home 0  10. Getting into/out of a car 4  11. Walking 2 blocks 4  12. Walking 1 mile 1  13. Going up/down 10 stairs (1 flight) 1  14. Standing for 1 hour 2  15.  sitting for 1 hour 4  16. Running on even ground 2  17. Running on uneven ground 1  18. Making sharp turns while running fast 0  19. Hopping  1  20. Rolling over in bed 4  Score total:  42/80     COGNITION:  Overall cognitive status: Within functional limits for tasks assessed     SENSATION: WFL    PALPATION: Patellar mobs WFL, no tenderness grossly R knee  LOWER EXTREMITY ROM:WFL for tasks assessed  Active ROM Right eval Left eval  Hip flexion    Hip extension    Hip abduction    Hip adduction    Hip internal rotation    Hip external rotation    Knee flexion    Knee extension    Ankle dorsiflexion    Ankle plantarflexion    Ankle inversion    Ankle eversion     (Blank rows = not tested) *= pain/symptoms  LOWER EXTREMITY MMT:  MMT Right eval Left eval  Hip flexion 4+ 4+  Hip extension 4 4-  Hip abduction 4 4-  Hip adduction    Hip  internal rotation    Hip external rotation    Knee flexion 5 4+*  Knee extension 4+* 4+  Ankle dorsiflexion    Ankle plantarflexion    Ankle inversion    Ankle eversion     (Blank rows = not tested) *= pain/symptoms   FUNCTIONAL TESTS:  5 times sit to stand: 14.16 seconds without UE support, mild valgus R>L which increases with reps Stairs: alternating pattern 7 inch, no UE support, trendelenberg bilaterally, valgus with descending R>L   GAIT: Distance walked: 100 feet Assistive device utilized: None Level of assistance: Complete Independence Comments: trendelenburg bilaterally   TODAY'S TREATMENT:                                                                                                                              DATE:  11/11/24 LAQ 3# 10 x 5-10 second holds Squat chair taps 2 x 10 Step up 6 inch 2 x 10 Lateral step up 6 inch 2 x 10 Lateral stepping in mini squat 4 x 15 feet Standing hip abduction GTB at knees 3 x 10 RDL 5# kb 3 x 10   [facility] Adult PT Treatment:                                                DATE: 11/04/24 Therapeutic Exercise: Review HEP Prone hip extension x 15 Prone hamstring curl x 15 with 2 lb weight Prone 2 lb hamstring curl x 15 3 lb SAQ x 15 unilat and bil SLR x 15 with concentration on quad contraction with contraction being easier on the R than L with L trying to substitute with glute activation. Less L glute activation if perform R SLR first. VMO SLR x 10 performed first on the R LE and then the L LE Minisquats at rail x 15 All performed bil. No pain after tx. Pain monitored throughout.    10/21/24 Eval and HEP    PATIENT EDUCATION:  Education details:  Patient educated on exam findings, POC, scope of PT, HEP, relevant anatomy and biomechanics. 11/11/24: HEP Person educated: Patient Education method: Explanation, Demonstration, and Handouts Education comprehension: verbalized understanding, returned demonstration, verbal cues  required, and tactile cues required  HOME EXERCISE PROGRAM: Access Code: [email or web address] URL: [email or web address] Date: 10/21/2024 Prepared by: Prentice Anjana Cheek  Exercises - Supine Bridge  - 1 x daily - 7 x weekly - 2 sets - 10 reps - Sidelying Hip Abduction  - 1 x daily - 7 x weekly - 2 sets - 10 reps - Seated Long Arc Quad  - 1 x daily - 7 x weekly - 10 reps - 5-10 second hold  ASSESSMENT:  CLINICAL IMPRESSION: Patient with impaired squat mechanics with tendency to perform with anterior translation, multimodial cueing provided with limited carry over but mechanics improve with chair tap plus cueing. Additional functional strengthening performed with cueing for mechanics and reduced valgus throughout. Patient will continue to benefit from physical therapy in order to improve function and reduce impairment.   OBJECTIVE IMPAIRMENTS: Abnormal gait, decreased activity tolerance, decreased balance, decreased endurance, decreased mobility, difficulty walking, decreased ROM, decreased strength, increased muscle spasms, impaired flexibility, improper body mechanics, and pain  ACTIVITY LIMITATIONS: lifting, bending, standing, squatting, stairs, transfers, locomotion level, and caring for others  PARTICIPATION LIMITATIONS: meal prep, cleaning, laundry, shopping, community activity, occupation, and yard work  PERSONAL FACTORS: Time since onset of injury/illness/exacerbation and 1-2 comorbidities: HTN, Right frontal meningoma resection 05/25/23 with LLE weaknes are also affecting patient's functional outcome.   REHAB POTENTIAL: Good  CLINICAL DECISION MAKING: Evolving/moderate complexity  EVALUATION COMPLEXITY: Moderate   GOALS: Goals reviewed with patient? Yes  SHORT TERM GOALS: Target date: 11/18/2024    Patient will be independent with HEP in order to improve functional outcomes. Baseline: Goal status: INITIAL  2.  Patient will report at least 25% improvement in symptoms  for improved quality of life. Baseline: Goal status: INITIAL    LONG TERM GOALS: Target date: 12/16/2024    Patient will report at least 75% improvement in symptoms for improved quality of life. Baseline:  Goal status: INITIAL  2.  Patient will improve LEFS score by at least 10 points in order to indicate improved tolerance to activity. Baseline:  Goal status: INITIAL  3.  Patient will be able to navigate stairs with reciprocal pattern without compensation in order to demonstrate improved LE strength. Baseline:  Goal status: INITIAL  4.   Patient will demonstrate grade of 4+/5 MMT grade in all tested musculature as evidence of improved strength to assist with stair ambulation and gait.  Baseline:  Goal status: INITIAL  5.  Patient will be able to complete 5x STS in under 11.4 seconds in order to demonstrate improved functional strength. Baseline:  Goal status: INITIAL    PLAN:  PT FREQUENCY: 1-2x/week  PT DURATION: 8 weeks  PLANNED INTERVENTIONS: 97164- PT Re-evaluation, 97110-Therapeutic exercises, 97530- Therapeutic activity, [id number]- Neuromuscular re-education, 97535- Self Care, 02859- Manual therapy, [id number]- Gait training, [phone]- Orthotic Fit/training, [phone]- Canalith repositioning, [id number]- Aquatic Therapy, [phone]- Splinting, [phone]- Wound care (first 20 sq cm), 97598- Wound care (each additional 20 sq cm)Patient/Family education, Balance training, Stair training, Taping, Dry Needling, Joint mobilization, Joint manipulation, Spinal manipulation, Spinal mobilization, Scar mobilization, and DME instructions.  PLAN FOR NEXT SESSION: quad and glute strength, functional strength, show machines as able   Prentice GORMAN Stains, PT, DPT 11/11/2024, 10:42 AM  For all possible CPT codes, reference the Planned Interventions line above.  Check all conditions that are expected to impact treatment: {Conditions expected to impact treatment:None of these apply   If treatment provided at  initial evaluation, no treatment charged due to lack of authorization.

## 2024-11-18 ENCOUNTER — Ambulatory Visit (HOSPITAL_BASED_OUTPATIENT_CLINIC_OR_DEPARTMENT_OTHER): Admitting: Physical Therapy

## 2024-11-18 ENCOUNTER — Encounter (HOSPITAL_BASED_OUTPATIENT_CLINIC_OR_DEPARTMENT_OTHER): Payer: Self-pay | Admitting: Physical Therapy

## 2024-11-18 DIAGNOSIS — M6281 Muscle weakness (generalized): Secondary | ICD-10-CM

## 2024-11-18 DIAGNOSIS — M25561 Pain in right knee: Secondary | ICD-10-CM | POA: Diagnosis not present

## 2024-11-18 DIAGNOSIS — R2689 Other abnormalities of gait and mobility: Secondary | ICD-10-CM

## 2024-11-18 DIAGNOSIS — R29898 Other symptoms and signs involving the musculoskeletal system: Secondary | ICD-10-CM

## 2024-11-18 NOTE — Therapy (Signed)
 " OUTPATIENT PHYSICAL THERAPY LOWER EXTREMITY TREATMENT   Patient Name: Alisha Thomas MRN: [id number] DOB:08-10-1971, 53 y.o., female Today's Date: 11/18/2024  END OF SESSION:  PT End of Session - 11/18/24 0959     Visit Number 4    Number of Visits 16    Date for Recertification  12/16/24    Authorization Type [place] MEDICAID [employer]    PT Start Time 1000    PT Stop Time 1040    PT Time Calculation (min) 40 min    Activity Tolerance Patient tolerated treatment well;No increased pain    Behavior During Therapy WFL for tasks assessed/performed           Past Medical History:  Diagnosis Date   Anemia    Hypertension    Ovarian cyst    Pelvic adhesions    Stomach ulcer    Past Surgical History:  Procedure Laterality Date   BRAIN TUMOR EXCISION  2024   BREAST CYST ASPIRATION Left 2021   ECTOPIC PREGNANCY SURGERY     OVARIAN CYST SURGERY     PELVIC LAPAROSCOPY  11/30/2006   Diag Lap lysis of adhesions   Patient Active Problem List   Diagnosis Date Noted   Symptomatic anemia 03/26/2020   Iron deficiency anemia due to chronic blood loss 02/24/2018   Fibroids 07/04/2015   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   Peptic ulcer 09/10/2011   Ovarian cyst    Pelvic adhesions     PCP: Vaughn Lauraine KATHEE DEVONNA  REFERRING PROVIDER: Irving Delinda ORN, MD  REFERRING DIAG: [phone] (ICD-10-CM) - Patellofemoral disorders, right knee M22.2X2 (ICD-10-CM) - Patellofemoral disorders, left knee  THERAPY DIAG:  Pain in both knees, unspecified chronicity  Muscle weakness (generalized)  Other abnormalities of gait and mobility  Other symptoms and signs involving the musculoskeletal system  Rationale for Evaluation and Treatment: Rehabilitation  ONSET DATE: chronic  SUBJECTIVE:   SUBJECTIVE STATEMENT: Patient states doing HEP consistently. Patient states symptoms are getting better with stairs and hills. Hips are getting stronger. Was not able to get to the gym this  week but did HEP.   PERTINENT HISTORY: HTN, Right frontal meningoma resection 05/25/23 with LLE weakness PAIN:  Are you having pain? No at rest  PRECAUTIONS: None  WEIGHT BEARING RESTRICTIONS: No  FALLS:  Has patient fallen in last 6 months? No  OCCUPATION: Substitute teaching  PLOF: Independent  PATIENT GOALS: strengthen legs, be strong and active   OBJECTIVE: (objective measures from initial evaluation unless otherwise dated)  PATIENT SURVEYS:  LEFS  Extreme difficulty/unable (0), Quite a bit of difficulty (1), Moderate difficulty (2), Little difficulty (3), No difficulty (4) Survey date:  10/21/24  Any of your usual work, housework or school activities 1  2. Usual hobbies, recreational or sporting activities 0  3. Getting into/out of the bath 4  4. Walking between rooms 4  5. Putting on socks/shoes 4  6. Squatting  0  7. Lifting an object, like a bag of groceries from the floor 1  8. Performing light activities around your home 4  9. Performing heavy activities around your home 0  10. Getting into/out of a car 4  11. Walking 2 blocks 4  12. Walking 1 mile 1  13. Going up/down 10 stairs (1 flight) 1  14. Standing for 1 hour 2  15.  sitting for 1 hour 4  16. Running on even ground 2  17. Running on uneven ground 1  18. Making sharp turns while running fast  0  19. Hopping  1  20. Rolling over in bed 4  Score total:  42/80     COGNITION: Overall cognitive status: Within functional limits for tasks assessed     SENSATION: WFL    PALPATION: Patellar mobs WFL, no tenderness grossly R knee  LOWER EXTREMITY ROM:WFL for tasks assessed  Active ROM Right eval Left eval  Hip flexion    Hip extension    Hip abduction    Hip adduction    Hip internal rotation    Hip external rotation    Knee flexion    Knee extension    Ankle dorsiflexion    Ankle plantarflexion    Ankle inversion    Ankle eversion     (Blank rows = not tested) *=  pain/symptoms  LOWER EXTREMITY MMT:  MMT Right eval Left eval  Hip flexion 4+ 4+  Hip extension 4 4-  Hip abduction 4 4-  Hip adduction    Hip internal rotation    Hip external rotation    Knee flexion 5 4+*  Knee extension 4+* 4+  Ankle dorsiflexion    Ankle plantarflexion    Ankle inversion    Ankle eversion     (Blank rows = not tested) *= pain/symptoms   FUNCTIONAL TESTS:  5 times sit to stand: 14.16 seconds without UE support, mild valgus R>L which increases with reps Stairs: alternating pattern 7 inch, no UE support, trendelenberg bilaterally, valgus with descending R>L   GAIT: Distance walked: 100 feet Assistive device utilized: None Level of assistance: Complete Independence Comments: trendelenburg bilaterally   TODAY'S TREATMENT:                                                                                                                              DATE:  11/18/24 Bike 5 minutes for dynamic warm up Squat chair taps 3 x 10 Step up 6 inch with contralateral knee drive 2 x 10 Lateral step down 6 inch 2 x 10 Lateral stepping in mini squat GTB at knees 6 x 15 feet Standing hip abduction GTB at knees 1 x 10 RDL 5# kb 3 x 10   11/11/24 LAQ 3# 10 x 5-10 second holds Squat chair taps 2 x 10 Step up 6 inch 2 x 10 Lateral step up 6 inch 2 x 10 Lateral stepping in mini squat 4 x 15 feet Standing hip abduction GTB at knees 3 x 10 RDL 5# kb 3 x 10   [facility] Adult PT Treatment:                                                DATE: 11/04/24 Therapeutic Exercise: Review HEP Prone hip extension x 15 Prone hamstring curl x 15 with 2 lb weight Prone 2 lb hamstring curl x 15 3 lb  SAQ x 15 unilat and bil SLR x 15 with concentration on quad contraction with contraction being easier on the R than L with L trying to substitute with glute activation. Less L glute activation if perform R SLR first. VMO SLR x 10 performed first on the R LE and then the L LE Minisquats at  rail x 15 All performed bil. No pain after tx. Pain monitored throughout.    10/21/24 Eval and HEP    PATIENT EDUCATION:  Education details: Patient educated on exam findings, POC, scope of PT, HEP, relevant anatomy and biomechanics. 11/11/24: HEP 11/18/24: HEP, exercise mechanics Person educated: Patient Education method: Explanation, Demonstration, and Handouts Education comprehension: verbalized understanding, returned demonstration, verbal cues required, and tactile cues required  HOME EXERCISE PROGRAM: Access Code: [email or web address] URL: [email or web address] Date: 10/21/2024 Prepared by: Prentice Savannha Welle  Exercises - Supine Bridge  - 1 x daily - 7 x weekly - 2 sets - 10 reps - Sidelying Hip Abduction  - 1 x daily - 7 x weekly - 2 sets - 10 reps - Seated Long Arc Quad  - 1 x daily - 7 x weekly - 10 reps - 5-10 second hold  ASSESSMENT:  CLINICAL IMPRESSION: Patient appears to be progressing well with PT intervention with improving function and reduction in symptoms. Began session on bike for dynamic warm up. Continued with quad/glute and functional strengthening. She requires cueing for proper squat mechanics with good/fair carry over. Mild valgus with step down exercise which improves intermittently following cueing. Cueing required for rest breaks and switching LE due to tendency to continue performing reps despite cueing for number to perform prior to exercise  Patient will continue to benefit from physical therapy in order to improve function and reduce impairment.   OBJECTIVE IMPAIRMENTS: Abnormal gait, decreased activity tolerance, decreased balance, decreased endurance, decreased mobility, difficulty walking, decreased ROM, decreased strength, increased muscle spasms, impaired flexibility, improper body mechanics, and pain  ACTIVITY LIMITATIONS: lifting, bending, standing, squatting, stairs, transfers, locomotion level, and caring for others  PARTICIPATION  LIMITATIONS: meal prep, cleaning, laundry, shopping, community activity, occupation, and yard work  PERSONAL FACTORS: Time since onset of injury/illness/exacerbation and 1-2 comorbidities: HTN, Right frontal meningoma resection 05/25/23 with LLE weaknes are also affecting patient's functional outcome.   REHAB POTENTIAL: Good  CLINICAL DECISION MAKING: Evolving/moderate complexity  EVALUATION COMPLEXITY: Moderate   GOALS: Goals reviewed with patient? Yes  SHORT TERM GOALS: Target date: 11/18/2024    Patient will be independent with HEP in order to improve functional outcomes. Baseline: Goal status: INITIAL  2.  Patient will report at least 25% improvement in symptoms for improved quality of life. Baseline: Goal status: INITIAL    LONG TERM GOALS: Target date: 12/16/2024    Patient will report at least 75% improvement in symptoms for improved quality of life. Baseline:  Goal status: INITIAL  2.  Patient will improve LEFS score by at least 10 points in order to indicate improved tolerance to activity. Baseline:  Goal status: INITIAL  3.  Patient will be able to navigate stairs with reciprocal pattern without compensation in order to demonstrate improved LE strength. Baseline:  Goal status: INITIAL  4.   Patient will demonstrate grade of 4+/5 MMT grade in all tested musculature as evidence of improved strength to assist with stair ambulation and gait.  Baseline:  Goal status: INITIAL  5.  Patient will be able to complete 5x STS in under 11.4 seconds in order to demonstrate improved functional strength. Baseline:  Goal status: INITIAL    PLAN:  PT FREQUENCY: 1-2x/week  PT DURATION: 8 weeks  PLANNED INTERVENTIONS: 97164- PT Re-evaluation, 97110-Therapeutic exercises, 97530- Therapeutic activity, [id number]- Neuromuscular re-education, 97535- Self Care, 02859- Manual therapy, [phone]- Gait training, [phone]- Orthotic Fit/training, [phone]- Canalith repositioning, [id number]- Aquatic  Therapy, [phone]- Splinting, [phone]- Wound care (first 20 sq cm), 97598- Wound care (each additional 20 sq cm)Patient/Family education, Balance training, Stair training, Taping, Dry Needling, Joint mobilization, Joint manipulation, Spinal manipulation, Spinal mobilization, Scar mobilization, and DME instructions.  PLAN FOR NEXT SESSION: quad and glute strength, functional strength, show machines as able   Prentice GORMAN Stains, PT, DPT 11/18/2024, 10:41 AM  For all possible CPT codes, reference the Planned Interventions line above.     Check all conditions that are expected to impact treatment: {Conditions expected to impact treatment:None of these apply   If treatment provided at initial evaluation, no treatment charged due to lack of authorization.       "

## 2024-11-28 ENCOUNTER — Encounter: Payer: Self-pay | Admitting: Neurosurgery

## 2024-11-29 ENCOUNTER — Encounter: Payer: Self-pay | Admitting: Neurosurgery

## 2024-11-29 ENCOUNTER — Telehealth: Payer: Self-pay | Admitting: Neurosurgery

## 2024-11-29 NOTE — Telephone Encounter (Signed)
 [facility] Called to get a new pre-auth for MRI on brain w/ and w/o contrast for appt on 12/01/24. Lady that called number is [phone]. [phone]

## 2024-12-01 ENCOUNTER — Ambulatory Visit
Admission: RE | Admit: 2024-12-01 | Discharge: 2024-12-01 | Disposition: A | Discharge status: Home / Self Care | Source: Ambulatory Visit | Attending: Neurosurgery | Admitting: Neurosurgery

## 2024-12-01 DIAGNOSIS — D329 Benign neoplasm of meninges, unspecified: Secondary | ICD-10-CM

## 2024-12-01 MED ORDER — GADOPICLENOL 0.5 MMOL/ML IV SOLN
6.0000 mL | Freq: Once | INTRAVENOUS | Status: AC | PRN
  Administered 2024-12-01: 6 mL via INTRAVENOUS

## 2024-12-02 ENCOUNTER — Ambulatory Visit (HOSPITAL_BASED_OUTPATIENT_CLINIC_OR_DEPARTMENT_OTHER): Attending: Family Medicine | Admitting: Rehabilitative and Restorative Service Providers"

## 2024-12-02 ENCOUNTER — Encounter (HOSPITAL_BASED_OUTPATIENT_CLINIC_OR_DEPARTMENT_OTHER): Payer: Self-pay | Admitting: Rehabilitative and Restorative Service Providers"

## 2024-12-02 DIAGNOSIS — M25561 Pain in right knee: Secondary | ICD-10-CM | POA: Insufficient documentation

## 2024-12-02 DIAGNOSIS — R2689 Other abnormalities of gait and mobility: Secondary | ICD-10-CM | POA: Diagnosis present

## 2024-12-02 DIAGNOSIS — M6281 Muscle weakness (generalized): Secondary | ICD-10-CM | POA: Diagnosis present

## 2024-12-02 DIAGNOSIS — M25562 Pain in left knee: Secondary | ICD-10-CM | POA: Insufficient documentation

## 2024-12-02 DIAGNOSIS — R29898 Other symptoms and signs involving the musculoskeletal system: Secondary | ICD-10-CM | POA: Diagnosis present

## 2024-12-02 DIAGNOSIS — R262 Difficulty in walking, not elsewhere classified: Secondary | ICD-10-CM | POA: Diagnosis present

## 2024-12-02 NOTE — Therapy (Signed)
 " OUTPATIENT PHYSICAL THERAPY LOWER EXTREMITY TREATMENT   Patient Name: Alisha Thomas MRN: [id number] DOB:07-17-1971, 54 y.o., female Today's Date: 12/02/2024  END OF SESSION:  PT End of Session - 12/02/24 0840     Visit Number 5    Number of Visits 16    Date for Recertification  12/16/24    Authorization Type [place] MEDICAID [employer]    PT Start Time [phone]    PT Stop Time 0922    PT Time Calculation (min) 44 min    Activity Tolerance Patient tolerated treatment well;No increased pain    Behavior During Therapy WFL for tasks assessed/performed            Past Medical History:  Diagnosis Date   Anemia    Hypertension    Ovarian cyst    Pelvic adhesions    Stomach ulcer    Past Surgical History:  Procedure Laterality Date   BRAIN TUMOR EXCISION  2024   BREAST CYST ASPIRATION Left 2021   ECTOPIC PREGNANCY SURGERY     OVARIAN CYST SURGERY     PELVIC LAPAROSCOPY  11/30/2006   Diag Lap lysis of adhesions   Patient Active Problem List   Diagnosis Date Noted   Symptomatic anemia 03/26/2020   Iron deficiency anemia due to chronic blood loss 02/24/2018   Fibroids 07/04/2015   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 I have been doing the gym program and it is good.  PERTINENT HISTORY: HTN, Right frontal meningoma resection 05/25/23 with LLE weakness PAIN:  Are you having pain? No at rest  PRECAUTIONS: None  WEIGHT BEARING  RESTRICTIONS: No  FALLS:  Has patient fallen in last 6 months? No  OCCUPATION: Substitute teaching  PLOF: Independent  PATIENT GOALS: strengthen legs, be strong and active   OBJECTIVE: (objective measures from initial evaluation unless otherwise dated)  PATIENT SURVEYS:  LEFS  Extreme difficulty/unable (0), Quite a bit of difficulty (1), Moderate difficulty (2), Little difficulty (3), No difficulty (4) Survey date:  10/21/24  Any of your usual work, housework or school activities 1  2. Usual hobbies, recreational or sporting activities 0  3. Getting into/out of the bath 4  4. Walking between rooms 4  5. Putting on socks/shoes 4  6. Squatting  0  7. Lifting an object, like a bag of groceries from the floor 1  8. Performing light activities around your home 4  9. Performing heavy activities around your home 0  10. Getting into/out of a car 4  11. Walking 2 blocks 4  12. Walking 1 mile 1  13. Going up/down 10 stairs (1 flight) 1  14. Standing for 1 hour 2  15.  sitting for 1 hour 4  16. Running on even ground 2  17. Running on uneven ground 1  18. Making sharp turns while running fast 0  19. Hopping  1  20. Rolling over in bed 4  Score total:  42/80     COGNITION: Overall cognitive status: Within functional  limits for tasks assessed     SENSATION: WFL    PALPATION: Patellar mobs WFL, no tenderness grossly R Thomas  LOWER EXTREMITY ROM:WFL for tasks assessed  Active ROM Right eval Left eval  Hip flexion    Hip extension    Hip abduction    Hip adduction    Hip internal rotation    Hip external rotation    Thomas flexion    Thomas extension    Ankle dorsiflexion    Ankle plantarflexion    Ankle inversion    Ankle eversion     (Blank rows = not tested) *= pain/symptoms  LOWER EXTREMITY MMT:  MMT Right eval Left eval  Hip flexion 4+ 4+  Hip extension 4 4-  Hip abduction 4 4-  Hip adduction    Hip internal rotation    Hip external rotation    Thomas  flexion 5 4+*  Thomas extension 4+* 4+  Ankle dorsiflexion    Ankle plantarflexion    Ankle inversion    Ankle eversion     (Blank rows = not tested) *= pain/symptoms   FUNCTIONAL TESTS:  5 times sit to stand: 14.16 seconds without UE support, mild valgus R>L which increases with reps Stairs: alternating pattern 7 inch, no UE support, trendelenberg bilaterally, valgus with descending R>L   GAIT: Distance walked: 100 feet Assistive device utilized: None Level of assistance: Complete Independence Comments: trendelenburg bilaterally   TODAY'S TREATMENT:                                                                                                                              DATE:  12/02/24 NuStep level 6 Les only x 5 min with PT discussing pain Wall slides x 20 with concentration on neutral LE alignment Static lunge at sink x 15 Airex Hamstring curl sinkside 3 lbs x 20 Airex hip abduction with Thomas flexed 3 lbs x 20 with PT verbal and tactile cues for technique and to decrease cadence Prone 3 lb hamstring curl x 20 Prone hip ext 3 lbs x 20 with difficulty performing on LLE with need for PT verbal and tactile cues for technique to decrease compensations Prayer stretch 2x30 sec  11/18/24 Bike 5 minutes for dynamic warm up Squat chair taps 3 x 10 Step up 6 inch with contralateral Thomas drive 2 x 10 Lateral step down 6 inch 2 x 10 Lateral stepping in mini squat GTB at knees 6 x 15 feet Standing hip abduction GTB at knees 1 x 10 RDL 5# kb 3 x 10   11/11/24 LAQ 3# 10 x 5-10 second holds Squat chair taps 2 x 10 Step up 6 inch 2 x 10 Lateral step up 6 inch 2 x 10 Lateral stepping in mini squat 4 x 15 feet Standing hip abduction GTB at knees 3 x 10 RDL 5# kb 3 x 10   [facility] Adult PT Treatment:  DATE: 11/04/24 Therapeutic Exercise: Review HEP Prone hip extension x 15 Prone hamstring curl x 15 with 2 lb weight Prone 2 lb hamstring curl  x 15 3 lb SAQ x 15 unilat and bil SLR x 15 with concentration on quad contraction with contraction being easier on the R than L with L trying to substitute with glute activation. Less L glute activation if perform R SLR first. VMO SLR x 10 performed first on the R LE and then the L LE Minisquats at rail x 15 All performed bil. No pain after tx. Pain monitored throughout.    10/21/24 Eval and HEP    PATIENT EDUCATION:  Education details: Patient educated on exam findings, POC, scope of PT, HEP, relevant anatomy and biomechanics. 11/11/24: HEP 11/18/24: HEP, exercise mechanics Person educated: Patient Education method: Explanation, Demonstration, and Handouts Education comprehension: verbalized understanding, returned demonstration, verbal cues required, and tactile cues required  HOME EXERCISE PROGRAM: Access Code: [email or web address] URL: [email or web address] Date: 10/21/2024 Prepared by: Prentice Zaunegger  Exercises - Supine Bridge  - 1 x daily - 7 x weekly - 2 sets - 10 reps - Sidelying Hip Abduction  - 1 x daily - 7 x weekly - 2 sets - 10 reps - Seated Long Arc Quad  - 1 x daily - 7 x weekly - 10 reps - 5-10 second hold  ASSESSMENT:  CLINICAL IMPRESSION: Patient is progressing well with PT intervention with improving function and reduction in symptoms. Pt continues to need cueing for proper LE alignment when performing therex but does exhibit good quality control. Pt has difficulty recruiting left hamstrings without compensation. Patient will continue to benefit from physical therapy in order to improve function and reduce impairment.   OBJECTIVE IMPAIRMENTS: Abnormal gait, decreased activity tolerance, decreased balance, decreased endurance, decreased mobility, difficulty walking, decreased ROM, decreased strength, increased muscle spasms, impaired flexibility, improper body mechanics, and pain  ACTIVITY LIMITATIONS: lifting, bending, standing, squatting, stairs,  transfers, locomotion level, and caring for others  PARTICIPATION LIMITATIONS: meal prep, cleaning, laundry, shopping, community activity, occupation, and yard work  PERSONAL FACTORS: Time since onset of injury/illness/exacerbation and 1-2 comorbidities: HTN, Right frontal meningoma resection 05/25/23 with LLE weaknes are also affecting patient's functional outcome.   REHAB POTENTIAL: Good  CLINICAL DECISION MAKING: Evolving/moderate complexity  EVALUATION COMPLEXITY: Moderate   GOALS: Goals reviewed with patient? Yes  SHORT TERM GOALS: Target date: 11/18/2024    Patient will be independent with HEP in order to improve functional outcomes. Baseline: Goal status: INITIAL  2.  Patient will report at least 25% improvement in symptoms for improved quality of life. Baseline: Goal status: INITIAL    LONG TERM GOALS: Target date: 12/16/2024    Patient will report at least 75% improvement in symptoms for improved quality of life. Baseline:  Goal status: INITIAL  2.  Patient will improve LEFS score by at least 10 points in order to indicate improved tolerance to activity. Baseline:  Goal status: INITIAL  3.  Patient will be able to navigate stairs with reciprocal pattern without compensation in order to demonstrate improved LE strength. Baseline:  Goal status: INITIAL  4.   Patient will demonstrate grade of 4+/5 MMT grade in all tested musculature as evidence of improved strength to assist with stair ambulation and gait.  Baseline:  Goal status: INITIAL  5.  Patient will be able to complete 5x STS in under 11.4 seconds in order to demonstrate improved functional strength. Baseline:  Goal status: INITIAL    PLAN:  PT FREQUENCY: 1-2x/week  PT DURATION: 8 weeks  PLANNED INTERVENTIONS: 97164- PT Re-evaluation, 97110-Therapeutic exercises, 97530- Therapeutic activity, [id number]- Neuromuscular re-education, 97535- Self Care, [id number]- Manual therapy, [phone]- Gait training, [phone]-  Orthotic Fit/training, [phone]- Canalith repositioning, [id number]- Aquatic Therapy, [phone]- Splinting, [phone]- Wound care (first 20 sq cm), 97598- Wound care (each additional 20 sq cm)Patient/Family education, Balance training, Stair training, Taping, Dry Needling, Joint mobilization, Joint manipulation, Spinal manipulation, Spinal mobilization, Scar mobilization, and DME instructions.  PLAN FOR NEXT SESSION: quad and glute strength, functional strength, hamstring strengthening bil L > R, RC due 12/16/24  Alger Ada, PT, DPT 12/02/2024, 9:23 AM  For all possible CPT codes, reference the Planned Interventions line above.     Check all conditions that are expected to impact treatment: {Conditions expected to impact treatment:None of these apply   If treatment provided at initial evaluation, no treatment charged due to lack of authorization.       "

## 2024-12-09 ENCOUNTER — Encounter (HOSPITAL_BASED_OUTPATIENT_CLINIC_OR_DEPARTMENT_OTHER): Payer: Self-pay | Admitting: Physical Therapy

## 2024-12-09 ENCOUNTER — Ambulatory Visit (HOSPITAL_BASED_OUTPATIENT_CLINIC_OR_DEPARTMENT_OTHER): Admitting: Physical Therapy

## 2024-12-09 DIAGNOSIS — R29898 Other symptoms and signs involving the musculoskeletal system: Secondary | ICD-10-CM

## 2024-12-09 DIAGNOSIS — M25561 Pain in right knee: Secondary | ICD-10-CM

## 2024-12-09 DIAGNOSIS — R2689 Other abnormalities of gait and mobility: Secondary | ICD-10-CM

## 2024-12-09 DIAGNOSIS — M6281 Muscle weakness (generalized): Secondary | ICD-10-CM

## 2024-12-09 DIAGNOSIS — R262 Difficulty in walking, not elsewhere classified: Secondary | ICD-10-CM

## 2024-12-09 NOTE — Therapy (Signed)
 " OUTPATIENT PHYSICAL THERAPY LOWER EXTREMITY TREATMENT   Patient Name: Alisha Thomas MRN: [id number] DOB:[birth year], 54 y.o., female Today's Date: 12/09/2024  END OF SESSION:  PT End of Session - 12/09/24 0837     Visit Number 6    Number of Visits 16    Date for Recertification  12/16/24    Authorization Type [place] MEDICAID [employer]    PT Start Time 0836    PT Stop Time 0914    PT Time Calculation (min) 38 min    Activity Tolerance Patient tolerated treatment well;No increased pain    Behavior During Therapy WFL for tasks assessed/performed            Past Medical History:  Diagnosis Date   Anemia    Hypertension    Ovarian cyst    Pelvic adhesions    Stomach ulcer    Past Surgical History:  Procedure Laterality Date   BRAIN TUMOR EXCISION  2024   BREAST CYST ASPIRATION Left 2021   ECTOPIC PREGNANCY SURGERY     OVARIAN CYST SURGERY     PELVIC LAPAROSCOPY  11/30/2006   Diag Lap lysis of adhesions   Patient Active Problem List   Diagnosis Date Noted   Symptomatic anemia 03/26/2020   Iron deficiency anemia due to chronic blood loss 02/24/2018   Fibroids 07/04/2015   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   Peptic ulcer 09/10/2011   Ovarian cyst    Pelvic adhesions     PCP: Vaughn Lauraine KATHEE DEVONNA  REFERRING PROVIDER: Irving Delinda ORN, MD  REFERRING DIAG: [phone] (ICD-10-CM) - Patellofemoral disorders, right knee M22.2X2 (ICD-10-CM) - Patellofemoral disorders, left knee  THERAPY DIAG:  Pain in both knees, unspecified chronicity  Muscle weakness (generalized)  Difficulty in walking, not elsewhere classified  Other abnormalities of gait and mobility  Other symptoms and signs involving the musculoskeletal system  Rationale for Evaluation and Treatment: Rehabilitation  ONSET DATE: chronic  SUBJECTIVE:   SUBJECTIVE STATEMENT: Has not had to use knee supports as much. Doing HEP. Feels PT has been helpful.   PERTINENT HISTORY: HTN, Right  frontal meningoma resection 05/25/23 with LLE weakness PAIN:  Are you having pain? No at rest  PRECAUTIONS: None  WEIGHT BEARING RESTRICTIONS: No  FALLS:  Has patient fallen in last 6 months? No  OCCUPATION: Substitute teaching  PLOF: Independent  PATIENT GOALS: strengthen legs, be strong and active   OBJECTIVE: (objective measures from initial evaluation unless otherwise dated)  PATIENT SURVEYS:  LEFS  Extreme difficulty/unable (0), Quite a bit of difficulty (1), Moderate difficulty (2), Little difficulty (3), No difficulty (4) Survey date:  10/21/24  Any of your usual work, housework or school activities 1  2. Usual hobbies, recreational or sporting activities 0  3. Getting into/out of the bath 4  4. Walking between rooms 4  5. Putting on socks/shoes 4  6. Squatting  0  7. Lifting an object, like a bag of groceries from the floor 1  8. Performing light activities around your home 4  9. Performing heavy activities around your home 0  10. Getting into/out of a car 4  11. Walking 2 blocks 4  12. Walking 1 mile 1  13. Going up/down 10 stairs (1 flight) 1  14. Standing for 1 hour 2  15.  sitting for 1 hour 4  16. Running on even ground 2  17. Running on uneven ground 1  18. Making sharp turns while running fast 0  19. Hopping  1  20.  Rolling over in bed 4  Score total:  42/80     COGNITION: Overall cognitive status: Within functional limits for tasks assessed     SENSATION: WFL    PALPATION: Patellar mobs WFL, no tenderness grossly R knee  LOWER EXTREMITY ROM:WFL for tasks assessed  Active ROM Right eval Left eval  Hip flexion    Hip extension    Hip abduction    Hip adduction    Hip internal rotation    Hip external rotation    Knee flexion    Knee extension    Ankle dorsiflexion    Ankle plantarflexion    Ankle inversion    Ankle eversion     (Blank rows = not tested) *= pain/symptoms  LOWER EXTREMITY MMT:  MMT Right eval Left eval  Hip  flexion 4+ 4+  Hip extension 4 4-  Hip abduction 4 4-  Hip adduction    Hip internal rotation    Hip external rotation    Knee flexion 5 4+*  Knee extension 4+* 4+  Ankle dorsiflexion    Ankle plantarflexion    Ankle inversion    Ankle eversion     (Blank rows = not tested) *= pain/symptoms   FUNCTIONAL TESTS:  5 times sit to stand: 14.16 seconds without UE support, mild valgus R>L which increases with reps Stairs: alternating pattern 7 inch, no UE support, trendelenberg bilaterally, valgus with descending R>L   GAIT: Distance walked: 100 feet Assistive device utilized: None Level of assistance: Complete Independence Comments: trendelenburg bilaterally   TODAY'S TREATMENT:                                                                                                                              DATE:  12/09/24 Elliptical 5 minutes for dynamic warm up Lateral step down 6 inch 2 x 10 Forward step down 6 inch 2 x 10 Curtsy step up  6 inch 2 x 10 RDL 10# kb 2 x 10  SL RDL 1 x 10  12/02/24 NuStep level 6 Les only x 5 min with PT discussing pain Wall slides x 20 with concentration on neutral LE alignment Static lunge at sink x 15 Airex Hamstring curl sinkside 3 lbs x 20 Airex hip abduction with knee flexed 3 lbs x 20 with PT verbal and tactile cues for technique and to decrease cadence Prone 3 lb hamstring curl x 20 Prone hip ext 3 lbs x 20 with difficulty performing on LLE with need for PT verbal and tactile cues for technique to decrease compensations Prayer stretch 2x30 sec  11/18/24 Bike 5 minutes for dynamic warm up Squat chair taps 3 x 10 Step up 6 inch with contralateral knee drive 2 x 10 Lateral step down 6 inch 2 x 10 Lateral stepping in mini squat GTB at knees 6 x 15 feet Standing hip abduction GTB at knees 1 x 10 RDL 5# kb 3 x 10   11/11/24  LAQ 3# 10 x 5-10 second holds Squat chair taps 2 x 10 Step up 6 inch 2 x 10 Lateral step up 6 inch 2 x 10 Lateral  stepping in mini squat 4 x 15 feet Standing hip abduction GTB at knees 3 x 10 RDL 5# kb 3 x 10   [facility] Adult PT Treatment:                                                DATE: 11/04/24 Therapeutic Exercise: Review HEP Prone hip extension x 15 Prone hamstring curl x 15 with 2 lb weight Prone 2 lb hamstring curl x 15 3 lb SAQ x 15 unilat and bil SLR x 15 with concentration on quad contraction with contraction being easier on the R than L with L trying to substitute with glute activation. Less L glute activation if perform R SLR first. VMO SLR x 10 performed first on the R LE and then the L LE Minisquats at rail x 15 All performed bil. No pain after tx. Pain monitored throughout.    10/21/24 Eval and HEP    PATIENT EDUCATION:  Education details: Patient educated on exam findings, POC, scope of PT, HEP, relevant anatomy and biomechanics. 11/11/24: HEP 11/18/24: HEP, exercise mechanics Person educated: Patient Education method: Explanation, Demonstration, and Handouts Education comprehension: verbalized understanding, returned demonstration, verbal cues required, and tactile cues required  HOME EXERCISE PROGRAM: Access Code: [email or web address] URL: [email or web address] Date: 10/21/2024 Prepared by: Prentice Jaiah Weigel  Exercises - Supine Bridge  - 1 x daily - 7 x weekly - 2 sets - 10 reps - Sidelying Hip Abduction  - 1 x daily - 7 x weekly - 2 sets - 10 reps - Seated Long Arc Quad  - 1 x daily - 7 x weekly - 10 reps - 5-10 second hold  ASSESSMENT:  CLINICAL IMPRESSION: Began session on elliptical for dynamic warm up, mild valgus when performing. Cueing for glute activation and reducing valgus with functional strengthening with good carry over. She continues to progress well. Discussed POC and will reassess next session for possible extension of POC.  Patient will continue to benefit from physical therapy in order to improve function and reduce impairment.   OBJECTIVE  IMPAIRMENTS: Abnormal gait, decreased activity tolerance, decreased balance, decreased endurance, decreased mobility, difficulty walking, decreased ROM, decreased strength, increased muscle spasms, impaired flexibility, improper body mechanics, and pain  ACTIVITY LIMITATIONS: lifting, bending, standing, squatting, stairs, transfers, locomotion level, and caring for others  PARTICIPATION LIMITATIONS: meal prep, cleaning, laundry, shopping, community activity, occupation, and yard work  PERSONAL FACTORS: Time since onset of injury/illness/exacerbation and 1-2 comorbidities: HTN, Right frontal meningoma resection 05/25/23 with LLE weaknes are also affecting patient's functional outcome.   REHAB POTENTIAL: Good  CLINICAL DECISION MAKING: Evolving/moderate complexity  EVALUATION COMPLEXITY: Moderate   GOALS: Goals reviewed with patient? Yes  SHORT TERM GOALS: Target date: 11/18/2024    Patient will be independent with HEP in order to improve functional outcomes. Baseline: Goal status: INITIAL  2.  Patient will report at least 25% improvement in symptoms for improved quality of life. Baseline: Goal status: INITIAL    LONG TERM GOALS: Target date: 12/16/2024    Patient will report at least 75% improvement in symptoms for improved quality of life. Baseline:  Goal status: INITIAL  2.  Patient will improve LEFS score by at  least 10 points in order to indicate improved tolerance to activity. Baseline:  Goal status: INITIAL  3.  Patient will be able to navigate stairs with reciprocal pattern without compensation in order to demonstrate improved LE strength. Baseline:  Goal status: INITIAL  4.   Patient will demonstrate grade of 4+/5 MMT grade in all tested musculature as evidence of improved strength to assist with stair ambulation and gait.  Baseline:  Goal status: INITIAL  5.  Patient will be able to complete 5x STS in under 11.4 seconds in order to demonstrate improved  functional strength. Baseline:  Goal status: INITIAL    PLAN:  PT FREQUENCY: 1-2x/week  PT DURATION: 8 weeks  PLANNED INTERVENTIONS: 97164- PT Re-evaluation, 97110-Therapeutic exercises, 97530- Therapeutic activity, [id number]- Neuromuscular re-education, 97535- Self Care, 02859- Manual therapy, [id number]- Gait training, [phone]- Orthotic Fit/training, [phone]- Canalith repositioning, [id number]- Aquatic Therapy, [phone]- Splinting, [phone]- Wound care (first 20 sq cm), 97598- Wound care (each additional 20 sq cm)Patient/Family education, Balance training, Stair training, Taping, Dry Needling, Joint mobilization, Joint manipulation, Spinal manipulation, Spinal mobilization, Scar mobilization, and DME instructions.  PLAN FOR NEXT SESSION: quad and glute strength, functional strength, hamstring strengthening bil L > R, RC due 12/16/24  Prentice GORMAN Stains, PT, DPT 12/09/2024, 9:11 AM  For all possible CPT codes, reference the Planned Interventions line above.     Check all conditions that are expected to impact treatment: {Conditions expected to impact treatment:None of these apply   If treatment provided at initial evaluation, no treatment charged due to lack of authorization.       "

## 2024-12-16 ENCOUNTER — Encounter (HOSPITAL_BASED_OUTPATIENT_CLINIC_OR_DEPARTMENT_OTHER): Payer: Self-pay | Admitting: Physical Therapy

## 2024-12-16 ENCOUNTER — Ambulatory Visit (HOSPITAL_BASED_OUTPATIENT_CLINIC_OR_DEPARTMENT_OTHER): Admitting: Physical Therapy

## 2024-12-16 DIAGNOSIS — M25561 Pain in right knee: Secondary | ICD-10-CM

## 2024-12-16 DIAGNOSIS — M6281 Muscle weakness (generalized): Secondary | ICD-10-CM

## 2024-12-16 DIAGNOSIS — R29898 Other symptoms and signs involving the musculoskeletal system: Secondary | ICD-10-CM

## 2024-12-16 DIAGNOSIS — R2689 Other abnormalities of gait and mobility: Secondary | ICD-10-CM

## 2024-12-16 DIAGNOSIS — R262 Difficulty in walking, not elsewhere classified: Secondary | ICD-10-CM

## 2024-12-16 NOTE — Therapy (Signed)
 " OUTPATIENT PHYSICAL THERAPY LOWER EXTREMITY TREATMENT   Patient Name: Alisha Thomas MRN: [id number] DOB:November 23, 1971, 54 y.o., female Today's Date: 12/16/2024 Progress Note   Reporting Period 10/21/25 to 12/16/24   See note below for Objective Data and Assessment of Progress/Goals   END OF SESSION:  PT End of Session - 12/16/24 0910     Visit Number 7    Number of Visits 16    Date for Recertification  12/16/24    Authorization Type [place] MEDICAID [employer]    PT Start Time 0910    PT Stop Time 0950    PT Time Calculation (min) 40 min    Activity Tolerance Patient tolerated treatment well;No increased pain    Behavior During Therapy WFL for tasks assessed/performed            Past Medical History:  Diagnosis Date   Anemia    Hypertension    Ovarian cyst    Pelvic adhesions    Stomach ulcer    Past Surgical History:  Procedure Laterality Date   BRAIN TUMOR EXCISION  2024   BREAST CYST ASPIRATION Left 2021   ECTOPIC PREGNANCY SURGERY     OVARIAN CYST SURGERY     PELVIC LAPAROSCOPY  11/30/2006   Diag Lap lysis of adhesions   Patient Active Problem List   Diagnosis Date Noted   Symptomatic anemia 03/26/2020   Iron deficiency anemia due to chronic blood loss 02/24/2018   Fibroids 07/04/2015   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   Peptic ulcer 09/10/2011   Ovarian cyst    Pelvic adhesions     PCP: Vaughn Lauraine KATHEE DEVONNA  REFERRING PROVIDER: Irving Delinda ORN, MD  REFERRING DIAG: [phone] (ICD-10-CM) - Patellofemoral disorders, right knee M22.2X2 (ICD-10-CM) - Patellofemoral disorders, left knee  THERAPY DIAG:  Pain in both knees, unspecified chronicity  Muscle weakness (generalized)  Difficulty in walking, not elsewhere classified  Other abnormalities of gait and mobility  Other symptoms and signs involving the musculoskeletal system  Rationale for Evaluation and Treatment: Rehabilitation  ONSET DATE: chronic  SUBJECTIVE:   SUBJECTIVE  STATEMENT: Patient states doing well. She is going to the gym and doing HEP. Patient states 85% functional status/improvement. Cold weather makes left leg feel worse. Remaining deficit is some soreness and weakness. Feels like she needs more help with PT. Trouble getting off the floor still. Feels there is an issue in calf/hamstring still mostly on L.  PERTINENT HISTORY: HTN, Right frontal meningoma resection 05/25/23 with LLE weakness PAIN:  Are you having pain? No at rest  PRECAUTIONS: None  WEIGHT BEARING RESTRICTIONS: No  FALLS:  Has patient fallen in last 6 months? No  OCCUPATION: Substitute teaching  PLOF: Independent  PATIENT GOALS: strengthen legs, be strong and active   OBJECTIVE: (objective measures from initial evaluation unless otherwise dated)  PATIENT SURVEYS:  LEFS  Extreme difficulty/unable (0), Quite a bit of difficulty (1), Moderate difficulty (2), Little difficulty (3), No difficulty (4) Survey date:  10/21/24 12/16/24  Any of your usual work, housework or school activities 1 3  2. Usual hobbies, recreational or sporting activities 0 3  3. Getting into/out of the bath 4 4  4. Walking between rooms 4 4  5. Putting on socks/shoes 4 4  6. Squatting  0 3  7. Lifting an object, like a bag of groceries from the floor 1 4  8. Performing light activities around your home 4 4  9. Performing heavy activities around your home 0 3  10.  Getting into/out of a car 4 4  11. Walking 2 blocks 4 4  12. Walking 1 mile 1 3  13. Going up/down 10 stairs (1 flight) 1 3  14. Standing for 1 hour 2 3  15.  sitting for 1 hour 4 3  16. Running on even ground 2 3  17. Running on uneven ground 1 3  18. Making sharp turns while running fast 0 3  19. Hopping  1 3  20. Rolling over in bed 4 4  Score total:  42/80 68/80     COGNITION: Overall cognitive status: Within functional limits for tasks assessed     SENSATION: WFL    PALPATION: Patellar mobs WFL, no tenderness  grossly R knee  LOWER EXTREMITY ROM:WFL for tasks assessed  Active ROM Right eval Left eval  Hip flexion    Hip extension    Hip abduction    Hip adduction    Hip internal rotation    Hip external rotation    Knee flexion    Knee extension    Ankle dorsiflexion    Ankle plantarflexion    Ankle inversion    Ankle eversion     (Blank rows = not tested) *= pain/symptoms  LOWER EXTREMITY MMT:  MMT Right eval Left eval Right 12/16/24 Left 12/16/24  Hip flexion 4+ 4+ 5 5  Hip extension 4 4- 4+ 4+  Hip abduction 4 4- 4+ 4+  Hip adduction      Hip internal rotation      Hip external rotation      Knee flexion 5 4+* 5 5  Knee extension 4+* 4+ 5 5  Ankle dorsiflexion   5 5  Ankle plantarflexion      Ankle inversion      Ankle eversion       (Blank rows = not tested) *= pain/symptoms   FUNCTIONAL TESTS:  5 times sit to stand: 14.16 seconds without UE support, mild valgus R>L which increases with reps Stairs: alternating pattern 7 inch, no UE support, trendelenberg bilaterally, valgus with descending R>L     GAIT: Distance walked: 100 feet Assistive device utilized: None Level of assistance: Complete Independence Comments: trendelenburg bilaterally   Reassessment 12/16/24: 5 times sit to stand: 11.4 seconds without UE support, very mild valgus R>L, relies slightly LLE>RLE Stairs: alternating pattern 7 inch, no UE support, mildly decreased eccentric strength  TODAY'S TREATMENT:                                                                                                                              DATE:  12/16/24 Elliptical 5 minutes for dynamic warm up Reassessment  Discussion of objective findings and POC SL RDL 2 x 10 Lunge 1 x 10  12/09/24 Elliptical 5 minutes for dynamic warm up Lateral step down 6 inch 2 x 10 Forward step down 6 inch 2 x 10 Curtsy step up  6 inch 2 x 10  RDL 10# kb 2 x 10  SL RDL 1 x 10  12/02/24 NuStep level 6 Les only x 5 min with  PT discussing pain Wall slides x 20 with concentration on neutral LE alignment Static lunge at sink x 15 Airex Hamstring curl sinkside 3 lbs x 20 Airex hip abduction with knee flexed 3 lbs x 20 with PT verbal and tactile cues for technique and to decrease cadence Prone 3 lb hamstring curl x 20 Prone hip ext 3 lbs x 20 with difficulty performing on LLE with need for PT verbal and tactile cues for technique to decrease compensations Prayer stretch 2x30 sec  11/18/24 Bike 5 minutes for dynamic warm up Squat chair taps 3 x 10 Step up 6 inch with contralateral knee drive 2 x 10 Lateral step down 6 inch 2 x 10 Lateral stepping in mini squat GTB at knees 6 x 15 feet Standing hip abduction GTB at knees 1 x 10 RDL 5# kb 3 x 10   11/11/24 LAQ 3# 10 x 5-10 second holds Squat chair taps 2 x 10 Step up 6 inch 2 x 10 Lateral step up 6 inch 2 x 10 Lateral stepping in mini squat 4 x 15 feet Standing hip abduction GTB at knees 3 x 10 RDL 5# kb 3 x 10   [facility] Adult PT Treatment:                                                DATE: 11/04/24 Therapeutic Exercise: Review HEP Prone hip extension x 15 Prone hamstring curl x 15 with 2 lb weight Prone 2 lb hamstring curl x 15 3 lb SAQ x 15 unilat and bil SLR x 15 with concentration on quad contraction with contraction being easier on the R than L with L trying to substitute with glute activation. Less L glute activation if perform R SLR first. VMO SLR x 10 performed first on the R LE and then the L LE Minisquats at rail x 15 All performed bil. No pain after tx. Pain monitored throughout.    10/21/24 Eval and HEP    PATIENT EDUCATION:  Education details: Patient educated on exam findings, POC, scope of PT, HEP, relevant anatomy and biomechanics. 11/11/24: HEP 11/18/24: HEP, exercise mechanics 12/16/24: HEP, exercise mechanics, reassessment findings, POC Person educated: Patient Education method: Explanation, Demonstration, and Handouts Education  comprehension: verbalized understanding, returned demonstration, verbal cues required, and tactile cues required  HOME EXERCISE PROGRAM: Access Code: [email or web address] URL: [email or web address] Date: 10/21/2024 Prepared by: Prentice Libi Corso  Exercises - Supine Bridge  - 1 x daily - 7 x weekly - 2 sets - 10 reps - Sidelying Hip Abduction  - 1 x daily - 7 x weekly - 2 sets - 10 reps - Seated Long Arc Quad  - 1 x daily - 7 x weekly - 10 reps - 5-10 second hold  ASSESSMENT:  CLINICAL IMPRESSION: Began session on elliptical for dynamic warm up. Patient has met 2/2 short term goals and 4/5 long term goals with ability to complete HEP and improvement in symptoms, strength, ROM, activity tolerance, gait, balance, and functional mobility. Remaining goals not met due to continued deficits in symptoms, strength, and functional mobility. Patient has made great progress toward remaining goals and is overall doing very well. Extending POC 1x/week for 6 weeks to work toward remaining deficit. New  goal added for functional strength/mobility. Patient will continue to benefit from skilled physical therapy in order to improve function and reduce impairment.     OBJECTIVE IMPAIRMENTS: Abnormal gait, decreased activity tolerance, decreased balance, decreased endurance, decreased mobility, difficulty walking, decreased ROM, decreased strength, increased muscle spasms, impaired flexibility, improper body mechanics, and pain  ACTIVITY LIMITATIONS: lifting, bending, standing, squatting, stairs, transfers, locomotion level, and caring for others  PARTICIPATION LIMITATIONS: meal prep, cleaning, laundry, shopping, community activity, occupation, and yard work  PERSONAL FACTORS: Time since onset of injury/illness/exacerbation and 1-2 comorbidities: HTN, Right frontal meningoma resection 05/25/23 with LLE weaknes are also affecting patient's functional outcome.   REHAB POTENTIAL: Good  CLINICAL DECISION MAKING:  Evolving/moderate complexity  EVALUATION COMPLEXITY: Moderate   GOALS: Goals reviewed with patient? Yes  SHORT TERM GOALS: Target date: 11/18/2024    Patient will be independent with HEP in order to improve functional outcomes. Baseline: Goal status: MET  2.  Patient will report at least 25% improvement in symptoms for improved quality of life. Baseline: Goal status: MET    LONG TERM GOALS: Target date: 12/16/2024    Patient will report at least 75% improvement in symptoms for improved quality of life. Baseline:  Goal status: MET  2.  Patient will improve LEFS score by at least 10 points in order to indicate improved tolerance to activity. Baseline:  Goal status: MET  3.  Patient will be able to navigate stairs with reciprocal pattern without compensation in order to demonstrate improved LE strength. Baseline:  Goal status: INITIAL  4.   Patient will demonstrate grade of 4+/5 MMT grade in all tested musculature as evidence of improved strength to assist with stair ambulation and gait.  Baseline:  Goal status: MET  5.  Patient will be able to complete 5x STS in under 11.4 seconds in order to demonstrate improved functional strength. Baseline:  Goal status: MET 6.  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 Baseline:  Goal status: Initial    PLAN:  PT FREQUENCY: 1x/week  PT DURATION: 6 weeks  PLANNED INTERVENTIONS: 97164- PT Re-evaluation, 97110-Therapeutic exercises, 97530- Therapeutic activity, [id number]- Neuromuscular re-education, 97535- Self Care, [id number]- Manual therapy, [id number]- Gait training, [phone]- Orthotic Fit/training, [phone]- Canalith repositioning, [id number]- Aquatic Therapy, [phone]- Splinting, [phone]- Wound care (first 20 sq cm), 97598- Wound care (each additional 20 sq cm)Patient/Family education, Balance training, Stair training, Taping, Dry Needling, Joint mobilization, Joint manipulation,  Spinal manipulation, Spinal mobilization, Scar mobilization, and DME instructions.  PLAN FOR NEXT SESSION: quad and glute strength, functional strength, hamstring strengthening   Prentice GORMAN Stains, PT, DPT 12/16/2024, 9:11 AM  For all possible CPT codes, reference the Planned Interventions line above.     Check all conditions that are expected to impact treatment: {Conditions expected to impact treatment:None of these apply   If treatment provided at initial evaluation, no treatment charged due to lack of authorization.       "

## 2024-12-23 ENCOUNTER — Encounter (HOSPITAL_BASED_OUTPATIENT_CLINIC_OR_DEPARTMENT_OTHER): Payer: Self-pay | Admitting: Physical Therapy

## 2024-12-23 ENCOUNTER — Ambulatory Visit (HOSPITAL_BASED_OUTPATIENT_CLINIC_OR_DEPARTMENT_OTHER): Admitting: Physical Therapy

## 2024-12-23 DIAGNOSIS — R262 Difficulty in walking, not elsewhere classified: Secondary | ICD-10-CM

## 2024-12-23 DIAGNOSIS — M25561 Pain in right knee: Secondary | ICD-10-CM

## 2024-12-23 DIAGNOSIS — R29898 Other symptoms and signs involving the musculoskeletal system: Secondary | ICD-10-CM

## 2024-12-23 DIAGNOSIS — R2689 Other abnormalities of gait and mobility: Secondary | ICD-10-CM

## 2024-12-23 DIAGNOSIS — M6281 Muscle weakness (generalized): Secondary | ICD-10-CM

## 2024-12-23 NOTE — Therapy (Signed)
 " OUTPATIENT PHYSICAL THERAPY LOWER EXTREMITY TREATMENT   Patient Name: Alisha Thomas MRN: [id number] DOB:[birth year], 54 y.o., female Today's Date: 12/23/2024   END OF SESSION:  PT End of Session - 12/23/24 0918     Visit Number 8    Number of Visits 24    Date for Recertification  01/27/25    Authorization Type [place] MEDICAID [employer]    PT Start Time 0915    PT Stop Time 0955    PT Time Calculation (min) 40 min    Activity Tolerance Patient tolerated treatment well;No increased pain    Behavior During Therapy WFL for tasks assessed/performed            Past Medical History:  Diagnosis Date   Anemia    Hypertension    Ovarian cyst    Pelvic adhesions    Stomach ulcer    Past Surgical History:  Procedure Laterality Date   BRAIN TUMOR EXCISION  2024   BREAST CYST ASPIRATION Left 2021   ECTOPIC PREGNANCY SURGERY     OVARIAN CYST SURGERY     PELVIC LAPAROSCOPY  11/30/2006   Diag Lap lysis of adhesions   Patient Active Problem List   Diagnosis Date Noted   Symptomatic anemia 03/26/2020   Iron deficiency anemia due to chronic blood loss 02/24/2018   Fibroids 07/04/2015   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   Peptic ulcer 09/10/2011   Ovarian cyst    Pelvic adhesions     PCP: Vaughn Lauraine KATHEE DEVONNA  REFERRING PROVIDER: Irving Delinda ORN, MD  REFERRING DIAG: [phone] (ICD-10-CM) - Patellofemoral disorders, right knee M22.2X2 (ICD-10-CM) - Patellofemoral disorders, left knee  THERAPY DIAG:  Pain in both knees, unspecified chronicity  Muscle weakness (generalized)  Other abnormalities of gait and mobility  Other symptoms and signs involving the musculoskeletal system  Difficulty in walking, not elsewhere classified  Rationale for Evaluation and Treatment: Rehabilitation  ONSET DATE: chronic  SUBJECTIVE:   SUBJECTIVE STATEMENT: Patient states doing well with HEP and coming to gym.   PERTINENT HISTORY: HTN, Right frontal meningoma  resection 05/25/23 with LLE weakness PAIN:  Are you having pain? No at rest  PRECAUTIONS: None  WEIGHT BEARING RESTRICTIONS: No  FALLS:  Has patient fallen in last 6 months? No  OCCUPATION: Substitute teaching  PLOF: Independent  PATIENT GOALS: strengthen legs, be strong and active   OBJECTIVE: (objective measures from initial evaluation unless otherwise dated)  PATIENT SURVEYS:  LEFS  Extreme difficulty/unable (0), Quite a bit of difficulty (1), Moderate difficulty (2), Little difficulty (3), No difficulty (4) Survey date:  10/21/24 12/16/24  Any of your usual work, housework or school activities 1 3  2. Usual hobbies, recreational or sporting activities 0 3  3. Getting into/out of the bath 4 4  4. Walking between rooms 4 4  5. Putting on socks/shoes 4 4  6. Squatting  0 3  7. Lifting an object, like a bag of groceries from the floor 1 4  8. Performing light activities around your home 4 4  9. Performing heavy activities around your home 0 3  10. Getting into/out of a car 4 4  11. Walking 2 blocks 4 4  12. Walking 1 mile 1 3  13. Going up/down 10 stairs (1 flight) 1 3  14. Standing for 1 hour 2 3  15.  sitting for 1 hour 4 3  16. Running on even ground 2 3  17. Running on uneven ground 1 3  18. Making  sharp turns while running fast 0 3  19. Hopping  1 3  20. Rolling over in bed 4 4  Score total:  42/80 68/80     COGNITION: Overall cognitive status: Within functional limits for tasks assessed     SENSATION: WFL    PALPATION: Patellar mobs WFL, no tenderness grossly R knee  LOWER EXTREMITY ROM:WFL for tasks assessed  Active ROM Right eval Left eval  Hip flexion    Hip extension    Hip abduction    Hip adduction    Hip internal rotation    Hip external rotation    Knee flexion    Knee extension    Ankle dorsiflexion    Ankle plantarflexion    Ankle inversion    Ankle eversion     (Blank rows = not tested) *= pain/symptoms  LOWER EXTREMITY  MMT:  MMT Right eval Left eval Right 12/16/24 Left 12/16/24  Hip flexion 4+ 4+ 5 5  Hip extension 4 4- 4+ 4+  Hip abduction 4 4- 4+ 4+  Hip adduction      Hip internal rotation      Hip external rotation      Knee flexion 5 4+* 5 5  Knee extension 4+* 4+ 5 5  Ankle dorsiflexion   5 5  Ankle plantarflexion      Ankle inversion      Ankle eversion       (Blank rows = not tested) *= pain/symptoms   FUNCTIONAL TESTS:  5 times sit to stand: 14.16 seconds without UE support, mild valgus R>L which increases with reps Stairs: alternating pattern 7 inch, no UE support, trendelenberg bilaterally, valgus with descending R>L     GAIT: Distance walked: 100 feet Assistive device utilized: None Level of assistance: Complete Independence Comments: trendelenburg bilaterally   Reassessment 12/16/24: 5 times sit to stand: 11.4 seconds without UE support, very mild valgus R>L, relies slightly LLE>RLE Stairs: alternating pattern 7 inch, no UE support, mildly decreased eccentric strength  TODAY'S TREATMENT:                                                                                                                              DATE:  12/23/24 Elliptical 5 minutes for dynamic warm up Squat chair tap 10# 3 x 10 Forward step down 6 inch 2 x 10 Lunge 2 x 10 RDL 10# 3 x 10 SL RDL 3 x 10 Floor to stand transfers (lunge from airex) 1 x 5  12/16/24 Elliptical 5 minutes for dynamic warm up Reassessment  Discussion of objective findings and POC SL RDL 2 x 10 Lunge 1 x 10  12/09/24 Elliptical 5 minutes for dynamic warm up Lateral step down 6 inch 2 x 10 Forward step down 6 inch 2 x 10 Curtsy step up  6 inch 2 x 10 RDL 10# kb 2 x 10  SL RDL 1 x 10  12/02/24 NuStep level 6 Les only  x 5 min with PT discussing pain Wall slides x 20 with concentration on neutral LE alignment Static lunge at sink x 15 Airex Hamstring curl sinkside 3 lbs x 20 Airex hip abduction with knee flexed 3 lbs x 20  with PT verbal and tactile cues for technique and to decrease cadence Prone 3 lb hamstring curl x 20 Prone hip ext 3 lbs x 20 with difficulty performing on LLE with need for PT verbal and tactile cues for technique to decrease compensations Prayer stretch 2x30 sec  11/18/24 Bike 5 minutes for dynamic warm up Squat chair taps 3 x 10 Step up 6 inch with contralateral knee drive 2 x 10 Lateral step down 6 inch 2 x 10 Lateral stepping in mini squat GTB at knees 6 x 15 feet Standing hip abduction GTB at knees 1 x 10 RDL 5# kb 3 x 10   11/11/24 LAQ 3# 10 x 5-10 second holds Squat chair taps 2 x 10 Step up 6 inch 2 x 10 Lateral step up 6 inch 2 x 10 Lateral stepping in mini squat 4 x 15 feet Standing hip abduction GTB at knees 3 x 10 RDL 5# kb 3 x 10     PATIENT EDUCATION:  Education details: Patient educated on exam findings, POC, scope of PT, HEP, relevant anatomy and biomechanics. 11/11/24: HEP 11/18/24: HEP, exercise mechanics 12/16/24: HEP, exercise mechanics, reassessment findings, POC Person educated: Patient Education method: Explanation, Demonstration, and Handouts Education comprehension: verbalized understanding, returned demonstration, verbal cues required, and tactile cues required  HOME EXERCISE PROGRAM: Access Code: [email or web address] URL: [email or web address] Date: 10/21/2024 Prepared by: Prentice Loran Fleet  Exercises - Supine Bridge  - 1 x daily - 7 x weekly - 2 sets - 10 reps - Sidelying Hip Abduction  - 1 x daily - 7 x weekly - 2 sets - 10 reps - Seated Long Arc Quad  - 1 x daily - 7 x weekly - 10 reps - 5-10 second hold  ASSESSMENT:  CLINICAL IMPRESSION: Began session on elliptical for dynamic warm up. Continued with glute/quad and functional strengthening which is tolerated well. Intermittent cueing for mechanics with good carry over. Moderate fatigue at EOS Patient will continue to benefit from skilled physical therapy in order to improve function and  reduce impairment.     OBJECTIVE IMPAIRMENTS: Abnormal gait, decreased activity tolerance, decreased balance, decreased endurance, decreased mobility, difficulty walking, decreased ROM, decreased strength, increased muscle spasms, impaired flexibility, improper body mechanics, and pain  ACTIVITY LIMITATIONS: lifting, bending, standing, squatting, stairs, transfers, locomotion level, and caring for others  PARTICIPATION LIMITATIONS: meal prep, cleaning, laundry, shopping, community activity, occupation, and yard work  PERSONAL FACTORS: Time since onset of injury/illness/exacerbation and 1-2 comorbidities: HTN, Right frontal meningoma resection 05/25/23 with LLE weaknes are also affecting patient's functional outcome.   REHAB POTENTIAL: Good  CLINICAL DECISION MAKING: Evolving/moderate complexity  EVALUATION COMPLEXITY: Moderate   GOALS: Goals reviewed with patient? Yes  SHORT TERM GOALS: Target date: 11/18/2024    Patient will be independent with HEP in order to improve functional outcomes. Baseline: Goal status: MET  2.  Patient will report at least 25% improvement in symptoms for improved quality of life. Baseline: Goal status: MET    LONG TERM GOALS: Target date: 12/16/2024    Patient will report at least 75% improvement in symptoms for improved quality of life. Baseline:  Goal status: MET  2.  Patient will improve LEFS score by at least 10 points in order to indicate improved tolerance  to activity. Baseline:  Goal status: MET  3.  Patient will be able to navigate stairs with reciprocal pattern without compensation in order to demonstrate improved LE strength. Baseline:  Goal status: INITIAL  4.   Patient will demonstrate grade of 4+/5 MMT grade in all tested musculature as evidence of improved strength to assist with stair ambulation and gait.  Baseline:  Goal status: MET  5.  Patient will be able to complete 5x STS in under 11.4 seconds in order to demonstrate  improved functional strength. Baseline:  Goal status: MET 6.  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 Baseline:  Goal status: Initial    PLAN:  PT FREQUENCY: 1x/week  PT DURATION: 6 weeks  PLANNED INTERVENTIONS: 97164- PT Re-evaluation, 97110-Therapeutic exercises, 97530- Therapeutic activity, [id number]- Neuromuscular re-education, 97535- Self Care, 02859- Manual therapy, [id number]- Gait training, [phone]- Orthotic Fit/training, [phone]- Canalith repositioning, [id number]- Aquatic Therapy, [phone]- Splinting, [phone]- Wound care (first 20 sq cm), 97598- Wound care (each additional 20 sq cm)Patient/Family education, Balance training, Stair training, Taping, Dry Needling, Joint mobilization, Joint manipulation, Spinal manipulation, Spinal mobilization, Scar mobilization, and DME instructions.  PLAN FOR NEXT SESSION: quad and glute strength, functional strength, hamstring strengthening   Prentice GORMAN Stains, PT, DPT 12/23/2024, 9:19 AM  For all possible CPT codes, reference the Planned Interventions line above.     Check all conditions that are expected to impact treatment: {Conditions expected to impact treatment:None of these apply   If treatment provided at initial evaluation, no treatment charged due to lack of authorization.       "

## 2024-12-30 ENCOUNTER — Ambulatory Visit (HOSPITAL_BASED_OUTPATIENT_CLINIC_OR_DEPARTMENT_OTHER): Admitting: Physical Therapy

## 2024-12-30 ENCOUNTER — Encounter (HOSPITAL_BASED_OUTPATIENT_CLINIC_OR_DEPARTMENT_OTHER): Payer: Self-pay | Admitting: Physical Therapy

## 2024-12-30 DIAGNOSIS — M6281 Muscle weakness (generalized): Secondary | ICD-10-CM

## 2024-12-30 DIAGNOSIS — R29898 Other symptoms and signs involving the musculoskeletal system: Secondary | ICD-10-CM

## 2024-12-30 DIAGNOSIS — R2689 Other abnormalities of gait and mobility: Secondary | ICD-10-CM

## 2024-12-30 DIAGNOSIS — M25561 Pain in right knee: Secondary | ICD-10-CM

## 2024-12-30 NOTE — Therapy (Signed)
 " OUTPATIENT PHYSICAL THERAPY LOWER EXTREMITY TREATMENT   Patient Name: Alisha Thomas MRN: [id number] DOB:1971/07/25, 54 y.o., female Today's Date: 12/30/2024   END OF SESSION:  PT End of Session - 12/30/24 0746     Visit Number 9    Number of Visits 24    Date for Recertification  01/27/25    Authorization Type [place] [employer]ICA[employer]    PT Start Time 0746    PT Stop Time 0828    PT Time Calculation (min) 42 min    Activity Tolerance Patient tolerated treatment well;No increased pain    Behavior During Therapy WFL for tasks assessed/performed            Past Medical History:  Diagnosis Date   Anemia    Hypertension    Ovarian cyst    Pelvic adhesions    Stomach ulcer    Past Surgical History:  Procedure Laterality Date   BRAIN TUMOR EXCISION  2024   BREAST CYST ASPIRATION Left 2021   ECTOPIC PREGNANCY SURGERY     OVARIAN CYST SURGERY     PELVIC LAPAROSCOPY  11/30/2006   Diag Lap lysis of adhesions   Patient Active Problem List   Diagnosis Date Noted   Symptomatic anemia 03/26/2020   Iron deficiency anemia due to chronic blood loss 02/24/2018   Fibroids 07/04/2015   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   Peptic ulcer 09/10/2011   Ovarian cyst    Pelvic adhesions     PCP: Vaughn Lauraine KATHEE DEVONNA  REFERRING PROVIDER: Irving Delinda ORN, MD  REFERRING DIAG: [phone] (ICD-10-CM) - Patellofemoral disorders, right knee M22.2X2 (ICD-10-CM) - Patellofemoral disorders, left knee  THERAPY DIAG:  Pain in both knees, unspecified chronicity  Muscle weakness (generalized)  Other abnormalities of gait and mobility  Other symptoms and signs involving the musculoskeletal system  Rationale for Evaluation and Treatment: Rehabilitation  ONSET DATE: chronic  SUBJECTIVE:   SUBJECTIVE STATEMENT: Patient states felt alright after last time. Did come work out 3 x last week.  PERTINENT HISTORY: HTN, Right frontal meningoma resection 05/25/23 with LLE  weakness PAIN:  Are you having pain? No at rest  PRECAUTIONS: None  WEIGHT BEARING RESTRICTIONS: No  FALLS:  Has patient fallen in last 6 months? No  OCCUPATION: Substitute teaching  PLOF: Independent  PATIENT GOALS: strengthen legs, be strong and active   OBJECTIVE: (objective measures from initial evaluation unless otherwise dated)  PATIENT SURVEYS:  LEFS  Extreme difficulty/unable (0), Quite a bit of difficulty (1), Moderate difficulty (2), Little difficulty (3), No difficulty (4) Survey date:  10/21/24 12/16/24  Any of your usual work, housework or school activities 1 3  2. Usual hobbies, recreational or sporting activities 0 3  3. Getting into/out of the bath 4 4  4. Walking between rooms 4 4  5. Putting on socks/shoes 4 4  6. Squatting  0 3  7. Lifting an object, like a bag of groceries from the floor 1 4  8. Performing light activities around your home 4 4  9. Performing heavy activities around your home 0 3  10. Getting into/out of a car 4 4  11. Walking 2 blocks 4 4  12. Walking 1 mile 1 3  13. Going up/down 10 stairs (1 flight) 1 3  14. Standing for 1 hour 2 3  15.  sitting for 1 hour 4 3  16. Running on even ground 2 3  17. Running on uneven ground 1 3  18. Making sharp turns while  running fast 0 3  19. Hopping  1 3  20. Rolling over in bed 4 4  Score total:  42/80 68/80     COGNITION: Overall cognitive status: Within functional limits for tasks assessed     SENSATION: WFL    PALPATION: Patellar mobs WFL, no tenderness grossly R knee  LOWER EXTREMITY ROM:WFL for tasks assessed  Active ROM Right eval Left eval  Hip flexion    Hip extension    Hip abduction    Hip adduction    Hip internal rotation    Hip external rotation    Knee flexion    Knee extension    Ankle dorsiflexion    Ankle plantarflexion    Ankle inversion    Ankle eversion     (Blank rows = not tested) *= pain/symptoms  LOWER EXTREMITY MMT:  MMT Right eval  Left eval Right 12/16/24 Left 12/16/24  Hip flexion 4+ 4+ 5 5  Hip extension 4 4- 4+ 4+  Hip abduction 4 4- 4+ 4+  Hip adduction      Hip internal rotation      Hip external rotation      Knee flexion 5 4+* 5 5  Knee extension 4+* 4+ 5 5  Ankle dorsiflexion   5 5  Ankle plantarflexion      Ankle inversion      Ankle eversion       (Blank rows = not tested) *= pain/symptoms   FUNCTIONAL TESTS:  5 times sit to stand: 14.16 seconds without UE support, mild valgus R>L which increases with reps Stairs: alternating pattern 7 inch, no UE support, trendelenberg bilaterally, valgus with descending R>L     GAIT: Distance walked: 100 feet Assistive device utilized: None Level of assistance: Complete Independence Comments: trendelenburg bilaterally   Reassessment 12/16/24: 5 times sit to stand: 11.4 seconds without UE support, very mild valgus R>L, relies slightly LLE>RLE Stairs: alternating pattern 7 inch, no UE support, mildly decreased eccentric strength  TODAY'S TREATMENT:                                                                                                                              DATE:  12/30/24 Elliptical 5 minutes for dynamic warm up RDL 15# 3 x 10 Squat chair tap 15# 3 x 10 Lunge 2 x 10 Floor to stand transfers (lunge from airex) 1 x 15 Split squat 1 x 10 SL RDL 2 x 10  12/23/24 Elliptical 5 minutes for dynamic warm up Squat chair tap 10# 3 x 10 Forward step down 6 inch 2 x 10 Lunge 2 x 10 RDL 10# 3 x 10 SL RDL 3 x 10 Floor to stand transfers (lunge from airex) 1 x 5  12/16/24 Elliptical 5 minutes for dynamic warm up Reassessment  Discussion of objective findings and POC SL RDL 2 x 10 Lunge 1 x 10  12/09/24 Elliptical 5 minutes for dynamic warm up Lateral step  down 6 inch 2 x 10 Forward step down 6 inch 2 x 10 Curtsy step up  6 inch 2 x 10 RDL 10# kb 2 x 10  SL RDL 1 x 10  12/02/24 NuStep level 6 Les only x 5 min with PT discussing pain Wall  slides x 20 with concentration on neutral LE alignment Static lunge at sink x 15 Airex Hamstring curl sinkside 3 lbs x 20 Airex hip abduction with knee flexed 3 lbs x 20 with PT verbal and tactile cues for technique and to decrease cadence Prone 3 lb hamstring curl x 20 Prone hip ext 3 lbs x 20 with difficulty performing on LLE with need for PT verbal and tactile cues for technique to decrease compensations Prayer stretch 2x30 sec  11/18/24 Bike 5 minutes for dynamic warm up Squat chair taps 3 x 10 Step up 6 inch with contralateral knee drive 2 x 10 Lateral step down 6 inch 2 x 10 Lateral stepping in mini squat GTB at knees 6 x 15 feet Standing hip abduction GTB at knees 1 x 10 RDL 5# kb 3 x 10   11/11/24 LAQ 3# 10 x 5-10 second holds Squat chair taps 2 x 10 Step up 6 inch 2 x 10 Lateral step up 6 inch 2 x 10 Lateral stepping in mini squat 4 x 15 feet Standing hip abduction GTB at knees 3 x 10 RDL 5# kb 3 x 10     PATIENT EDUCATION:  Education details: Patient educated on exam findings, POC, scope of PT, HEP, relevant anatomy and biomechanics. 11/11/24: HEP 11/18/24: HEP, exercise mechanics 12/16/24: HEP, exercise mechanics, reassessment findings, POC Person educated: Patient Education method: Explanation, Demonstration, and Handouts Education comprehension: verbalized understanding, returned demonstration, verbal cues required, and tactile cues required  HOME EXERCISE PROGRAM: Access Code: [email or web address] URL: [email or web address] Date: 10/21/2024 Prepared by: Prentice Zacharie Portner  Exercises - Supine Bridge  - 1 x daily - 7 x weekly - 2 sets - 10 reps - Sidelying Hip Abduction  - 1 x daily - 7 x weekly - 2 sets - 10 reps - Seated Long Arc Quad  - 1 x daily - 7 x weekly - 10 reps - 5-10 second hold  ASSESSMENT:  CLINICAL IMPRESSION: Began session on elliptical for dynamic warm up. Patient tolerating increased resistance with previously completed exercises. Moderate  fatigue at EOS. Patient will continue to benefit from skilled physical therapy in order to improve function and reduce impairment.     OBJECTIVE IMPAIRMENTS: Abnormal gait, decreased activity tolerance, decreased balance, decreased endurance, decreased mobility, difficulty walking, decreased ROM, decreased strength, increased muscle spasms, impaired flexibility, improper body mechanics, and pain  ACTIVITY LIMITATIONS: lifting, bending, standing, squatting, stairs, transfers, locomotion level, and caring for others  PARTICIPATION LIMITATIONS: meal prep, cleaning, laundry, shopping, community activity, occupation, and yard work  PERSONAL FACTORS: Time since onset of injury/illness/exacerbation and 1-2 comorbidities: HTN, Right frontal meningoma resection 05/25/23 with LLE weaknes are also affecting patient's functional outcome.   REHAB POTENTIAL: Good  CLINICAL DECISION MAKING: Evolving/moderate complexity  EVALUATION COMPLEXITY: Moderate   GOALS: Goals reviewed with patient? Yes  SHORT TERM GOALS: Target date: 11/18/2024    Patient will be independent with HEP in order to improve functional outcomes. Baseline: Goal status: MET  2.  Patient will report at least 25% improvement in symptoms for improved quality of life. Baseline: Goal status: MET    LONG TERM GOALS: Target date: 12/16/2024    Patient will report at least 75%  improvement in symptoms for improved quality of life. Baseline:  Goal status: MET  2.  Patient will improve LEFS score by at least 10 points in order to indicate improved tolerance to activity. Baseline:  Goal status: MET  3.  Patient will be able to navigate stairs with reciprocal pattern without compensation in order to demonstrate improved LE strength. Baseline:  Goal status: INITIAL  4.   Patient will demonstrate grade of 4+/5 MMT grade in all tested musculature as evidence of improved strength to assist with stair ambulation and gait.  Baseline:   Goal status: MET  5.  Patient will be able to complete 5x STS in under 11.4 seconds in order to demonstrate improved functional strength. Baseline:  Goal status: MET 6.  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 Baseline:  Goal status: Initial    PLAN:  PT FREQUENCY: 1x/week  PT DURATION: 6 weeks  PLANNED INTERVENTIONS: 97164- PT Re-evaluation, 97110-Therapeutic exercises, 97530- Therapeutic activity, [id number]- Neuromuscular re-education, 97535- Self Care, [id number]- Manual therapy, [id number]- Gait training, [phone]- Orthotic Fit/training, [id number]- Canalith repositioning, [id number]- Aquatic Therapy, [phone]- Splinting, [phone]- Wound care (first 20 sq cm), 97598- Wound care (each additional 20 sq cm)Patient/Family education, Balance training, Stair training, Taping, Dry Needling, Joint mobilization, Joint manipulation, Spinal manipulation, Spinal mobilization, Scar mobilization, and DME instructions.  PLAN FOR NEXT SESSION: quad and glute strength, functional strength, hamstring strengthening   Prentice GORMAN Stains, PT, DPT 12/30/2024, 8:30 AM  For all possible CPT codes, reference the Planned Interventions line above.     Check all conditions that are expected to impact treatment: {Conditions expected to impact treatment:None of these apply   If treatment provided at initial evaluation, no treatment charged due to lack of authorization.       "

## 2025-01-03 ENCOUNTER — Inpatient Hospital Stay
Admission: RE | Admit: 2025-01-03 | Discharge: 2025-01-03 | Disposition: A | Payer: Self-pay | Source: Ambulatory Visit | Attending: Neurosurgery | Admitting: Neurosurgery

## 2025-01-03 ENCOUNTER — Other Ambulatory Visit: Payer: Self-pay

## 2025-01-03 DIAGNOSIS — Z049 Encounter for examination and observation for unspecified reason: Secondary | ICD-10-CM

## 2025-01-06 ENCOUNTER — Ambulatory Visit (HOSPITAL_BASED_OUTPATIENT_CLINIC_OR_DEPARTMENT_OTHER): Admitting: Rehabilitative and Restorative Service Providers"

## 2025-01-09 ENCOUNTER — Ambulatory Visit: Admitting: Neurosurgery

## 2025-01-13 ENCOUNTER — Encounter (HOSPITAL_BASED_OUTPATIENT_CLINIC_OR_DEPARTMENT_OTHER): Admitting: Physical Therapy

## 2025-01-20 ENCOUNTER — Encounter (HOSPITAL_BASED_OUTPATIENT_CLINIC_OR_DEPARTMENT_OTHER): Admitting: Physical Therapy

## 2025-01-27 ENCOUNTER — Encounter (HOSPITAL_BASED_OUTPATIENT_CLINIC_OR_DEPARTMENT_OTHER): Admitting: Physical Therapy

## 2025-02-12 ENCOUNTER — Ambulatory Visit: Admitting: Nurse Practitioner
